# Patient Record
Sex: Female | Born: 1940 | Race: White | Hispanic: No | Marital: Married
Health system: Southern US, Community
[De-identification: ages and names within clinical notes are randomized; demographics above are authoritative.]

## PROBLEM LIST (undated history)

## (undated) DIAGNOSIS — M722 Plantar fascial fibromatosis: Secondary | ICD-10-CM

## (undated) DIAGNOSIS — E785 Hyperlipidemia, unspecified: Secondary | ICD-10-CM

## (undated) DIAGNOSIS — I10 Essential (primary) hypertension: Secondary | ICD-10-CM

## (undated) DIAGNOSIS — K219 Gastro-esophageal reflux disease without esophagitis: Secondary | ICD-10-CM

## (undated) DIAGNOSIS — J449 Chronic obstructive pulmonary disease, unspecified: Secondary | ICD-10-CM

## (undated) DIAGNOSIS — M1990 Unspecified osteoarthritis, unspecified site: Secondary | ICD-10-CM

## (undated) DIAGNOSIS — J45909 Unspecified asthma, uncomplicated: Secondary | ICD-10-CM

## (undated) DIAGNOSIS — M8580 Other specified disorders of bone density and structure, unspecified site: Secondary | ICD-10-CM

## (undated) DIAGNOSIS — H269 Unspecified cataract: Secondary | ICD-10-CM

## (undated) DIAGNOSIS — Z8679 Personal history of other diseases of the circulatory system: Secondary | ICD-10-CM

## (undated) DIAGNOSIS — G5600 Carpal tunnel syndrome, unspecified upper limb: Secondary | ICD-10-CM

## (undated) HISTORY — DX: Carpal tunnel syndrome, unspecified upper limb: G56.00

## (undated) HISTORY — DX: Chronic obstructive pulmonary disease, unspecified: J44.9

## (undated) HISTORY — DX: Unspecified osteoarthritis, unspecified site: M19.90

## (undated) HISTORY — DX: Hyperlipidemia, unspecified: E78.5

## (undated) HISTORY — PX: TONSILLECTOMY: SUR1361

## (undated) HISTORY — DX: Unspecified asthma, uncomplicated: J45.909

## (undated) HISTORY — DX: Personal history of other diseases of the circulatory system: Z86.79

## (undated) HISTORY — DX: Essential (primary) hypertension: I10

## (undated) HISTORY — DX: Other specified disorders of bone density and structure, unspecified site: M85.80

## (undated) HISTORY — PX: OTHER SURGICAL HISTORY: SHX169

## (undated) HISTORY — DX: Unspecified cataract: H26.9

## (undated) HISTORY — DX: Plantar fascial fibromatosis: M72.2

## (undated) HISTORY — DX: Gastro-esophageal reflux disease without esophagitis: K21.9

---

## 1998-03-28 ENCOUNTER — Emergency Department (HOSPITAL_COMMUNITY): Admission: EM | Admit: 1998-03-28 | Discharge: 1998-03-28 | Payer: Self-pay | Admitting: Emergency Medicine

## 1998-05-08 ENCOUNTER — Observation Stay (HOSPITAL_COMMUNITY): Admission: AD | Admit: 1998-05-08 | Discharge: 1998-05-09 | Payer: Self-pay | Admitting: Internal Medicine

## 1998-05-08 ENCOUNTER — Encounter: Payer: Self-pay | Admitting: Internal Medicine

## 1998-05-21 ENCOUNTER — Other Ambulatory Visit: Admission: RE | Admit: 1998-05-21 | Discharge: 1998-05-21 | Payer: Self-pay | Admitting: Obstetrics and Gynecology

## 1999-01-19 HISTORY — PX: BREAST BIOPSY: SHX20

## 1999-05-25 ENCOUNTER — Other Ambulatory Visit: Admission: RE | Admit: 1999-05-25 | Discharge: 1999-05-25 | Payer: Self-pay | Admitting: Obstetrics and Gynecology

## 2000-01-18 ENCOUNTER — Encounter: Admission: RE | Admit: 2000-01-18 | Discharge: 2000-01-18 | Payer: Self-pay | Admitting: Family Medicine

## 2000-01-18 ENCOUNTER — Encounter: Payer: Self-pay | Admitting: Family Medicine

## 2000-05-27 ENCOUNTER — Other Ambulatory Visit: Admission: RE | Admit: 2000-05-27 | Discharge: 2000-05-27 | Payer: Self-pay | Admitting: Obstetrics and Gynecology

## 2001-05-17 ENCOUNTER — Ambulatory Visit (HOSPITAL_COMMUNITY): Admission: RE | Admit: 2001-05-17 | Discharge: 2001-05-17 | Payer: Self-pay | Admitting: Gastroenterology

## 2001-05-17 ENCOUNTER — Encounter (INDEPENDENT_AMBULATORY_CARE_PROVIDER_SITE_OTHER): Payer: Self-pay | Admitting: Specialist

## 2001-05-17 HISTORY — PX: OTHER SURGICAL HISTORY: SHX169

## 2001-07-04 ENCOUNTER — Other Ambulatory Visit: Admission: RE | Admit: 2001-07-04 | Discharge: 2001-07-04 | Payer: Self-pay | Admitting: Obstetrics and Gynecology

## 2002-07-17 ENCOUNTER — Other Ambulatory Visit: Admission: RE | Admit: 2002-07-17 | Discharge: 2002-07-17 | Payer: Self-pay | Admitting: Obstetrics and Gynecology

## 2003-01-30 ENCOUNTER — Ambulatory Visit (HOSPITAL_COMMUNITY): Admission: RE | Admit: 2003-01-30 | Discharge: 2003-01-30 | Payer: Self-pay | Admitting: Orthopedic Surgery

## 2003-08-14 ENCOUNTER — Other Ambulatory Visit: Admission: RE | Admit: 2003-08-14 | Discharge: 2003-08-14 | Payer: Self-pay | Admitting: Obstetrics and Gynecology

## 2004-02-11 ENCOUNTER — Ambulatory Visit: Payer: Self-pay | Admitting: Internal Medicine

## 2004-02-19 ENCOUNTER — Ambulatory Visit: Payer: Self-pay | Admitting: Internal Medicine

## 2004-06-22 ENCOUNTER — Ambulatory Visit: Payer: Self-pay | Admitting: Internal Medicine

## 2004-07-27 ENCOUNTER — Ambulatory Visit: Payer: Self-pay | Admitting: Internal Medicine

## 2004-09-16 ENCOUNTER — Other Ambulatory Visit: Admission: RE | Admit: 2004-09-16 | Discharge: 2004-09-16 | Payer: Self-pay | Admitting: Obstetrics and Gynecology

## 2004-09-30 ENCOUNTER — Ambulatory Visit: Payer: Self-pay | Admitting: Internal Medicine

## 2004-11-18 ENCOUNTER — Ambulatory Visit: Payer: Self-pay | Admitting: Internal Medicine

## 2004-12-23 ENCOUNTER — Ambulatory Visit: Payer: Self-pay | Admitting: Internal Medicine

## 2004-12-29 ENCOUNTER — Ambulatory Visit: Payer: Self-pay | Admitting: Internal Medicine

## 2005-02-09 ENCOUNTER — Ambulatory Visit: Payer: Self-pay | Admitting: Internal Medicine

## 2005-05-11 ENCOUNTER — Ambulatory Visit: Payer: Self-pay | Admitting: Internal Medicine

## 2005-08-10 ENCOUNTER — Ambulatory Visit: Payer: Self-pay | Admitting: Internal Medicine

## 2005-10-01 ENCOUNTER — Other Ambulatory Visit: Admission: RE | Admit: 2005-10-01 | Discharge: 2005-10-01 | Payer: Self-pay | Admitting: Obstetrics and Gynecology

## 2005-10-05 ENCOUNTER — Ambulatory Visit: Payer: Self-pay | Admitting: Internal Medicine

## 2005-10-11 ENCOUNTER — Ambulatory Visit: Payer: Self-pay | Admitting: Internal Medicine

## 2005-10-29 ENCOUNTER — Ambulatory Visit: Payer: Self-pay | Admitting: Family Medicine

## 2006-01-05 ENCOUNTER — Ambulatory Visit: Payer: Self-pay | Admitting: Internal Medicine

## 2006-04-19 ENCOUNTER — Ambulatory Visit: Payer: Self-pay | Admitting: Internal Medicine

## 2006-08-09 DIAGNOSIS — M1990 Unspecified osteoarthritis, unspecified site: Secondary | ICD-10-CM | POA: Insufficient documentation

## 2006-08-09 DIAGNOSIS — J4489 Other specified chronic obstructive pulmonary disease: Secondary | ICD-10-CM | POA: Insufficient documentation

## 2006-08-09 DIAGNOSIS — J449 Chronic obstructive pulmonary disease, unspecified: Secondary | ICD-10-CM

## 2006-08-09 DIAGNOSIS — J4520 Mild intermittent asthma, uncomplicated: Secondary | ICD-10-CM

## 2006-08-09 DIAGNOSIS — K219 Gastro-esophageal reflux disease without esophagitis: Secondary | ICD-10-CM

## 2006-08-19 DIAGNOSIS — I10 Essential (primary) hypertension: Secondary | ICD-10-CM | POA: Insufficient documentation

## 2006-08-19 DIAGNOSIS — E785 Hyperlipidemia, unspecified: Secondary | ICD-10-CM

## 2006-08-22 ENCOUNTER — Ambulatory Visit: Payer: Self-pay | Admitting: Internal Medicine

## 2006-08-22 DIAGNOSIS — G5600 Carpal tunnel syndrome, unspecified upper limb: Secondary | ICD-10-CM

## 2006-08-22 LAB — CONVERTED CEMR LAB
BUN: 14 mg/dL (ref 6–23)
CO2: 29 meq/L (ref 19–32)
Calcium: 9.8 mg/dL (ref 8.4–10.5)
Chloride: 107 meq/L (ref 96–112)
Cholesterol: 147 mg/dL (ref 0–200)
Creatinine, Ser: 0.5 mg/dL (ref 0.4–1.2)
GFR calc Af Amer: 159 mL/min
GFR calc non Af Amer: 131 mL/min
Glucose, Bld: 67 mg/dL — ABNORMAL LOW (ref 70–99)
HDL: 53.2 mg/dL (ref >39.0)
Hgb A1c MFr Bld: 5.7 % (ref 4.6–6.0)
LDL Cholesterol: 77 mg/dL (ref 0–99)
Potassium: 4.5 meq/L (ref 3.5–5.1)
Sodium: 143 meq/L (ref 135–145)
Total CHOL/HDL Ratio: 2.8
Triglycerides: 84 mg/dL (ref 0–149)
VLDL: 17 mg/dL (ref 0–40)

## 2006-09-12 ENCOUNTER — Encounter: Payer: Self-pay | Admitting: Internal Medicine

## 2006-09-19 DIAGNOSIS — G5600 Carpal tunnel syndrome, unspecified upper limb: Secondary | ICD-10-CM

## 2006-09-19 HISTORY — DX: Carpal tunnel syndrome, unspecified upper limb: G56.00

## 2006-09-19 HISTORY — PX: CARPAL TUNNEL RELEASE: SHX101

## 2006-10-14 ENCOUNTER — Ambulatory Visit (HOSPITAL_BASED_OUTPATIENT_CLINIC_OR_DEPARTMENT_OTHER): Admission: RE | Admit: 2006-10-14 | Discharge: 2006-10-14 | Payer: Self-pay | Admitting: Orthopedic Surgery

## 2006-10-24 ENCOUNTER — Telehealth: Payer: Self-pay | Admitting: *Deleted

## 2006-11-10 ENCOUNTER — Ambulatory Visit: Payer: Self-pay | Admitting: Internal Medicine

## 2006-11-14 ENCOUNTER — Telehealth: Payer: Self-pay | Admitting: Internal Medicine

## 2006-11-17 ENCOUNTER — Encounter: Payer: Self-pay | Admitting: Internal Medicine

## 2006-11-18 ENCOUNTER — Other Ambulatory Visit: Admission: RE | Admit: 2006-11-18 | Discharge: 2006-11-18 | Payer: Self-pay | Admitting: Obstetrics and Gynecology

## 2006-11-21 ENCOUNTER — Encounter: Payer: Self-pay | Admitting: Internal Medicine

## 2006-11-23 ENCOUNTER — Encounter: Payer: Self-pay | Admitting: Internal Medicine

## 2006-11-30 ENCOUNTER — Ambulatory Visit: Payer: Self-pay | Admitting: Internal Medicine

## 2006-11-30 LAB — CONVERTED CEMR LAB: Hgb A1c MFr Bld: 6.3 % — ABNORMAL HIGH (ref 4.6–6.0)

## 2007-03-23 ENCOUNTER — Telehealth: Payer: Self-pay | Admitting: Internal Medicine

## 2007-03-27 ENCOUNTER — Ambulatory Visit: Payer: Self-pay | Admitting: Internal Medicine

## 2007-03-27 LAB — CONVERTED CEMR LAB
ALT: 15 units/L (ref 0–35)
AST: 21 units/L (ref 0–37)
Albumin: 3.7 g/dL (ref 3.5–5.2)
Alkaline Phosphatase: 60 units/L (ref 39–117)
Bilirubin, Direct: 0.1 mg/dL (ref 0.0–0.3)
Cholesterol: 116 mg/dL (ref 0–200)
HDL: 44.8 mg/dL (ref >39.0)
Hgb A1c MFr Bld: 6.5 % — ABNORMAL HIGH (ref 4.6–6.0)
LDL Cholesterol: 57 mg/dL (ref 0–99)
Total Bilirubin: 0.7 mg/dL (ref 0.3–1.2)
Total CHOL/HDL Ratio: 2.6
Total Protein: 6.5 g/dL (ref 6.0–8.3)
Triglycerides: 71 mg/dL (ref 0–149)
VLDL: 14 mg/dL (ref 0–40)

## 2007-04-19 ENCOUNTER — Ambulatory Visit: Payer: Self-pay | Admitting: Internal Medicine

## 2007-05-06 ENCOUNTER — Ambulatory Visit: Payer: Self-pay | Admitting: Family Medicine

## 2007-05-06 DIAGNOSIS — J209 Acute bronchitis, unspecified: Secondary | ICD-10-CM

## 2007-05-16 ENCOUNTER — Telehealth: Payer: Self-pay | Admitting: Internal Medicine

## 2007-05-30 ENCOUNTER — Ambulatory Visit (HOSPITAL_BASED_OUTPATIENT_CLINIC_OR_DEPARTMENT_OTHER): Admission: RE | Admit: 2007-05-30 | Discharge: 2007-05-30 | Payer: Self-pay | Admitting: Orthopedic Surgery

## 2007-07-03 ENCOUNTER — Telehealth: Payer: Self-pay | Admitting: Internal Medicine

## 2007-08-16 ENCOUNTER — Ambulatory Visit: Payer: Self-pay | Admitting: Internal Medicine

## 2007-08-16 LAB — CONVERTED CEMR LAB
BUN: 14 mg/dL (ref 6–23)
Basophils Absolute: 0 10*3/uL (ref 0.0–0.1)
Basophils Relative: 0.3 % (ref 0.0–3.0)
CO2: 28 meq/L (ref 19–32)
Calcium: 9.7 mg/dL (ref 8.4–10.5)
Chloride: 104 meq/L (ref 96–112)
Cholesterol: 105 mg/dL (ref 0–200)
Creatinine, Ser: 0.6 mg/dL (ref 0.4–1.2)
Eosinophils Absolute: 0.1 10*3/uL (ref 0.0–0.7)
Eosinophils Relative: 1 % (ref 0.0–5.0)
GFR calc Af Amer: 128 mL/min
GFR calc non Af Amer: 106 mL/min
Glucose, Bld: 80 mg/dL (ref 70–99)
HCT: 37 % (ref 36.0–46.0)
HDL: 51.8 mg/dL (ref >39.0)
Hemoglobin: 12.6 g/dL (ref 12.0–15.0)
Hgb A1c MFr Bld: 6.6 % — ABNORMAL HIGH (ref 4.6–6.0)
LDL Cholesterol: 44 mg/dL (ref 0–99)
Lymphocytes Relative: 29.6 % (ref 12.0–46.0)
MCHC: 34.2 g/dL (ref 30.0–36.0)
MCV: 92.1 fL (ref 78.0–100.0)
Monocytes Absolute: 0.5 10*3/uL (ref 0.1–1.0)
Monocytes Relative: 7.1 % (ref 3.0–12.0)
Neutro Abs: 4.5 10*3/uL (ref 1.4–7.7)
Neutrophils Relative %: 62 % (ref 43.0–77.0)
Platelets: 229 10*3/uL (ref 150–400)
Potassium: 4.1 meq/L (ref 3.5–5.1)
RBC: 4.02 M/uL (ref 3.87–5.11)
RDW: 13.5 % (ref 11.5–14.6)
Sodium: 140 meq/L (ref 135–145)
Total CHOL/HDL Ratio: 2
Triglycerides: 44 mg/dL (ref 0–149)
VLDL: 9 mg/dL (ref 0–40)
WBC: 7.2 10*3/uL (ref 4.5–10.5)

## 2007-09-19 ENCOUNTER — Telehealth: Payer: Self-pay | Admitting: Internal Medicine

## 2007-10-05 ENCOUNTER — Encounter: Payer: Self-pay | Admitting: Internal Medicine

## 2007-11-20 ENCOUNTER — Encounter: Payer: Self-pay | Admitting: Internal Medicine

## 2007-12-04 ENCOUNTER — Encounter: Payer: Self-pay | Admitting: Internal Medicine

## 2007-12-05 ENCOUNTER — Other Ambulatory Visit: Admission: RE | Admit: 2007-12-05 | Discharge: 2007-12-05 | Payer: Self-pay | Admitting: Obstetrics and Gynecology

## 2007-12-08 ENCOUNTER — Ambulatory Visit: Payer: Self-pay | Admitting: Internal Medicine

## 2007-12-08 DIAGNOSIS — L570 Actinic keratosis: Secondary | ICD-10-CM

## 2007-12-08 DIAGNOSIS — M722 Plantar fascial fibromatosis: Secondary | ICD-10-CM

## 2007-12-08 DIAGNOSIS — T887XXA Unspecified adverse effect of drug or medicament, initial encounter: Secondary | ICD-10-CM

## 2007-12-08 LAB — CONVERTED CEMR LAB
ALT: 20 units/L (ref 0–35)
AST: 25 units/L (ref 0–37)
Albumin: 4.2 g/dL (ref 3.5–5.2)
Alkaline Phosphatase: 71 units/L (ref 39–117)
BUN: 18 mg/dL (ref 6–23)
Basophils Absolute: 0 10*3/uL (ref 0.0–0.1)
Basophils Relative: 0.2 % (ref 0.0–3.0)
Bilirubin, Direct: 0.2 mg/dL (ref 0.0–0.3)
CO2: 29 meq/L (ref 19–32)
Calcium: 10.8 mg/dL — ABNORMAL HIGH (ref 8.4–10.5)
Chloride: 105 meq/L (ref 96–112)
Creatinine, Ser: 0.7 mg/dL (ref 0.4–1.2)
Eosinophils Absolute: 0 10*3/uL (ref 0.0–0.7)
Eosinophils Relative: 0.1 % (ref 0.0–5.0)
GFR calc Af Amer: 107 mL/min
GFR calc non Af Amer: 88 mL/min
Glucose, Bld: 145 mg/dL — ABNORMAL HIGH (ref 70–99)
HCT: 37.4 % (ref 36.0–46.0)
Hemoglobin: 12.9 g/dL (ref 12.0–15.0)
Hgb A1c MFr Bld: 6.6 % — ABNORMAL HIGH (ref 4.6–6.0)
Lymphocytes Relative: 14.6 % (ref 12.0–46.0)
MCHC: 34.6 g/dL (ref 30.0–36.0)
MCV: 92 fL (ref 78.0–100.0)
Monocytes Absolute: 0.5 10*3/uL (ref 0.1–1.0)
Monocytes Relative: 5.4 % (ref 3.0–12.0)
Neutro Abs: 8.1 10*3/uL — ABNORMAL HIGH (ref 1.4–7.7)
Neutrophils Relative %: 79.7 % — ABNORMAL HIGH (ref 43.0–77.0)
Platelets: 261 10*3/uL (ref 150–400)
Potassium: 4.2 meq/L (ref 3.5–5.1)
RBC: 4.07 M/uL (ref 3.87–5.11)
RDW: 13.3 % (ref 11.5–14.6)
Sodium: 145 meq/L (ref 135–145)
Total Bilirubin: 0.8 mg/dL (ref 0.3–1.2)
Total Protein: 7.2 g/dL (ref 6.0–8.3)
WBC: 10.1 10*3/uL (ref 4.5–10.5)

## 2007-12-18 ENCOUNTER — Encounter: Payer: Self-pay | Admitting: Internal Medicine

## 2008-01-19 DIAGNOSIS — M722 Plantar fascial fibromatosis: Secondary | ICD-10-CM

## 2008-01-19 HISTORY — DX: Plantar fascial fibromatosis: M72.2

## 2008-04-08 ENCOUNTER — Ambulatory Visit: Payer: Self-pay | Admitting: Internal Medicine

## 2008-04-08 DIAGNOSIS — M810 Age-related osteoporosis without current pathological fracture: Secondary | ICD-10-CM

## 2008-04-08 LAB — CONVERTED CEMR LAB
BUN: 15 mg/dL (ref 6–23)
CO2: 30 meq/L (ref 19–32)
Calcium: 10 mg/dL (ref 8.4–10.5)
Chloride: 106 meq/L (ref 96–112)
Cholesterol: 120 mg/dL (ref 0–200)
Creatinine, Ser: 0.6 mg/dL (ref 0.4–1.2)
Creatinine,U: 108.9 mg/dL
Direct LDL: 48.7 mg/dL
GFR calc non Af Amer: 105.48 mL/min (ref >60)
Glucose, Bld: 106 mg/dL — ABNORMAL HIGH (ref 70–99)
HDL: 57.2 mg/dL (ref >39.00)
Hgb A1c MFr Bld: 6.7 % — ABNORMAL HIGH (ref 4.6–6.5)
Microalb Creat Ratio: 5.5 mg/g (ref 0.0–30.0)
Microalb, Ur: 0.6 mg/dL (ref 0.0–1.9)
Potassium: 4.2 meq/L (ref 3.5–5.1)
Sodium: 145 meq/L (ref 135–145)
TSH: 1.61 microintl units/mL (ref 0.35–5.50)

## 2008-07-09 ENCOUNTER — Ambulatory Visit: Payer: Self-pay | Admitting: Internal Medicine

## 2008-10-14 ENCOUNTER — Telehealth: Payer: Self-pay | Admitting: Internal Medicine

## 2008-11-25 ENCOUNTER — Ambulatory Visit: Payer: Self-pay | Admitting: Internal Medicine

## 2008-11-25 ENCOUNTER — Encounter (INDEPENDENT_AMBULATORY_CARE_PROVIDER_SITE_OTHER): Payer: Self-pay | Admitting: *Deleted

## 2008-11-25 LAB — CONVERTED CEMR LAB
ALT: 19 units/L (ref 0–35)
AST: 19 units/L (ref 0–37)
Albumin: 3.9 g/dL (ref 3.5–5.2)
Alkaline Phosphatase: 58 units/L (ref 39–117)
BUN: 16 mg/dL (ref 6–23)
CO2: 28 meq/L (ref 19–32)
Chloride: 107 meq/L (ref 96–112)
Glucose, Bld: 105 mg/dL — ABNORMAL HIGH (ref 70–99)
Hgb A1c MFr Bld: 6.7 % — ABNORMAL HIGH (ref 4.6–6.5)
Potassium: 4.9 meq/L (ref 3.5–5.1)
Sodium: 144 meq/L (ref 135–145)
TSH: 2.11 microintl units/mL (ref 0.35–5.50)
Total Protein: 7.6 g/dL (ref 6.0–8.3)
VLDL: 11.4 mg/dL (ref 0.0–40.0)

## 2008-12-04 ENCOUNTER — Ambulatory Visit: Payer: Self-pay | Admitting: Internal Medicine

## 2009-04-02 ENCOUNTER — Ambulatory Visit: Payer: Self-pay | Admitting: Internal Medicine

## 2009-04-02 LAB — CONVERTED CEMR LAB: Hgb A1c MFr Bld: 6.8 % — ABNORMAL HIGH (ref 4.6–6.5)

## 2009-04-09 ENCOUNTER — Ambulatory Visit: Payer: Self-pay | Admitting: Internal Medicine

## 2009-04-09 ENCOUNTER — Telehealth: Payer: Self-pay | Admitting: Internal Medicine

## 2009-04-09 LAB — CONVERTED CEMR LAB
BUN: 13 mg/dL (ref 6–23)
GFR calc non Af Amer: 88.03 mL/min (ref >60)
Magnesium: 2 mg/dL (ref 1.5–2.5)
Potassium: 4.8 meq/L (ref 3.5–5.1)
Sodium: 142 meq/L (ref 135–145)

## 2009-08-06 ENCOUNTER — Encounter: Payer: Self-pay | Admitting: Internal Medicine

## 2009-10-06 ENCOUNTER — Ambulatory Visit: Payer: Self-pay | Admitting: Internal Medicine

## 2009-10-06 LAB — CONVERTED CEMR LAB
AST: 29 units/L (ref 0–37)
Albumin: 3.8 g/dL (ref 3.5–5.2)
Alkaline Phosphatase: 60 units/L (ref 39–117)
CO2: 29 meq/L (ref 19–32)
Calcium: 10.3 mg/dL (ref 8.4–10.5)
Cholesterol: 123 mg/dL (ref 0–200)
Glucose, Bld: 114 mg/dL — ABNORMAL HIGH (ref 70–99)
HDL: 47.5 mg/dL (ref >39.00)
Sodium: 142 meq/L (ref 135–145)
Total Protein: 6.5 g/dL (ref 6.0–8.3)

## 2009-10-13 ENCOUNTER — Ambulatory Visit: Payer: Self-pay | Admitting: Internal Medicine

## 2009-11-07 ENCOUNTER — Telehealth: Payer: Self-pay | Admitting: Internal Medicine

## 2009-11-27 ENCOUNTER — Encounter: Payer: Self-pay | Admitting: Internal Medicine

## 2009-12-23 ENCOUNTER — Ambulatory Visit: Payer: Self-pay | Admitting: Internal Medicine

## 2009-12-23 LAB — CONVERTED CEMR LAB: Hgb A1c MFr Bld: 7.7 % — ABNORMAL HIGH (ref 4.6–6.5)

## 2010-01-05 ENCOUNTER — Ambulatory Visit: Payer: Self-pay | Admitting: Internal Medicine

## 2010-01-05 DIAGNOSIS — E119 Type 2 diabetes mellitus without complications: Secondary | ICD-10-CM | POA: Insufficient documentation

## 2010-02-07 ENCOUNTER — Encounter: Payer: Self-pay | Admitting: Orthopedic Surgery

## 2010-02-19 NOTE — Progress Notes (Signed)
Summary: Pt called and said that she rcvd two orders of Lipitor  Phone Note Call from Patient Call back at Home Phone [phone]   Caller: Patient Reason for Call: Acute Illness Summary of Call: Pt called and said that she rcv a whole new order of Lipitor from [facility] and she called [facility] and was told, that Dr. Lovell Sheehan office sent in two scripts for this med. Pt is having to pay twice. Pls call.  Initial call taken by: Lucy Antigua,  November 07, 2009 3:53 PM  Follow-up for Phone Call        talked with pt Follow-up by: Willy Eddy, LPN,  November 07, 2009 4:11 PM

## 2010-02-19 NOTE — Letter (Signed)
Summary: Eye Exam/[facility]  Eye Exam/[facility]   Imported By: Maryln Gottron 12/03/2009 13:43:46  _____________________________________________________________________  External Attachment:    Type:   Image     Comment:   External Document

## 2010-02-19 NOTE — Assessment & Plan Note (Signed)
Summary: 3 month rov/njr   Vital Signs:  Patient profile:   70 year old female Height:      64 inches Weight:      202 pounds BMI:     34.80 Temp:     98.2 degrees F oral Pulse rate:   72 / minute Resp:     14 per minute BP sitting:   140 / 74  (left arm)  Vitals Entered By: Willy Eddy, LPN (January 05, 2010 10:47 AM) CC: roa labs, Type 2 diabetes mellitus follow-up Is Patient Diabetic? No   Primary Care Harvis Mabus:  Stacie Glaze MD  CC:  roa labs and Type 2 diabetes mellitus follow-up.  History of Present Illness: The pt presents for follow up for DM and HTN and asthma  Hyperlipidemia Follow-Up      This is a 70 year old woman who presents for Hyperlipidemia follow-up.  The patient denies muscle aches, GI upset, abdominal pain, flushing, itching, constipation, diarrhea, and fatigue.  The patient denies the following symptoms: chest pain/pressure, exercise intolerance, dypsnea, palpitations, syncope, and pedal edema.  Compliance with medications (by patient report) has been near 100%.  Dietary compliance has been good.  The patient reports exercising occasionally.  Adjunctive measures currently used by the patient include weight reduction.    Type 2 Diabetes Mellitus Follow-Up      The patient is also here for Type 2 diabetes mellitus follow-up.  The patient denies polyuria, polydipsia, blurred vision, self managed hypoglycemia, hypoglycemia requiring help, weight loss, weight gain, and numbness of extremities.  The patient denies the following symptoms: neuropathic pain, chest pain, vomiting, orthostatic symptoms, poor wound healing, intermittent claudication, vision loss, and foot ulcer.  Since the last visit the patient reports good dietary compliance, compliance with medications, and not exercising regularly.  Since the last visit, the patient reports having had eye care by an ophthalmologist.    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) 7)  Family History Diabetes 1st Degree Relative  (ICD-V18.0) 8)  Hypertension  (ICD-401.9) 9)  Hyperlipidemia  (ICD-272.4) 10)  Diabetes Mellitus, Type II  (ICD-250.00) 11)  Osteoarthritis  (ICD-715.90) 12)  Gerd  (ICD-530.81) 13)  COPD  (ICD-496) 14)  Asthma  (ICD-493.90)  Current Problems (verified): 1)  Osteopenia  (ICD-733.90) 2)  Uns Advrs Eff Uns Rx Medicinal&biological Sbstnc  (ICD-995.20) 3)  Actinic Keratosis  (ICD-702.0) 4)  Plantar Fasciitis, Left  (ICD-728.71) 5)  Acute Bronchitis  (ICD-466.0) 6)  Carpal Tunnel Syndrome, 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 One Puff Twice A Day 2)  Amaryl 2 Mg  Tabs (Glimepiride) .... One Twice A Day 3)  Zantac 150 Mg  Caps (Ranitidine Hcl) .... One Twice A Day 4)  Lipitor 20 Mg  Tabs (Atorvastatin  Calcium) .... Once Daily 5)  Vitamin D 78469 Unit  Caps (Ergocalciferol) .... Every Week 6)  Bayer Aspirin 325 Mg  Tabs (Aspirin) .... Once Daily 7)  Multivitamins   Tabs (Multiple Vitamin) .... Once Daily 8)  Caltrate 600+d 600-400 Mg-Unit  Tabs (Calcium Carbonate-Vitamin D) .... One Twice A Day 9)  Citrucel 500 Mg  Tabs (Methylcellulose (Laxative)) .... ,once Daily 10)  Fish Oil 1000 Mg  Caps (Omega-3 Fatty Acids) .... Two Times A Day 11)   Proair Hfa 108 (90 Base) Mcg/act  Aers (Albuterol Sulfate) .... Use As Directed 12)  Ramipril 10 Mg  Caps (Ramipril) .... One By Mouth Daily 13)  Janumet 50-1000 Mg Tabs (Sitagliptin-Metformin Hcl) .... One By Mouth Two Times A  Day (Replaces The Metformin and The Januvia) 14)  Potassium 99 Mg Tabs (Potassium) .... Two Times A Day  Current Medications (verified): 1)  Advair Diskus 100-50 Mcg/dose  Misc (Fluticasone-Salmeterol) .... One Puff Twice A Day 2)  Amaryl 2 Mg  Tabs (Glimepiride) .... One Twice A Day 3)  Zantac 150 Mg  Caps (Ranitidine Hcl) .... One Twice A Day 4)  Lipitor 20 Mg  Tabs (Atorvastatin Calcium) .... Once Daily 5)  Vitamin D 62952 Unit  Caps (Ergocalciferol) .... Every Week 6)  Bayer Aspirin 325 Mg  Tabs (Aspirin) .... Once Daily 7)  Multivitamins   Tabs (Multiple Vitamin) .... Once Daily 8)  Caltrate 600+d 600-400 Mg-Unit  Tabs (Calcium Carbonate-Vitamin D) .... One Twice A Day 9)  Citrucel 500 Mg  Tabs (Methylcellulose (Laxative)) .... ,once Daily 10)  Fish Oil 1000 Mg  Caps (Omega-3 Fatty Acids) .... Two Times A Day 11)  Proair Hfa 108 (90 Base) Mcg/act  Aers (Albuterol Sulfate) .... Use As Directed 12)  Ramipril 10 Mg  Caps (Ramipril) .... One By Mouth Daily 13)  Janumet 50-1000 Mg Tabs (Sitagliptin-Metformin Hcl) .... One By Mouth Two Times A  Day (Replaces The Metformin and The Januvia) 14)  Potassium 99 Mg Tabs (Potassium) .... Two Times A Day  Allergies (verified): 1)  ! Sulfa 2)  ! Codeine  Past History:  Family History: Last updated: 08/22/2006 father had MI at 47 Mother dies from CVD at 33 Family History Diabetes 1st degree relative  Social History: Last updated: 08/22/2006 Married Never Smoked  Risk Factors: Exercise: yes (11/30/2006)  Risk Factors: Smoking Status: never (01/05/2010)  Past medical, surgical, family and social histories (including risk factors) reviewed, and no changes noted (except as noted below).  Past Medical  History: Reviewed history from 08/22/2006 and no changes required. Asthma COPD GERD Osteoarthritis Diabetes mellitus, type II Hyperlipidemia Hypertension hx of AF  Past Surgical History: Reviewed history from 08/22/2006 and no changes required. Colonoscopy-05/17/2001 Tonsillectomy Oophorectomy cyst Cardiac ablation in2001 for AF  Family History: Reviewed history from 08/22/2006 and no changes required. father had MI at 9 Mother dies from CVD at 88 Family History Diabetes 1st degree relative  Social History: Reviewed history from 08/22/2006 and no changes required. Married Never Smoked  Review of Systems  The patient denies anorexia, fever, weight loss, weight gain, vision loss, decreased hearing, hoarseness, chest pain, syncope, dyspnea on exertion, peripheral edema, prolonged cough, headaches, hemoptysis, abdominal pain, melena, hematochezia, severe indigestion/heartburn, hematuria, incontinence, genital sores, muscle weakness, suspicious skin lesions, transient blindness, difficulty walking, depression, unusual weight change, abnormal bleeding, enlarged lymph nodes, angioedema, and breast masses.    Physical Exam  General:  Well-developed,well-nourished,in no acute distress; alert,appropriate and cooperative throughout examination Head:  Normocephalic  and atraumatic without obvious abnormalities. No apparent alopecia or balding. Eyes:  No corneal or conjunctival inflammation noted. EOMI. Perrla. Funduscopic exam benign, without hemorrhages, exudates or papilledema. Vision grossly normal. Ears:  R ear normal and L ear normal.   Nose:  no external deformity and no nasal discharge.   Mouth:  Oral mucosa and oropharynx without lesions or exudates.  Teeth in good repair. Neck:  No deformities, masses, or tenderness noted. Lungs:  soft wheezes and rhonchi, no rales Heart:  Normal rate and regular rhythm. S1 and S2 normal without gallop, murmur, click, rub or other extra  sounds. Abdomen:  soft, normal bowel sounds, and distended.  soft.   Msk:  No deformity or scoliosis noted of thoracic or lumbar spine.   Extremities:  No clubbing, cyanosis, edema, or deformity noted with normal full range of motion of all joints.    Diabetes Management Exam:    Eye Exam:       Eye Exam done elsewhere          Date: 11/27/2009          Results: cataract          Done by: shapiro   Impression & Recommendations:  Problem # 1:  DIAB W/OTH MANIFESTS TYPE II/UNS TYPE UNCNTRL (ICD-250.82) Assessment Improved  Her updated medication list for this problem includes:    Amaryl 2 Mg Tabs (Glimepiride) ..... One twice a day    Bayer Aspirin 325 Mg Tabs (Aspirin) ..... Once daily    Ramipril 10 Mg Caps (Ramipril) ..... One by mouth daily    Janumet 50-1000 Mg Tabs (Sitagliptin-metformin hcl) ..... One by mouth two times a  day (replaces the metformin and the [place])  Labs Reviewed: Creat: 0.7 (10/06/2009)     Last Eye Exam: cataract (11/27/2009) Reviewed HgBA1c results: 7.7 (12/23/2009)  8.3 (10/06/2009)  Problem # 2:  HYPERTENSION (ICD-401.9) Assessment: Unchanged  Her updated medication list for this problem includes:    Ramipril 10 Mg Caps (Ramipril) ..... One by mouth daily  BP today: 140/74 Prior BP: 138/74 (10/13/2009)  Prior 10 Yr Risk Heart Disease: 15 % (10/13/2009)  Labs Reviewed: K+: 4.7 (10/06/2009) Creat: : 0.7 (10/06/2009)   Chol: 123 (10/06/2009)   HDL: 47.50 (10/06/2009)   LDL: 56 (10/06/2009)   TG: 98.0 (10/06/2009)  Problem # 3:  HYPERLIPIDEMIA (ICD-272.4)  Her updated medication list for this problem includes:    Lipitor 20 Mg Tabs (Atorvastatin calcium) ..... Once daily  Labs Reviewed: SGOT: 29 (10/06/2009)   SGPT: 34 (10/06/2009)  Prior 10 Yr Risk Heart Disease: 15 % (10/13/2009)   HDL:47.50 (10/06/2009), 56.90 (11/25/2008)  LDL:56 (10/06/2009), 53 (11/25/2008)  Chol:123 (10/06/2009), 121 (11/25/2008)  Trig:98.0 (10/06/2009), 57.0  (11/25/2008)  Complete Medication List: 1)  Advair Diskus 100-50 Mcg/dose Misc (Fluticasone-salmeterol) .... One puff twice a day 2)  Amaryl 2 Mg Tabs (Glimepiride) .... One twice a day 3)  Zantac 150 Mg Caps (Ranitidine hcl) .... One twice a day 4)  Lipitor 20 Mg Tabs (Atorvastatin calcium) .... Once daily 5)  Vitamin D 15176 Unit Caps (Ergocalciferol) .... Every week 6)  Bayer Aspirin 325 Mg Tabs (Aspirin) .... Once daily 7)  Multivitamins Tabs (Multiple vitamin) .... Once daily 8)  Caltrate 600+d 600-400 Mg-unit Tabs (Calcium carbonate-vitamin d) .... One twice a day 9)  Citrucel 500 Mg Tabs (Methylcellulose (laxative)) .... ,once daily 10)  Fish Oil 1000 Mg Caps (Omega-3 fatty acids) .... Two times a day 11)  Proair Hfa 108 (90  Base) Mcg/act Aers (Albuterol sulfate) .... Use as directed 12)  Ramipril 10 Mg Caps (Ramipril) .... One by mouth daily 13)  Janumet 50-1000 Mg Tabs (Sitagliptin-metformin hcl) .... One by mouth two times a  day (replaces the metformin and the januvia) 14)  Potassium 99 Mg Tabs (Potassium) .... Two times a day  Patient Instructions: 1)  Please schedule a follow-up appointment in 4 months. 2)  HbgA1C prior to visit, ICD-9: 250.00 Prescriptions: PROAIR HFA 108 (90 BASE) MCG/ACT  AERS (ALBUTEROL SULFATE) Use as directed  #4 x 3   Entered and Authorized by:   Stacie Glaze MD   Signed by:   Stacie Glaze MD on 01/05/2010   Method used:   Print then Give to Patient   RxID:   [id number]

## 2010-02-19 NOTE — Medication Information (Signed)
Summary: Order for Diabetes Testing Supplies  Order for Diabetes Testing Supplies   Imported By: Maryln Gottron 08/08/2009 13:36:24  _____________________________________________________________________  External Attachment:    Type:   Image     Comment:   External Document

## 2010-02-19 NOTE — Progress Notes (Signed)
Summary: Pt req copy of lab works  Phone Note Call from Patient Call back at [employer] [phone]   Caller: Patient Summary of Call: Pt req to get copy of labs that were done. Initial call taken by: Lucy Antigua,  April 09, 2009 3:23 PM  Follow-up for Phone Call        pt told labs up front Follow-up by: Willy Eddy, LPN,  April 09, 2009 4:13 PM

## 2010-02-19 NOTE — Assessment & Plan Note (Signed)
Summary: 6 month follow up/cjr   Vital Signs:  Patient profile:   70 year old female Height:      64 inches Weight:      204 pounds BMI:     35.14 Temp:     98.2 degrees F oral Pulse rate:   72 / minute Pulse rhythm:   regular Resp:     14 per minute BP sitting:   138 / 74  (left arm)  Vitals Entered By: Willy Eddy, LPN (October 13, 2009 9:15 AM) CC: roa labs, Hypertension Management Is Patient Diabetic? Yes Did you bring your meter with you today? No   Primary Care Mahnoor Mathisen:  Stacie Glaze MD  CC:  roa labs and Hypertension Management.  History of Present Illness: pt recongnized that her diet has been poor she is abherant with medications she has been taking both her hypertension and lipids meds failthfully she has not symptoms to suggest increased CV risks   Follow-Up Visit      This is a 70 year old woman who presents for Follow-up visit.  The patient denies chest pain, palpitations, dizziness, syncope, low blood sugar symptoms, high blood sugar symptoms, edema, SOB, DOE, PND, and orthopnea.  Since the last visit the patient notes no new problems or concerns.  The patient reports taking meds as prescribed.  When questioned about possible medication side effects, the patient notes none.    Hypertension History:      She denies headache, chest pain, palpitations, dyspnea with exertion, orthopnea, PND, peripheral edema, visual symptoms, neurologic problems, syncope, and side effects from treatment.        Positive major cardiovascular risk factors include female age 67 years old or older, diabetes, hyperlipidemia, and hypertension.  Negative major cardiovascular risk factors include non-tobacco-user status.     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) 7)  Family History Diabetes 1st Degree Relative  (ICD-V18.0) 8)  Hypertension  (ICD-401.9) 9)  Hyperlipidemia  (ICD-272.4) 10)  Diabetes Mellitus, Type II  (ICD-250.00) 11)  Osteoarthritis  (ICD-715.90) 12)  Gerd  (ICD-530.81) 13)  COPD  (ICD-496) 14)  Asthma  (ICD-493.90)  Current Problems (verified): 1)  Osteopenia  (ICD-733.90) 2)  Uns Advrs Eff Uns Rx Medicinal&biological Sbstnc  (ICD-995.20) 3)  Actinic Keratosis  (ICD-702.0) 4)  Plantar Fasciitis, Left  (ICD-728.71) 5)  Acute Bronchitis  (ICD-466.0) 6)  Carpal Tunnel Syndrome, 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 One Puff Twice A Day 2)  Metformin Hcl 500 Mg  Tb24 (Metformin Hcl) .... One in Am and Two in Pm 3)  Amaryl 2 Mg  Tabs (Glimepiride) .... One Twice A Day 4)  Zantac 150 Mg  Caps (Ranitidine Hcl) .... One Twice A Day 5)  Lipitor 20 Mg  Tabs (Atorvastatin Calcium) .... Once Daily 6)  Vitamin D [address] Unit  Caps (Ergocalciferol) .... Every Week 7)  Bayer Aspirin 325 Mg  Tabs (Aspirin) .... Once Daily 8)  Multivitamins   Tabs (Multiple Vitamin) .... Once Daily 9)  Caltrate 600+d 600-400 Mg-Unit  Tabs (Calcium Carbonate-Vitamin D) .... One Twice A Day 10)  Citrucel 500 Mg  Tabs (Methylcellulose (Laxative)) .... ,once Daily 11)  Fish Oil 1000 Mg  Caps (Omega-3 Fatty Acids) .... Two Times A Day 12)  Proair Hfa 108 (90 Base) Mcg/act  Aers (Albuterol Sulfate) .... Use As Directed 13)  Ramipril 10 Mg   Caps (Ramipril) .... One By Mouth Daily 14)  Cvs Potassium Gluconate 2 Meq Tabs (Potassium Gluconate) .Marland Kitchen.. 1 Two Times A Day 15)  Januvia 100 Mg Tabs (Sitagliptin Phosphate) .... One By Mouth Dialy 16)  Potassium 99 Mg Tabs (Potassium) .... Two Times A Day  Current Medications (verified): 1)  Advair Diskus 100-50 Mcg/dose  Misc (Fluticasone-Salmeterol) .... One Puff Twice A Day 2)  Amaryl 2 Mg  Tabs (Glimepiride) .... One Twice A Day 3)  Zantac 150 Mg  Caps (Ranitidine Hcl) .... One Twice A Day 4)  Lipitor 20 Mg  Tabs (Atorvastatin Calcium) .... Once Daily 5)  Vitamin D 60454 Unit  Caps (Ergocalciferol) .... Every Week 6)  Bayer Aspirin 325 Mg  Tabs (Aspirin) .... Once Daily 7)  Multivitamins   Tabs (Multiple Vitamin) .... Once Daily 8)  Caltrate 600+d 600-400 Mg-Unit  Tabs (Calcium Carbonate-Vitamin D) .... One Twice A Day 9)  Citrucel 500 Mg  Tabs (Methylcellulose (Laxative)) .... ,once Daily 10)  Fish Oil 1000 Mg  Caps (Omega-3 Fatty Acids) .... Two Times A Day 11)  Proair Hfa 108 (90 Base) Mcg/act  Aers (Albuterol Sulfate) .... Use As Directed 12)  Ramipril 10 Mg  Caps (Ramipril) .... One By Mouth Daily 13)  Janumet 50-1000 Mg Tabs (Sitagliptin-Metformin Hcl) .... One By Mouth Two Times A  Day (Replaces The Metformin and The Januvia) 14)  Potassium 99 Mg Tabs (Potassium) .... Two Times A Day  Allergies (verified): 1)  ! Sulfa 2)  ! Codeine  Past History:  Family History: Last updated: 08/22/2006 father had MI at 21 Mother dies from CVD at 66 Family History Diabetes 1st degree relative  Social History: Last updated: 08/22/2006 Married Never Smoked  Risk Factors: Exercise: yes (11/30/2006)  Risk Factors: Smoking Status: never (10/13/2009)  Past medical, surgical, family and social histories (including risk factors) reviewed, and no changes noted (except as noted below).  Past Medical History: Reviewed history from 08/22/2006 and no changes  required. Asthma COPD GERD Osteoarthritis Diabetes mellitus, type II Hyperlipidemia Hypertension hx of AF  Past Surgical History: Reviewed history from 08/22/2006 and no changes required. Colonoscopy-05/17/2001 Tonsillectomy Oophorectomy cyst Cardiac ablation in2001 for AF  Family History: Reviewed history from 08/22/2006 and no changes required. father had MI at 57 Mother dies from CVD at 11 Family History Diabetes 1st degree relative  Social History: Reviewed history from 08/22/2006 and no changes required. Married Never Smoked  Review of Systems  The patient denies anorexia, fever, weight loss, weight gain, vision loss, decreased hearing, hoarseness, chest pain, syncope, dyspnea on exertion, peripheral edema, prolonged cough, headaches, hemoptysis, abdominal pain, melena, hematochezia, severe indigestion/heartburn, hematuria, incontinence, genital sores, muscle weakness, suspicious skin lesions, transient blindness, difficulty walking, depression, unusual weight change, abnormal bleeding, enlarged lymph nodes, angioedema, and breast masses.    Physical Exam  General:  Well-developed,well-nourished,in no acute distress;  alert,appropriate and cooperative throughout examination Head:  Normocephalic and atraumatic without obvious abnormalities. No apparent alopecia or balding. Eyes:  No corneal or conjunctival inflammation noted. EOMI. Perrla. Funduscopic exam benign, without hemorrhages, exudates or papilledema. Vision grossly normal. Ears:  R ear normal and L ear normal.   Nose:  no external deformity and no nasal discharge.   Mouth:  Oral mucosa and oropharynx without lesions or exudates.  Teeth in good repair. Neck:  No deformities, masses, or tenderness noted. Lungs:  soft wheezes and rhonchi, no rales Heart:  Normal rate and regular rhythm. S1 and S2 normal without gallop, murmur, click, rub or other extra sounds. Abdomen:  soft, normal bowel sounds, and distended.   soft.   Msk:  No deformity or scoliosis noted of thoracic or lumbar spine.   Pulses:  R and L carotid,radial,femoral,dorsalis pedis and posterior tibial pulses are full and equal bilaterally Extremities:  No clubbing, cyanosis, edema, or deformity noted with normal full range of motion of all joints.     Impression & Recommendations:  Problem # 1:  DIABETES MELLITUS, TYPE II (ICD-250.00) diet and travel paly a role will increased the metforming and for compliance simplify the medications The following medications were removed from the medication list:    Metformin Hcl 500 Mg Tb24 (Metformin hcl) ..... One in am and two in pm Her updated medication list for this problem includes:    Amaryl 2 Mg Tabs (Glimepiride) ..... One twice a day    Bayer Aspirin 325 Mg Tabs (Aspirin) ..... Once daily    Ramipril 10 Mg Caps (Ramipril) ..... One by mouth daily    Janumet 50-1000 Mg Tabs (Sitagliptin-metformin hcl) ..... One by mouth two times a  day (replaces the metformin and the [place])  Labs Reviewed: Creat: 0.7 (10/06/2009)     Last Eye Exam: normal (11/18/2008) Reviewed HgBA1c results: 8.3 (10/06/2009)  6.8 (04/02/2009)  Problem # 2:  HYPERLIPIDEMIA (ICD-272.4) Assessment: Improved  Her updated medication list for this problem includes:    Lipitor 20 Mg Tabs (Atorvastatin calcium) ..... Once daily  Labs Reviewed: SGOT: 29 (10/06/2009)   SGPT: 34 (10/06/2009)  10 Yr Risk Heart Disease: 15 % Prior 10 Yr Risk Heart Disease: 13 % (04/08/2008)   HDL:47.50 (10/06/2009), 56.90 (11/25/2008)  LDL:56 (10/06/2009), 53 (11/25/2008)  Chol:123 (10/06/2009), 121 (11/25/2008)  Trig:98.0 (10/06/2009), 57.0 (11/25/2008)  Problem # 3:  HYPERTENSION (ICD-401.9) Assessment: Unchanged  Her updated medication list for this problem includes:    Ramipril 10 Mg Caps (Ramipril) ..... One by mouth daily  BP today: 138/74 Prior BP: 130/72 (04/09/2009)  10 Yr Risk Heart Disease: 15 % Prior 10 Yr Risk  Heart Disease: 13 % (04/08/2008)  Labs Reviewed: K+: 4.7 (10/06/2009) Creat: : 0.7 (10/06/2009)   Chol: 123 (10/06/2009)   HDL: 47.50 (10/06/2009)   LDL: 56 (10/06/2009)   TG: 98.0 (10/06/2009)  Complete Medication List: 1)  Advair Diskus 100-50 Mcg/dose Misc (Fluticasone-salmeterol) .... One puff twice a day 2)  Amaryl 2 Mg Tabs (Glimepiride) .... One twice a day 3)  Zantac 150 Mg Caps (Ranitidine hcl) .... One twice a day 4)  Lipitor 20 Mg Tabs (Atorvastatin calcium) .... Once daily 5)  Vitamin D [address] Unit Caps (Ergocalciferol) .... Every week 6)  Bayer Aspirin 325 Mg Tabs (Aspirin) .... Once daily 7)  Multivitamins Tabs (Multiple vitamin) .... Once daily 8)  Caltrate 600+d 600-400 Mg-unit Tabs (Calcium carbonate-vitamin d) .... One twice a day 9)  Citrucel 500 Mg Tabs (Methylcellulose (laxative)) .... ,  once daily 10)  Fish Oil 1000 Mg Caps (Omega-3 fatty acids) .... Two times a day 11)  Proair Hfa 108 (90 Base) Mcg/act Aers (Albuterol sulfate) .... Use as directed 12)  Ramipril 10 Mg Caps (Ramipril) .... One by mouth daily 13)  Janumet 50-1000 Mg Tabs (Sitagliptin-metformin hcl) .... One by mouth two times a  day (replaces the metformin and the januvia) 14)  Potassium 99 Mg Tabs (Potassium) .... Two times a day  Hypertension Assessment/Plan:      The patient's hypertensive risk group is category C: Target organ damage and/or diabetes.  Her calculated 10 year risk of coronary heart disease is 15 %.  Today's blood pressure is 138/74.  Her blood pressure goal is < 130/80.  Patient Instructions: 1)  Please schedule a follow-up appointment in 3 months. 2)  HbgA1C prior to visit, ICD-9:250.00 Prescriptions: JANUMET 50-1000 MG TABS (SITAGLIPTIN-METFORMIN HCL) one by mouth two times a  day (replaces the metformin and the [place])  #180 x 3   Entered and Authorized by:   Stacie Glaze MD   Signed by:   Stacie Glaze MD on 10/13/2009   Method used:   Faxed to ...       [facility]  (mail-order)       [address] [place], [place]  [address]       Ph: [phone]       Fax: [phone]   RxID:   [id number]

## 2010-02-19 NOTE — Assessment & Plan Note (Signed)
Summary: 4 MONTH ROV/NJR   Vital Signs:  Patient profile:   70 year old female Height:      64 inches Weight:      206 pounds BMI:     35.49 Temp:     98.2 degrees F oral Pulse rate:   72 / minute Resp:     14 per minute BP sitting:   130 / 72  (left arm)  Vitals Entered By: Willy Eddy, LPN (April 09, 2009 9:20 AM)  Nutrition Counseling: Patient's BMI is greater than 25 and therefore counseled on weight management options. CC: roa labs, Hypertension Management   CC:  roa labs and Hypertension Management.  History of Present Illness: asthma, HTN , and DM stable   Hypertension History:      She denies headache, chest pain, palpitations, dyspnea with exertion, orthopnea, PND, peripheral edema, visual symptoms, neurologic problems, syncope, and side effects from treatment.  She notes no problems with any antihypertensive medication side effects.        Positive major cardiovascular risk factors include female age 33 years old or older, diabetes, hyperlipidemia, and hypertension.  Negative major cardiovascular risk factors include non-tobacco-user status.     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) 7)  Family History Diabetes 1st Degree Relative  (ICD-V18.0) 8)  Hypertension  (ICD-401.9) 9)  Hyperlipidemia  (ICD-272.4) 10)  Diabetes Mellitus, Type II  (ICD-250.00) 11)  Osteoarthritis  (ICD-715.90) 12)  Gerd  (ICD-530.81) 13)  COPD  (ICD-496) 14)  Asthma  (ICD-493.90)  Current Problems (verified): 1)  Osteopenia  (ICD-733.90) 2)  Uns Advrs Eff Uns Rx Medicinal&biological Sbstnc  (ICD-995.20) 3)  Actinic Keratosis  (ICD-702.0) 4)  Plantar  Fasciitis, Left  (ICD-728.71) 5)  Acute Bronchitis  (ICD-466.0) 6)  Carpal Tunnel Syndrome, 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 One Puff Twice A Day 2)  Fosamax 70 Mg  Tabs (Alendronate Sodium) .... One Weekly 3)  Metformin Hcl 500 Mg  Tb24 (Metformin Hcl) .... One in Am and Two in Pm 4)  Amaryl 2 Mg  Tabs (Glimepiride) .... One Twice A Day 5)  Zantac 150 Mg  Caps (Ranitidine Hcl) .... One Twice A Day 6)  Lipitor 20 Mg  Tabs (Atorvastatin Calcium) .... Once Daily 7)  Vitamin D 86578 Unit  Caps (Ergocalciferol) .... Every Week 8)  Bayer Aspirin 325 Mg  Tabs (Aspirin) .... Once Daily 9)  Multivitamins   Tabs (Multiple Vitamin) .... Once Daily 10)  Caltrate 600+d 600-400 Mg-Unit  Tabs (Calcium Carbonate-Vitamin D) .... One Twice A Day 11)  Citrucel 500 Mg  Tabs (Methylcellulose (Laxative)) .... ,once Daily 12)  Fish Oil 1000 Mg  Caps (Omega-3 Fatty Acids) .... Once Daily 13)  Proair Hfa 108 (90 Base) Mcg/act  Aers (Albuterol Sulfate) .... Use As Directed 14)  Ramipril 10 Mg  Caps (Ramipril) .... One By Mouth Daily 15)  Cvs Potassium Gluconate 2 Meq Tabs (Potassium Gluconate) .Marland Kitchen.. 1 Two Times A Day 16)  Januvia 100 Mg Tabs (Sitagliptin Phosphate) .... One By Mouth Dialy  Current Medications (verified): 1)  Advair Diskus 100-50 Mcg/dose  Misc (Fluticasone-Salmeterol) .... One Puff Twice A Day 2)  Metformin Hcl 500 Mg  Tb24 (Metformin Hcl) .... One in Am and Two in Pm 3)  Amaryl 2 Mg  Tabs (Glimepiride) .... One Twice A Day 4)  Zantac 150 Mg  Caps (Ranitidine Hcl) .... One Twice A Day 5)  Lipitor 20 Mg  Tabs (Atorvastatin Calcium) ....  Once Daily 6)  Vitamin D [address]109 Unit  Caps (Ergocalciferol) .... Every Week 7)  Bayer Aspirin 325 Mg  Tabs (Aspirin) .... Once Daily 8)  Multivitamins   Tabs (Multiple Vitamin) .... Once Daily 9)  Caltrate 600+d 600-400 Mg-Unit  Tabs (Calcium Carbonate-Vitamin D) .... One Twice A Day 10)  Citrucel 500 Mg  Tabs (Methylcellulose (Laxative)) .... ,once Daily 11)  Fish Oil 1000 Mg  Caps (Omega-3 Fatty Acids) .... Two Times A Day 12)  Proair Hfa 108 (90 Base) Mcg/act  Aers (Albuterol Sulfate) .... Use As Directed 13)  Ramipril 10 Mg  Caps (Ramipril) .... One By Mouth Daily 14)  Cvs Potassium Gluconate 2 Meq Tabs (Potassium Gluconate) .Marland Kitchen.. 1 Two Times A Day 15)  Januvia 100 Mg Tabs (Sitagliptin Phosphate) .... One By Mouth Dialy 16)  Potassium 99 Mg Tabs (Potassium) .... Two Times A Day  Allergies (verified): 1)  ! Sulfa 2)  ! Codeine  Past History:  Family History: Last updated: 08/22/2006 father had MI at 50 Mother dies from CVD at 37 Family History Diabetes 1st degree relative  Social History: Last updated: 08/22/2006 Married Never Smoked  Risk Factors: Exercise: yes (11/30/2006)  Risk Factors: Smoking Status: never (04/09/2009)  Past medical, surgical, family and social histories (including risk factors) reviewed, and no changes noted (except as noted below).  Past Medical History: Reviewed history from 08/22/2006 and no changes required. Asthma COPD GERD Osteoarthritis Diabetes mellitus, type II Hyperlipidemia Hypertension hx of AF  Past Surgical History: Reviewed history from 08/22/2006 and no changes required. Colonoscopy-05/17/2001 Tonsillectomy Oophorectomy cyst Cardiac ablation in2001 for AF  Family History: Reviewed history from 08/22/2006 and no changes required. father had MI at 33 Mother dies from CVD at 53 Family History Diabetes 1st degree relative  Social History: Reviewed history from 08/22/2006 and no changes required. Married Never  Smoked  Review of Systems  The patient denies anorexia, fever, weight loss, weight gain, vision loss, decreased hearing, hoarseness, chest pain, syncope, dyspnea on exertion, peripheral edema, prolonged cough, headaches, hemoptysis, abdominal pain, melena, hematochezia, severe indigestion/heartburn, hematuria, incontinence, genital sores, muscle weakness, suspicious skin lesions, transient blindness, difficulty walking, depression, unusual weight change, abnormal bleeding, enlarged lymph nodes, angioedema, breast masses, and testicular masses.    Physical Exam  General:  Well-developed,well-nourished,in no acute distress; alert,appropriate and cooperative throughout examination Head:  Normocephalic and atraumatic without obvious abnormalities. No apparent alopecia or balding. Eyes:  No corneal or conjunctival inflammation noted. EOMI. Perrla. Funduscopic exam benign, without hemorrhages, exudates or papilledema. Vision grossly normal. Ears:  R ear normal and L ear normal.   Nose:  no external deformity and no nasal discharge.   Mouth:  Oral mucosa and oropharynx without lesions or exudates.  Teeth in good repair. Neck:  No deformities, masses, or tenderness noted. Lungs:  soft wheezes and rhonchi, no rales Heart:  Normal rate and regular rhythm. S1 and S2 normal without gallop, murmur, click, rub or other extra sounds. Abdomen:  soft, normal bowel sounds, and distended.  soft.   Msk:  No deformity or scoliosis noted of thoracic or lumbar spine.   Pulses:  R and L carotid,radial,femoral,dorsalis pedis and posterior tibial pulses are full and equal bilaterally Extremities:  No clubbing, cyanosis, edema, or deformity noted with normal full range of motion of all joints.     Impression & Recommendations:  Problem # 1:  HYPERTENSION (ICD-401.9)  Her updated medication list for this problem includes:    Ramipril 10 Mg Caps (Ramipril) ..... One by mouth daily  BP today: 130/72 Prior BP:  136/60 (12/04/2008)  Prior 10 Yr Risk Heart Disease: 13 % (04/08/2008)  Labs Reviewed: K+: 4.9 (11/25/2008) Creat: : 0.7 (11/25/2008)   Chol: 121 (11/25/2008)   HDL: 56.90 (11/25/2008)   LDL: 53 (11/25/2008)   TG: 57.0 (11/25/2008)  Orders: Fingerstick ([address]) Venipuncture ([address]) TLB-BMP (Basic Metabolic Panel-BMET) (80048-METABOL) TLB-Calcium (82310-CA) TLB-Magnesium (Mg) (83735-MG)  Problem # 2:  HYPERLIPIDEMIA (ICD-272.4)  Her updated medication list for this problem includes:    Lipitor 20 Mg Tabs (Atorvastatin calcium) ..... Once daily  Labs Reviewed: SGOT: 19 (11/25/2008)   SGPT: 19 (11/25/2008)  Prior 10 Yr Risk Heart Disease: 13 % (04/08/2008)   HDL:56.90 (11/25/2008), 57.20 (04/08/2008)  LDL:53 (11/25/2008), 44 (08/16/2007)  Chol:121 (11/25/2008), 120 (04/08/2008)  Trig:57.0 (11/25/2008), 44 (08/16/2007)  Problem # 3:  COPD (ICD-496)  rare rescue use Her updated medication list for this problem includes:    Advair Diskus 100-50 Mcg/dose Misc (Fluticasone-salmeterol) ..... One puff twice a day    Proair Hfa 108 (90 Base) Mcg/act Aers (Albuterol sulfate) ..... Use as directed  Vaccines Reviewed: Flu Vax: Fluvax 3+ (11/10/2006)  Orders: Prescription Created Electronically [phone])  Complete Medication List: 1)  Advair Diskus 100-50 Mcg/dose Misc (Fluticasone-salmeterol) .... One puff twice a day 2)  Metformin Hcl 500 Mg Tb24 (Metformin hcl) .... One in am and two in pm 3)  Amaryl 2 Mg Tabs (Glimepiride) .... One twice a day 4)  Zantac 150 Mg Caps (Ranitidine hcl) .... One twice a day 5)  Lipitor 20 Mg Tabs (Atorvastatin calcium) .... Once daily 6)  Vitamin D 91478 Unit Caps (Ergocalciferol) .... Every week 7)  Bayer Aspirin 325 Mg Tabs (Aspirin) .... Once daily 8)  Multivitamins Tabs (Multiple vitamin) .... Once daily 9)  Caltrate 600+d 600-400 Mg-unit Tabs (Calcium carbonate-vitamin d) .... One twice a day 10)  Citrucel 500 Mg Tabs (Methylcellulose (laxative))  .... ,once daily 11)  Fish Oil 1000 Mg Caps (Omega-3 fatty acids) .... Two times a day 12)  Proair Hfa 108 (90 Base) Mcg/act Aers (Albuterol sulfate) .... Use as directed 13)  Ramipril 10 Mg Caps (Ramipril) .... One by mouth daily 14)  Cvs Potassium Gluconate 2 Meq Tabs (Potassium gluconate) .Marland Kitchen.. 1 two times a day 15)  Januvia 100 Mg Tabs (Sitagliptin phosphate) .... One by mouth dialy 16)  Potassium 99 Mg Tabs (Potassium) .... Two times a day  Hypertension Assessment/Plan:      The patient's hypertensive risk group is category C: Target organ damage and/or diabetes.  Her calculated 10 year risk of coronary heart disease is 13 %.  Today's blood pressure is 130/72.  Her blood pressure goal is < 130/80.  Patient Instructions: 1)  Please schedule a follow-up appointment in 6 months. 2)  BMP prior to visit, ICD-9:401.9 3)  Hepatic Panel prior to visit,  ICD-9:995.20 4)  Lipid Panel prior to visit, ICD-9:272.4 5)  TSH prior to visit, ICD-9:272.4 6)  HbgA1C prior to visit, ICD-9:250.00 Prescriptions: ADVAIR DISKUS 100-50 MCG/DOSE  MISC (FLUTICASONE-SALMETEROL) one puff twice a day  #3 x 3   Entered and Authorized by:   Stacie Glaze MD   Signed by:   Stacie Glaze MD on 04/09/2009   Method used:   Faxed to ...       [facility]       [address] [place], [place]  [address]       Ph: [phone]       Fax: [phone]   RxID:   [id number] JANUVIA 100 MG TABS (SITAGLIPTIN PHOSPHATE) one by mouth dialy  #90 x 3   Entered and Authorized by:   Stacie Glaze MD   Signed by:   Stacie Glaze MD on 04/09/2009   Method used:   Faxed to ...       [facility] ([facility]       [address] [place], [place]  [address]       Ph: [phone]       Fax: [phone]   RxID:   [id number] JANUVIA 100 MG TABS (SITAGLIPTIN PHOSPHATE) one by mouth dialy  #30 x 0   Entered and Authorized by:   Stacie Glaze MD   Signed by:   Stacie Glaze MD on 04/09/2009   Method  used:   Print then Give to Patient   RxID:   [id number]

## 2010-04-20 ENCOUNTER — Other Ambulatory Visit: Payer: Self-pay | Admitting: Internal Medicine

## 2010-04-27 ENCOUNTER — Other Ambulatory Visit (INDEPENDENT_AMBULATORY_CARE_PROVIDER_SITE_OTHER): Payer: Medicare Other

## 2010-04-27 LAB — HEMOGLOBIN A1C: Hgb A1c MFr Bld: 7.7 % — ABNORMAL HIGH (ref 4.6–6.5)

## 2010-05-05 ENCOUNTER — Encounter: Payer: Self-pay | Admitting: Internal Medicine

## 2010-05-07 ENCOUNTER — Other Ambulatory Visit: Payer: Self-pay

## 2010-05-14 ENCOUNTER — Ambulatory Visit (INDEPENDENT_AMBULATORY_CARE_PROVIDER_SITE_OTHER): Payer: Medicare Other | Admitting: Internal Medicine

## 2010-05-14 ENCOUNTER — Encounter: Payer: Self-pay | Admitting: Internal Medicine

## 2010-05-14 VITALS — BP 140/80 | HR 72 | Temp 98.8°F | Resp 14 | Ht 63.5 in | Wt 204.0 lb

## 2010-05-14 DIAGNOSIS — E1165 Type 2 diabetes mellitus with hyperglycemia: Secondary | ICD-10-CM

## 2010-05-14 DIAGNOSIS — E785 Hyperlipidemia, unspecified: Secondary | ICD-10-CM

## 2010-05-14 DIAGNOSIS — I10 Essential (primary) hypertension: Secondary | ICD-10-CM

## 2010-05-14 DIAGNOSIS — J449 Chronic obstructive pulmonary disease, unspecified: Secondary | ICD-10-CM

## 2010-05-14 NOTE — Progress Notes (Signed)
Subjective:    Patient ID: Denise [name], female    DOB: 02/04/1940, 70 y.o.   MRN: [id number]  HPI  The patient presents for followup of diabetes hypertension and asthmatic bronchitis and asthmatic bronchitis has been stable she has not been having to use her rescue inhaler she does use an inhaler prior to exercise or exertion. At that time.  She has stable asthma.  Her hemoglobin A1c is identical to what was 4 months ago showing static control of her diabetes but we would love to see the A1c less than 7 as it was several years ago.  In review of her chart we have noted that when her weight was around 200 her A1c is at best is a small weight loss to pursue and we'll set a goal of 5 pounds  Review of Systems  Constitutional: Negative for activity change, appetite change and fatigue.  HENT: Negative for ear pain, congestion, neck pain, postnasal drip and sinus pressure.   Eyes: Negative for redness and visual disturbance.  Respiratory: Negative for cough, shortness of breath and wheezing.   Gastrointestinal: Negative for abdominal pain and abdominal distention.  Genitourinary: Negative for dysuria, frequency and menstrual problem.  Musculoskeletal: Negative for myalgias, joint swelling and arthralgias.  Skin: Negative for rash and wound.  Neurological: Negative for dizziness, weakness and headaches.  Hematological: Negative for adenopathy. Does not bruise/bleed easily.  Psychiatric/Behavioral: Negative for sleep disturbance and decreased concentration.   Past Medical History  Diagnosis Date  . Asthma   . COPD (chronic obstructive pulmonary disease)   . GERD (gastroesophageal reflux disease)   . Arthritis   . Diabetes mellitus   . Hyperlipidemia   . Hypertension   . Hx of atrial fibrillation, no current medication    Past Surgical History  Procedure Date  . Colopnoscopy 05/17/2001  . Tonsillectomy   . Oophorectomy cyst   . Cardiac ablation in 2001 for a fib     reports  that she has never smoked. She does not have any smokeless tobacco history on file. She reports that she does not drink alcohol or use illicit drugs. family history includes Diabetes in an unspecified family member and Heart disease in her father and mother. Allergies  Allergen Reactions  . Codeine   . Sulfonamide Derivatives        Objective:   Physical Exam  Constitutional: She is oriented to person, place, and time. She appears well-developed and well-nourished. No distress.  HENT:  Head: Normocephalic and atraumatic.  Right Ear: External ear normal.  Left Ear: External ear normal.  Nose: Nose normal.  Mouth/Throat: Oropharynx is clear and moist.  Eyes: Conjunctivae and EOM are normal. Pupils are equal, round, and reactive to light.  Neck: Normal range of motion. Neck supple. No JVD present. No tracheal deviation present. No thyromegaly present.  Cardiovascular: Normal rate, regular rhythm, normal heart sounds and intact distal pulses.   No murmur heard. Pulmonary/Chest: Effort normal and breath sounds normal. She has no wheezes. She exhibits no tenderness.  Abdominal: Soft. Bowel sounds are normal.  Musculoskeletal: Normal range of motion. She exhibits no edema and no tenderness.  Lymphadenopathy:    She has no cervical adenopathy.  Neurological: She is alert and oriented to person, place, and time. She has normal reflexes. No cranial nerve deficit.  Skin: Skin is warm and dry. She is not diaphoretic.  Psychiatric: She has a normal mood and affect. Her behavior is normal.  Assessment & Plan:

## 2010-05-14 NOTE — Assessment & Plan Note (Signed)
She reports stable breathing with no use of her rescue inhaler

## 2010-05-14 NOTE — Assessment & Plan Note (Signed)
She is currently on  Janumet 50/1000 and Amaryl 2 mg twice a day and her A1c remains static at 7.7. In review of her past vital signs as compared to her A1c's it's apparent that her A1c is best when she is at the 200 pound mark therefore we will set a goal together of a 5 pound weight loss over the next 3 months to see if we can get that A1c did get below 7 at this point I would not add a long-acting insulin shot or change her orals

## 2010-05-19 ENCOUNTER — Encounter: Payer: Self-pay | Admitting: Internal Medicine

## 2010-06-02 NOTE — Op Note (Signed)
NAMESAIA, DEROSSETT               ACCOUNT NO.:  [id number]   MEDICAL RECORD NO.:  [id number]          PATIENT TYPE:  AMB   LOCATION:  DSC                          FACILITY:  [facility]   PHYSICIAN:  Katy Fitch. Sypher, M.D. DATE OF BIRTH:  1940-09-22   DATE OF PROCEDURE:  05/30/2007  DATE OF DISCHARGE:                               OPERATIVE REPORT   PREOPERATIVE DIAGNOSIS:  Entrapped neuropathy, median nerve, left carpal  tunnel.   POSTOP DIAGNOSIS:  Entrapped neuropathy, median nerve, left carpal  tunnel.   OPERATION:  Release of left transcarpal ligament.   OPERATING SURGEON:  Katy Fitch. Sypher, MD   ASSISTANT:  Marveen Reeks Dasnoit, PA-C   ANESTHESIA:  General by LMA.  Supervising anesthesiologist is Dr.  Noreene Larsson.   INDICATIONS:  Denise [name] is a well-known former patient who is status  post right carpal tunnel release.  She has had a history of long-  standing left hand numbness.  Previous electrodiagnostic studies  confirmed significant bilateral carpal tunnel syndrome.  She has had an  excellent response to surgery on the right.  She now presents for  similar surgery on the left.  Preoperative clinical examination  documented rather profound left thumb carpometacarpal joint arthrosis.  She understands that her arthritis pain will not be affected in a  positive manner by carpal tunnel release.  Our goal is to relieve the  numbness in her fingers that is troublesome in both daytime and  nighttime.   After informed consent, she is brought to the operating room at this  time.   PROCEDURE:  Denise [name] is brought to the operating room and placed in  supine position on the operating table.   Following the induction of general anesthesia by LMA technique, the left  arm was prepped with Betadine soap solution, sterilely draped.  A  pneumatic tourniquet was applied to the proximal brachium.   Following exsanguination of the left arm and the Esmarch bandage, the  arterial  tourniquet was inflated to 250 mmHg due to mildly elevated  systolic hypertension.   Procedure commenced with a short incision in the line of the ring finger  in the palm.  Subcutaneous tissues were carefully divided within the  palmar fascia.  This split longitudinally to the common sensory branch  of the median nerve and superficial palmar arch.  The common sensory  branch of the median nerve were followed back to transverse carpal  ligament which was gently isolated from median nerve.  The ligament was  then released along its ulnar border extending into the distal forearm.  This widely opened carpal canal.  The ulnar bursa was noted be fibrotic  and dry.  No masses or other pigments were noted.   Bleeding points along the margin of the released ligament were  electrocauterized bipolar current followed by repair of the skin with  intradermal 3-0 Prolene suture.  Lidocaine 2% was then infiltrated for  postop analgesia.  There were no apparent complications.      Katy Fitch Sypher, M.D.  Electronically Signed     RVS/MEDQ  D:  05/30/2007  T:  05/30/2007  Job:  [id number]   cc:   Stacie Glaze, MD

## 2010-06-02 NOTE — Op Note (Signed)
Denise [name], Denise [name]               ACCOUNT NO.:  [id number]   MEDICAL RECORD NO.:  [id number]          PATIENT TYPE:  AMB   LOCATION:  DSC                          FACILITY:  [facility]   PHYSICIAN:  Katy Fitch. Sypher, M.D. DATE OF BIRTH:  05-16-1940   DATE OF PROCEDURE:  10/14/2006  DATE OF DISCHARGE:                               OPERATIVE REPORT   PREOPERATIVE DIAGNOSIS:  Bilateral carpal tunnel syndrome associated  with type 2 diabetes.   POSTOPERATIVE DIAGNOSIS:  Bilateral carpal tunnel syndrome associated  with type 2 diabetes.   OPERATION:  1. Release of right transverse carpal ligament.  2. Injection of left ulnar bursa with Depo-Medrol and lidocaine.   OPERATING SURGEON:  Katy Fitch. Sypher, M.D.   ASSISTANT:  Molly Maduro Dasnoit PA-C.   ANESTHESIA:  General by LMA, supervising anesthesiologist is Quita Skye.  Krista Blue, M.D.   INDICATIONS:  Denise [name] is a 70 year old woman referred through the  courtesy of Dr. Darryll Capers of [place] for evaluation and  management of bilateral carpal tunnel syndrome.   She has a history of type 2 diabetes, well-managed on oral medication.  She was noted to have evidence of bilateral carpal tunnel syndrome and  was initially referred to see Dr. Meryl Crutch for electrodiagnostic studies.  These documented bilateral carpal tunnel syndrome.  She has had a series  of steroid injections into her ulnar bursae with transient relief.  She  reached a point where this was no longer effective; therefore, she was  referred for an upper extremity orthopedic consult.  Clinical  examination revealed evidence of carpal tunnel syndrome, right greater  than left.   After informed consent, she is brought to the operating at this time  anticipating release of her right transverse carpal ligament and an  injection into her left ulnar bursa.   After informed consent in the holding area during which questions were  invited and answered in detail, she is  brought to the operating room at  this time.   PROCEDURE:  Denise [name] is brought to the operating room and placed in  the supine position on the operating table.   Following the induction of general anesthesia by LMA technique, the  right arm was prepped with Betadine soap and solution and sterilely  draped.  A pneumatic tourniquet was applied to the proximal right  brachium.   Following exsanguination of the limb with an Esmarch bandage, the  arterial tourniquet was inflated to 230 mmHg.  While the right arm was  being prepped for surgery, we proceeded to paint the left wrist with  Betadine followed by injection of a mixture of 0.5 mL of Depo-Medrol 40  mg/mL and 1.5 mL of 1% lidocaine into the left ulnar bursa.  The fingers  were flexed, the needle inserted 1.5 cm proximal to the distal wrist  flexion crease in the line of the ring finger and after aspiration, the  steroid injected directly into the ulnar bursa with the fingers in  extension.   This was well-tolerated.   We then proceeded to exsanguinate the right arm with an Esmarch bandage  and inflate the arterial tourniquet on the proximal brachium to 230  mmHg.  The procedure commenced with a short incision in the line of the  ring finger in the palm.  The subcutaneous tissues were carefully  divided to reveal the palmar fascia.  This was split longitudinally to  reveal the common sensory branch of the median nerve.  These were  followed back to the median nerve proper, which was gently isolated from  the transverse carpal ligament with the use of a Agricultural engineer.  The ligament was then released along its ulnar border extending into the  distal forearm, followed by release of the volar forearm fascia.   There were no masses or other predicaments noted.   Bleeding points along the margin of the released ligament were  identified and electrocauterized.  The wound was then repaired with  intradermal 3-0 Prolene  suture.   The wound was dressed with Xeroflo, sterile gauze and a volar plaster  splint maintaining the wrist in 5 degrees of dorsiflexion.   Ms. Kiester tolerated the surgery and anesthesia well.  She was  transferred to the recovery room with stable vital signs.   For aftercare she is provided a prescription for Percocet 5 mg one p.o.  q.4-6h. p.r.n. pain 20 tablets without refill.      Katy Fitch Sypher, M.D.  Electronically Signed     RVS/MEDQ  D:  10/14/2006  T:  10/14/2006  Job:  [id number]   cc:   Stacie Glaze, MD

## 2010-06-17 ENCOUNTER — Other Ambulatory Visit: Payer: Self-pay | Admitting: Internal Medicine

## 2010-06-17 MED ORDER — FLUTICASONE-SALMETEROL 100-50 MCG/DOSE IN AEPB: 1.0000 | INHALATION_SPRAY | Freq: Two times a day (BID) | RESPIRATORY_TRACT | Status: DC

## 2010-06-17 NOTE — Telephone Encounter (Signed)
Refill Advair to [facility].

## 2010-06-23 ENCOUNTER — Encounter: Payer: Self-pay | Admitting: Internal Medicine

## 2010-08-12 ENCOUNTER — Other Ambulatory Visit (INDEPENDENT_AMBULATORY_CARE_PROVIDER_SITE_OTHER): Payer: Medicare Other

## 2010-08-12 DIAGNOSIS — IMO0002 Reserved for concepts with insufficient information to code with codable children: Secondary | ICD-10-CM

## 2010-08-12 DIAGNOSIS — E1165 Type 2 diabetes mellitus with hyperglycemia: Secondary | ICD-10-CM

## 2010-08-12 LAB — HEMOGLOBIN A1C: Hgb A1c MFr Bld: 7.6 % — ABNORMAL HIGH (ref 4.6–6.5)

## 2010-08-19 ENCOUNTER — Encounter: Payer: Self-pay | Admitting: Internal Medicine

## 2010-08-19 ENCOUNTER — Ambulatory Visit (INDEPENDENT_AMBULATORY_CARE_PROVIDER_SITE_OTHER): Payer: Medicare Other | Admitting: Internal Medicine

## 2010-08-19 VITALS — BP 154/80 | HR 68 | Temp 98.0°F | Resp 16 | Ht 63.5 in | Wt 198.0 lb

## 2010-08-19 DIAGNOSIS — E119 Type 2 diabetes mellitus without complications: Secondary | ICD-10-CM

## 2010-08-19 DIAGNOSIS — I10 Essential (primary) hypertension: Secondary | ICD-10-CM

## 2010-08-19 DIAGNOSIS — E1169 Type 2 diabetes mellitus with other specified complication: Secondary | ICD-10-CM

## 2010-08-19 DIAGNOSIS — E669 Obesity, unspecified: Secondary | ICD-10-CM

## 2010-08-19 MED ORDER — SAXAGLIPTIN-METFORMIN ER 5-1000 MG PO TB24
1.0000 | ORAL_TABLET | Freq: Every day | ORAL | Status: DC
Start: 2010-08-19 — End: 2010-08-19

## 2010-08-19 MED ORDER — SAXAGLIPTIN-METFORMIN ER 5-1000 MG PO TB24: 1.0000 | ORAL_TABLET | Freq: Every day | ORAL | Status: DC

## 2010-08-19 NOTE — Patient Instructions (Signed)
This new medication, kombiglize xr  Replaces the janumet It's one pill a day Take it in the morning with breakfast

## 2010-08-19 NOTE — Progress Notes (Signed)
Subjective:    Patient ID: Denise [name], female    DOB: Dec 30, 1940, 70 y.o.   MRN: [id number]  HPI The patient presents for followup of her diabetes. She does not do her fingersticks  On a regular basis but when she does them they have been 140-150  We reviewed her monitoring her diet and her exercise protocol we spent 30 minutes face-to-face and   Review of Systems  Constitutional: Negative for activity change, appetite change and fatigue.  HENT: Negative for ear pain, congestion, neck pain, postnasal drip and sinus pressure.   Eyes: Negative for redness and visual disturbance.  Respiratory: Negative for cough, shortness of breath and wheezing.   Gastrointestinal: Negative for abdominal pain and abdominal distention.  Genitourinary: Negative for dysuria, frequency and menstrual problem.  Musculoskeletal: Negative for myalgias, joint swelling and arthralgias.  Skin: Negative for rash and wound.  Neurological: Negative for dizziness, weakness and headaches.  Hematological: Negative for adenopathy. Does not bruise/bleed easily.  Psychiatric/Behavioral: Negative for sleep disturbance and decreased concentration.   Past Medical History  Diagnosis Date  . Asthma   . COPD (chronic obstructive pulmonary disease)   . GERD (gastroesophageal reflux disease)   . Arthritis   . Diabetes mellitus   . Hyperlipidemia   . Hypertension   . Hx of atrial fibrillation, no current medication    Past Surgical History  Procedure Date  . Colopnoscopy 05/17/2001  . Tonsillectomy   . Oophorectomy cyst   . Cardiac ablation in 2001 for a fib     reports that she has never smoked. She does not have any smokeless tobacco history on file. She reports that she does not drink alcohol or use illicit drugs. family history includes Diabetes in an unspecified family member and Heart disease in her father and mother. Allergies  Allergen Reactions  . Codeine   . Sulfonamide Derivatives           Objective:   Physical Exam  Nursing note and vitals reviewed. Constitutional: She is oriented to person, place, and time. She appears well-developed and well-nourished. No distress.  HENT:  Head: Normocephalic and atraumatic.  Right Ear: External ear normal.  Left Ear: External ear normal.  Nose: Nose normal.  Mouth/Throat: Oropharynx is clear and moist.  Eyes: Conjunctivae and EOM are normal. Pupils are equal, round, and reactive to light.  Neck: Normal range of motion. Neck supple. No JVD present. No tracheal deviation present. No thyromegaly present.  Cardiovascular: Normal rate, regular rhythm, normal heart sounds and intact distal pulses.   No murmur heard. Pulmonary/Chest: Effort normal and breath sounds normal. She has no wheezes. She exhibits no tenderness.  Abdominal: Soft. Bowel sounds are normal.  Musculoskeletal: Normal range of motion. She exhibits no edema and no tenderness.  Lymphadenopathy:    She has no cervical adenopathy.  Neurological: She is alert and oriented to person, place, and time. She has normal reflexes. No cranial nerve deficit.  Skin: Skin is warm and dry. She is not diaphoretic.  Psychiatric: She has a normal mood and affect. Her behavior is normal.          Assessment & Plan:  Patient presents for followup of her diabetes The patient has been watching what she said that her A1c had only a minor drop from 7.7-7.6 Blood pressure is slightly elevated today but repeat was 144/80 with documented white coat   To try to achieve an A1c less than 7 we will change the medication to [facility] She  will followup in 3 months with an A1c

## 2010-09-15 ENCOUNTER — Other Ambulatory Visit: Payer: Self-pay | Admitting: Internal Medicine

## 2010-10-14 ENCOUNTER — Ambulatory Visit (INDEPENDENT_AMBULATORY_CARE_PROVIDER_SITE_OTHER): Payer: Medicare Other

## 2010-10-14 DIAGNOSIS — Z23 Encounter for immunization: Secondary | ICD-10-CM

## 2010-10-19 ENCOUNTER — Other Ambulatory Visit: Payer: Self-pay | Admitting: Internal Medicine

## 2010-10-29 LAB — BASIC METABOLIC PANEL
BUN: 14
Calcium: 9.5
Creatinine, Ser: 0.74
GFR calc non Af Amer: >60

## 2010-10-29 LAB — POCT HEMOGLOBIN-HEMACUE: Operator id: 13849

## 2010-11-17 ENCOUNTER — Other Ambulatory Visit (INDEPENDENT_AMBULATORY_CARE_PROVIDER_SITE_OTHER): Payer: Medicare Other

## 2010-11-17 DIAGNOSIS — E1169 Type 2 diabetes mellitus with other specified complication: Secondary | ICD-10-CM

## 2010-11-17 DIAGNOSIS — E1165 Type 2 diabetes mellitus with hyperglycemia: Secondary | ICD-10-CM

## 2010-11-17 LAB — BASIC METABOLIC PANEL
BUN: 15 mg/dL (ref 6–23)
Calcium: 9.7 mg/dL (ref 8.4–10.5)
Creatinine, Ser: 0.5 mg/dL (ref 0.4–1.2)
GFR: 126.28 mL/min (ref >60.00)
Glucose, Bld: 127 mg/dL — ABNORMAL HIGH (ref 70–99)
Potassium: 4.8 mEq/L (ref 3.5–5.1)

## 2010-11-23 ENCOUNTER — Encounter: Payer: Self-pay | Admitting: Internal Medicine

## 2010-11-23 ENCOUNTER — Ambulatory Visit (INDEPENDENT_AMBULATORY_CARE_PROVIDER_SITE_OTHER): Payer: Medicare Other | Admitting: Internal Medicine

## 2010-11-23 DIAGNOSIS — E1165 Type 2 diabetes mellitus with hyperglycemia: Secondary | ICD-10-CM

## 2010-11-23 DIAGNOSIS — I10 Essential (primary) hypertension: Secondary | ICD-10-CM

## 2010-11-23 DIAGNOSIS — T887XXA Unspecified adverse effect of drug or medicament, initial encounter: Secondary | ICD-10-CM

## 2010-11-23 DIAGNOSIS — J45909 Unspecified asthma, uncomplicated: Secondary | ICD-10-CM

## 2010-11-23 DIAGNOSIS — K219 Gastro-esophageal reflux disease without esophagitis: Secondary | ICD-10-CM

## 2010-11-23 DIAGNOSIS — E1169 Type 2 diabetes mellitus with other specified complication: Secondary | ICD-10-CM

## 2010-11-23 DIAGNOSIS — J449 Chronic obstructive pulmonary disease, unspecified: Secondary | ICD-10-CM

## 2010-11-23 MED ORDER — SITAGLIPTIN PHOSPHATE 100 MG PO TABS: 100.0000 mg | ORAL_TABLET | Freq: Every day | ORAL | Status: DC

## 2010-11-23 MED ORDER — GLYBURIDE-METFORMIN 5-500 MG PO TABS: 1.0000 | ORAL_TABLET | Freq: Every day | ORAL | Status: DC

## 2010-11-23 NOTE — Progress Notes (Signed)
Subjective:    Patient ID: Denise [name], female    DOB: 06-20-1940, 70 y.o.   MRN: [id number]  HPI Patient presents for followup of her diabetes we did a recent medication change and her CBGs have increased into the 150 range.  When she resumed her genuine at the CBGs decreased.  She has also followed up at her diet in attempts to control her blood glucose.     Review of Systems  Constitutional: Negative for activity change, appetite change and fatigue.  HENT: Negative for ear pain, congestion, neck pain, postnasal drip and sinus pressure.   Eyes: Negative for redness and visual disturbance.  Respiratory: Negative for cough, shortness of breath and wheezing.   Gastrointestinal: Negative for abdominal pain and abdominal distention.  Genitourinary: Negative for dysuria, frequency and menstrual problem.  Musculoskeletal: Negative for myalgias, joint swelling and arthralgias.  Skin: Negative for rash and wound.  Neurological: Negative for dizziness, weakness and headaches.  Hematological: Negative for adenopathy. Does not bruise/bleed easily.  Psychiatric/Behavioral: Negative for sleep disturbance and decreased concentration.   Past Medical History  Diagnosis Date  . Asthma   . COPD (chronic obstructive pulmonary disease)   . GERD (gastroesophageal reflux disease)   . Arthritis   . Diabetes mellitus   . Hyperlipidemia   . Hypertension   . Hx of atrial fibrillation, no current medication    Past Surgical History  Procedure Date  . Colopnoscopy 05/17/2001  . Tonsillectomy   . Oophorectomy cyst   . Cardiac ablation in 2001 for a fib     reports that she has never smoked. She does not have any smokeless tobacco history on file. She reports that she does not drink alcohol or use illicit drugs. family history includes Diabetes in an unspecified family member and Heart disease in her father and mother. Allergies  Allergen Reactions  . Codeine   . Sulfonamide Derivatives         Objective:   Physical Exam  Nursing note reviewed. Constitutional: She is oriented to person, place, and time. She appears well-developed and well-nourished. No distress.  HENT:  Head: Normocephalic and atraumatic.  Right Ear: External ear normal.  Left Ear: External ear normal.  Nose: Nose normal.  Mouth/Throat: Oropharynx is clear and moist.  Eyes: Conjunctivae and EOM are normal. Pupils are equal, round, and reactive to light.  Neck: Normal range of motion. Neck supple. No JVD present. No tracheal deviation present. No thyromegaly present.  Cardiovascular: Normal rate, regular rhythm, normal heart sounds and intact distal pulses.   No murmur heard. Pulmonary/Chest: Effort normal and breath sounds normal. She has no wheezes. She exhibits no tenderness.  Abdominal: Soft. Bowel sounds are normal.  Musculoskeletal: Normal range of motion. She exhibits no edema and no tenderness.  Lymphadenopathy:    She has no cervical adenopathy.  Neurological: She is alert and oriented to person, place, and time. She has normal reflexes. No cranial nerve deficit.  Skin: Skin is warm and dry. She is not diaphoretic.  Psychiatric: She has a normal mood and affect. Her behavior is normal.          Assessment & Plan:  Failure of new medication with elevated blood glucoses and increasing A1c we discussed the resumption of a combination of metformin and a sulfonylurea as well as 100 mg daily via for better control and cost effectiveness.  Patient's blood pressure is stable her asthma is stable she is given samples of her asthma control medication she has  not had to use her rescue inhalers.  She'll follow up in 3 months time

## 2010-11-23 NOTE — Patient Instructions (Signed)
Take Glucovance twice a day which includes both the metformin and sulfonylurea And take januvia 100 mg once a day

## 2010-11-24 ENCOUNTER — Other Ambulatory Visit: Payer: Self-pay | Admitting: *Deleted

## 2010-11-24 DIAGNOSIS — E1165 Type 2 diabetes mellitus with hyperglycemia: Secondary | ICD-10-CM

## 2010-11-27 ENCOUNTER — Other Ambulatory Visit: Payer: Self-pay | Admitting: Internal Medicine

## 2010-11-27 MED ORDER — GLYBURIDE-METFORMIN 5-500 MG PO TABS: 1.0000 | ORAL_TABLET | Freq: Two times a day (BID) | ORAL | Status: DC

## 2010-11-27 NOTE — Telephone Encounter (Signed)
Resent medication

## 2010-11-27 NOTE — Telephone Encounter (Signed)
Pharmacy called needing clarification glyBURIDE-metformin (GLUCOVANCE) 5-500 MG per tablet  directions. Pls call.

## 2010-12-19 DIAGNOSIS — H269 Unspecified cataract: Secondary | ICD-10-CM

## 2010-12-19 HISTORY — DX: Unspecified cataract: H26.9

## 2011-01-17 ENCOUNTER — Other Ambulatory Visit: Payer: Self-pay | Admitting: Internal Medicine

## 2011-02-22 LAB — HM MAMMOGRAPHY: HM Mammogram: NEGATIVE

## 2011-03-05 ENCOUNTER — Telehealth: Payer: Self-pay | Admitting: Internal Medicine

## 2011-03-05 ENCOUNTER — Other Ambulatory Visit: Payer: Medicare Other

## 2011-03-05 MED ORDER — METFORMIN HCL 500 MG PO TABS: 500.0000 mg | ORAL_TABLET | Freq: Two times a day (BID) | ORAL | Status: DC

## 2011-03-05 MED ORDER — GLYBURIDE 5 MG PO TABS: 5.0000 mg | ORAL_TABLET | Freq: Two times a day (BID) | ORAL | Status: DC

## 2011-03-05 NOTE — Telephone Encounter (Signed)
Pt would like bonnye to return her call concerning dm med

## 2011-03-05 NOTE — Telephone Encounter (Signed)
Requesting samples of glucovance 5-500-none available-- broke up to metofrmin and glyburide and called to [facility] which is free

## 2011-03-12 ENCOUNTER — Ambulatory Visit: Payer: Medicare Other | Admitting: Internal Medicine

## 2011-03-16 ENCOUNTER — Other Ambulatory Visit (INDEPENDENT_AMBULATORY_CARE_PROVIDER_SITE_OTHER): Payer: Medicare Other

## 2011-03-16 DIAGNOSIS — E1165 Type 2 diabetes mellitus with hyperglycemia: Secondary | ICD-10-CM

## 2011-03-16 DIAGNOSIS — E1169 Type 2 diabetes mellitus with other specified complication: Secondary | ICD-10-CM

## 2011-03-16 DIAGNOSIS — IMO0002 Reserved for concepts with insufficient information to code with codable children: Secondary | ICD-10-CM

## 2011-03-23 ENCOUNTER — Encounter: Payer: Self-pay | Admitting: Internal Medicine

## 2011-03-23 ENCOUNTER — Ambulatory Visit (INDEPENDENT_AMBULATORY_CARE_PROVIDER_SITE_OTHER): Payer: Medicare Other | Admitting: Internal Medicine

## 2011-03-23 VITALS — BP 140/70 | HR 76 | Temp 98.3°F | Resp 16 | Ht 63.5 in | Wt 200.0 lb

## 2011-03-23 DIAGNOSIS — E1169 Type 2 diabetes mellitus with other specified complication: Secondary | ICD-10-CM

## 2011-03-23 DIAGNOSIS — E1165 Type 2 diabetes mellitus with hyperglycemia: Secondary | ICD-10-CM

## 2011-03-23 DIAGNOSIS — IMO0002 Reserved for concepts with insufficient information to code with codable children: Secondary | ICD-10-CM

## 2011-03-23 MED ORDER — LINAGLIPTIN-METFORMIN HCL 2.5-1000 MG PO TABS: 1.0000 | ORAL_TABLET | Freq: Two times a day (BID) | ORAL | Status: DC

## 2011-03-23 MED ORDER — SITAGLIPTIN PHOSPHATE 100 MG PO TABS: 100.0000 mg | ORAL_TABLET | Freq: Every day | ORAL | Status: DC

## 2011-03-23 NOTE — Patient Instructions (Addendum)
The patient is instructed to continue all medications as prescribed. Schedule followup with check out clerk upon leaving the clinic Stop the  metformin and stop the Januvia

## 2011-03-23 NOTE — Progress Notes (Signed)
Subjective:    Patient ID: Denise [name], female    DOB: 03-Jul-1940, 71 y.o.   MRN: [id number]  HPI  Has been walking on the treadmill daily and has tried to eliminate sugar with the "sugar free" choices Has not follow  a good DM diet. Is very annoyed that this diet has not worked.  CBGS are elevated     Review of Systems  Constitutional: Negative for activity change, appetite change and fatigue.  HENT: Negative for ear pain, congestion, neck pain, postnasal drip and sinus pressure.   Eyes: Negative for redness and visual disturbance.  Respiratory: Negative for cough, shortness of breath and wheezing.   Gastrointestinal: Negative for abdominal pain and abdominal distention.  Genitourinary: Negative for dysuria, frequency and menstrual problem.  Musculoskeletal: Negative for myalgias, joint swelling and arthralgias.  Skin: Negative for rash and wound.  Neurological: Negative for dizziness, weakness and headaches.  Hematological: Negative for adenopathy. Does not bruise/bleed easily.  Psychiatric/Behavioral: Negative for sleep disturbance and decreased concentration.   Past Medical History  Diagnosis Date  . Asthma   . COPD (chronic obstructive pulmonary disease)   . GERD (gastroesophageal reflux disease)   . Arthritis   . Diabetes mellitus   . Hyperlipidemia   . Hypertension   . Hx of atrial fibrillation, no current medication     History   Social History  . Marital Status: Married    Spouse Name: N/A    Number of Children: N/A  . Years of Education: N/A   Occupational History  . Not on file.   Social History Main Topics  . Smoking status: Never Smoker   . Smokeless tobacco: Not on file  . Alcohol Use: No  . Drug Use: No  . Sexually Active: Yes   Other Topics Concern  . Not on file   Social History Narrative  . No narrative on file    Past Surgical History  Procedure Date  . Colopnoscopy 05/17/2001  . Tonsillectomy   . Oophorectomy cyst   .  Cardiac ablation in 2001 for a fib     Family History  Problem Relation Age of Onset  . Diabetes    . Heart disease Mother   . Heart disease Father     Allergies  Allergen Reactions  . Codeine   . Sulfonamide Derivatives     Current Outpatient Prescriptions on File Prior to Visit  Medication Sig Dispense Refill  . ADVAIR DISKUS 100-50 MCG/DOSE AEPB USE 1 INHALATION ORALLY    EVERY 12 HOURS  180 each  3  . albuterol (PROVENTIL HFA;VENTOLIN HFA) 108 (90 BASE) MCG/ACT inhaler Inhale 2 puffs into the lungs every 6 (six) hours as needed.        Marland Kitchen aspirin 325 MG tablet Take 325 mg by mouth daily.        Marland Kitchen atorvastatin (LIPITOR) 20 MG tablet TAKE 1 TABLET ONCE DAILY  90 tablet  0  . Calcium Carbonate-Vitamin D (CALTRATE 600+D PO) Take by mouth 2 (two) times daily.        . ergocalciferol (VITAMIN D2) 50000 UNITS capsule Take 50,000 Units by mouth. Every other week      . fish oil-omega-3 fatty acids 1000 MG capsule Take 2 g by mouth daily.        Marland Kitchen glyBURIDE (DIABETA) 5 MG tablet Take 1 tablet (5 mg total) by mouth 2 (two) times daily.  60 tablet  3  . metFORMIN (GLUCOPHAGE) 500 MG tablet Take 1 tablet (  500 mg total) by mouth 2 (two) times daily with a meal.  60 tablet  3  . methylcellulose (CITRUCEL FIBER LAXATIVE) packet Take by mouth 2 (two) times daily.       . multivitamin (THERAGRAN) per tablet Take 1 tablet by mouth daily.        . Potassium 99 MG TABS Take by mouth 2 (two) times daily.        . ramipril (ALTACE) 10 MG capsule TAKE 1 CAPSULE DAILY  90 capsule  3  . ranitidine (ZANTAC) 150 MG tablet TAKE ONE TABLET BY MOUTH TWICE DAILY  180 tablet  3    BP 140/70  Pulse 76  Temp 98.3 F (36.8 C)  Resp 16  Ht 5' 3.5" (1.613 m)  Wt 200 lb (90.719 kg)  BMI 34.87 kg/m2        Objective:   Physical Exam  Constitutional: She is oriented to person, place, and time. She appears well-developed and well-nourished. No distress.  HENT:  Head: Normocephalic and atraumatic.    Right Ear: External ear normal.  Left Ear: External ear normal.  Nose: Nose normal.  Mouth/Throat: Oropharynx is clear and moist.  Eyes: Conjunctivae and EOM are normal. Pupils are equal, round, and reactive to light.  Neck: Normal range of motion. Neck supple. No JVD present. No tracheal deviation present. No thyromegaly present.  Cardiovascular: Normal rate, regular rhythm, normal heart sounds and intact distal pulses.   No murmur heard. Pulmonary/Chest: Effort normal and breath sounds normal. She has no wheezes. She exhibits no tenderness.  Abdominal: Soft. Bowel sounds are normal.  Musculoskeletal: Normal range of motion. She exhibits no edema and no tenderness.  Lymphadenopathy:    She has no cervical adenopathy.  Neurological: She is alert and oriented to person, place, and time. She has normal reflexes. No cranial nerve deficit.  Skin: Skin is warm and dry. She is not diaphoretic.  Psychiatric: She has a normal mood and affect. Her behavior is normal.          Assessment & Plan:  Reviewed diabetic diet and I believe she is consuming too many carbohydrates with "sugarfree" foods we discussed dietary guidelines for diabetes gave her information including a focal carbohydrate counting and a meal planner for diabetes.  We will also change her med Sherron Monday and her Januvia to a combination drug with more metformin.  She is to monitor blood sugar and we will bring her back in one month's time to measure a hemoglobin A1c and a fasting work reviewed with her glucometer.  Her asthma is stable her lipids have been stable and her blood pressure is stable.

## 2011-04-23 ENCOUNTER — Other Ambulatory Visit: Payer: Self-pay | Admitting: *Deleted

## 2011-04-23 ENCOUNTER — Telehealth: Payer: Self-pay | Admitting: *Deleted

## 2011-04-23 ENCOUNTER — Encounter: Payer: Self-pay | Admitting: Internal Medicine

## 2011-04-23 ENCOUNTER — Ambulatory Visit (INDEPENDENT_AMBULATORY_CARE_PROVIDER_SITE_OTHER): Payer: Medicare Other | Admitting: Internal Medicine

## 2011-04-23 VITALS — BP 140/80 | HR 76 | Temp 98.2°F | Resp 16 | Ht 62.5 in | Wt 198.0 lb

## 2011-04-23 DIAGNOSIS — E1165 Type 2 diabetes mellitus with hyperglycemia: Secondary | ICD-10-CM

## 2011-04-23 DIAGNOSIS — E1169 Type 2 diabetes mellitus with other specified complication: Secondary | ICD-10-CM

## 2011-04-23 DIAGNOSIS — I10 Essential (primary) hypertension: Secondary | ICD-10-CM

## 2011-04-23 DIAGNOSIS — E785 Hyperlipidemia, unspecified: Secondary | ICD-10-CM

## 2011-04-23 DIAGNOSIS — R110 Nausea: Secondary | ICD-10-CM

## 2011-04-23 MED ORDER — RAMIPRIL 10 MG PO CAPS: 10.0000 mg | ORAL_CAPSULE | Freq: Every day | ORAL | Status: DC

## 2011-04-23 MED ORDER — FLUTICASONE-SALMETEROL 100-50 MCG/DOSE IN AEPB: 1.0000 | INHALATION_SPRAY | Freq: Two times a day (BID) | RESPIRATORY_TRACT | Status: DC

## 2011-04-23 NOTE — Progress Notes (Signed)
Subjective:    Patient ID: Denise [name], female    DOB: [birth year], 71 y.o.   MRN: [id number]  HPI Patient is a 71 year old female who presents for followup of diabetes hypertension and hyperlipidemia.  She had been doing quite well until recently when she had an episode of repetitive nausea.  She had episodes lasting 5-10 minutes over several occasions on one day she does not recall any diaphoresis associated with them she did not have any chest pain or shortness of breath her husband was with her and he is with her again today and reports that those symptoms were as reported. The symptoms abated by that evening but since then she has noted elevated blood glucoses that are not the norm as high as the upper 190's. Of concern is that the blood shoot glucoses have remained elevated above normal.   Review of Systems  Constitutional: Negative for activity change, appetite change and fatigue.  HENT: Negative for ear pain, congestion, neck pain, postnasal drip and sinus pressure.   Eyes: Negative for redness and visual disturbance.  Respiratory: Negative for cough, shortness of breath and wheezing.   Gastrointestinal: Negative for abdominal pain and abdominal distention.  Genitourinary: Negative for dysuria, frequency and menstrual problem.  Musculoskeletal: Negative for myalgias, joint swelling and arthralgias.  Skin: Negative for rash and wound.  Neurological: Negative for dizziness, weakness and headaches.  Hematological: Negative for adenopathy. Does not bruise/bleed easily.  Psychiatric/Behavioral: Negative for sleep disturbance and decreased concentration.   Past Medical History  Diagnosis Date  . Asthma   . COPD (chronic obstructive pulmonary disease)   . GERD (gastroesophageal reflux disease)   . Arthritis   . Diabetes mellitus   . Hyperlipidemia   . Hypertension   . Hx of atrial fibrillation, no current medication     History   Social History  . Marital Status: Married      Spouse Name: N/A    Number of Children: N/A  . Years of Education: N/A   Occupational History  . Not on file.   Social History Main Topics  . Smoking status: Never Smoker   . Smokeless tobacco: Not on file  . Alcohol Use: No  . Drug Use: No  . Sexually Active: Yes   Other Topics Concern  . Not on file   Social History Narrative  . No narrative on file    Past Surgical History  Procedure Date  . Colopnoscopy 05/17/2001  . Tonsillectomy   . Oophorectomy cyst   . Cardiac ablation in 2001 for a fib     Family History  Problem Relation Age of Onset  . Diabetes    . Heart disease Mother   . Heart disease Father     Allergies  Allergen Reactions  . Codeine   . Sulfonamide Derivatives     Current Outpatient Prescriptions on File Prior to Visit  Medication Sig Dispense Refill  . albuterol (PROVENTIL HFA;VENTOLIN HFA) 108 (90 BASE) MCG/ACT inhaler Inhale 2 puffs into the lungs every 6 (six) hours as needed.        Marland Kitchen aspirin 325 MG tablet Take 325 mg by mouth daily.        Marland Kitchen atorvastatin (LIPITOR) 20 MG tablet TAKE 1 TABLET ONCE DAILY  90 tablet  0  . Calcium Carbonate-Vitamin D (CALTRATE 600+D PO) Take by mouth 2 (two) times daily.        . ergocalciferol (VITAMIN D2) 50000 UNITS capsule Take 50,000 Units by mouth. Every other  week      . fish oil-omega-3 fatty acids 1000 MG capsule Take 2 g by mouth daily.        Marland Kitchen glyBURIDE (DIABETA) 5 MG tablet Take 1 tablet (5 mg total) by mouth 2 (two) times daily.  60 tablet  3  . Linagliptin-Metformin HCl (JENTADUETO) 2.05-998 MG TABS Take 1 tablet by mouth 2 (two) times daily.  60 tablet    . methylcellulose (CITRUCEL FIBER LAXATIVE) packet Take by mouth 2 (two) times daily.       . multivitamin (THERAGRAN) per tablet Take 1 tablet by mouth daily.        . Potassium 99 MG TABS Take by mouth 2 (two) times daily.        . ranitidine (ZANTAC) 150 MG tablet TAKE ONE TABLET BY MOUTH TWICE DAILY  180 tablet  3  . DISCONTD: ADVAIR  DISKUS 100-50 MCG/DOSE AEPB USE 1 INHALATION ORALLY    EVERY 12 HOURS  180 each  3  . DISCONTD: ramipril (ALTACE) 10 MG capsule TAKE 1 CAPSULE DAILY  90 capsule  3    BP 140/80  Pulse 76  Temp 98.2 F (36.8 C)  Resp 16  Ht 5' 2.5" (1.588 m)  Wt 198 lb (89.812 kg)  BMI 35.64 kg/m2       Objective:   Physical Exam  Nursing note and vitals reviewed. Constitutional: She is oriented to person, place, and time. She appears well-developed and well-nourished. No distress.  HENT:  Head: Normocephalic and atraumatic.  Right Ear: External ear normal.  Left Ear: External ear normal.  Nose: Nose normal.  Mouth/Throat: Oropharynx is clear and moist.  Eyes: Conjunctivae and EOM are normal. Pupils are equal, round, and reactive to light.  Neck: Normal range of motion. Neck supple. No JVD present. No tracheal deviation present. No thyromegaly present.  Cardiovascular: Normal rate, regular rhythm, normal heart sounds and intact distal pulses.   No murmur heard. Pulmonary/Chest: Effort normal and breath sounds normal. She has no wheezes. She exhibits no tenderness.  Abdominal: Soft. Bowel sounds are normal.  Musculoskeletal: Normal range of motion. She exhibits no edema and no tenderness.  Lymphadenopathy:    She has no cervical adenopathy.  Neurological: She is alert and oriented to person, place, and time. She has normal reflexes. No cranial nerve deficit.  Skin: Skin is warm and dry. She is not diaphoretic.  Psychiatric: She has a normal mood and affect. Her behavior is normal.          Assessment & Plan:  Her overall symptomology appears to be consistent with a viral type illness she admitted upon further questioning that after the episodes of nausea she did have copious loose stools that evening.  I think she had a viral gastroenteritis and her blood sugars have been elevated for the past week so we will defer her A1c until her next visit in 2 months time.  She is tolerating the  gym to do at this time without any problems her blood pressure stable on her ACE inhibitor her asthma has been stable and her lipid panel we will monitor prior to the next visit

## 2011-04-23 NOTE — Patient Instructions (Addendum)
The patient is instructed to continue all medications as prescribed. Schedule followup with check out clerk upon leaving the clinic May give husband a visit in July

## 2011-04-23 NOTE — Telephone Encounter (Signed)
Opened in error

## 2011-05-13 ENCOUNTER — Other Ambulatory Visit: Payer: Self-pay | Admitting: Internal Medicine

## 2011-05-14 ENCOUNTER — Other Ambulatory Visit: Payer: Self-pay | Admitting: *Deleted

## 2011-05-14 MED ORDER — RANITIDINE HCL 150 MG PO TABS: 150.0000 mg | ORAL_TABLET | Freq: Two times a day (BID) | ORAL | Status: DC

## 2011-06-03 ENCOUNTER — Telehealth: Payer: Self-pay | Admitting: Internal Medicine

## 2011-06-03 DIAGNOSIS — E1169 Type 2 diabetes mellitus with other specified complication: Secondary | ICD-10-CM

## 2011-06-03 NOTE — Telephone Encounter (Signed)
Denise [name] is not working at all pt stated that her sugar levels were very high (200+)  when she checked today. Pt stated she has never had levels this high. Pt requesting to be contacted

## 2011-06-03 NOTE — Telephone Encounter (Signed)
Per dr Lovell Sheehan- switch back to janumet 50-1000 bid.pt informed and has 25 days at home --will use and call when needs refilll

## 2011-06-11 ENCOUNTER — Telehealth: Payer: Self-pay | Admitting: Internal Medicine

## 2011-06-11 NOTE — Telephone Encounter (Signed)
Pt came by office and said that she needs to speak with [facility] about medications for her and her spouse. Her spouse is suppose to be becoming a pts of Dr Lovell Sheehan in July. Pt req samples of Ramapril.

## 2011-06-11 NOTE — Telephone Encounter (Signed)
Spoke with Dr Lovell Sheehan re: pt req. Dr Lovell Sheehan said that there were not any samples avail for Ramipril and that Dr Lovell Sheehan will have triage nurse call in a refill for pts spouse. Pt has been informed and note was given to triage nurse. Pt uses [facility] on [address] and is req a 90 day supply.

## 2011-06-29 ENCOUNTER — Telehealth: Payer: Self-pay | Admitting: Internal Medicine

## 2011-06-29 ENCOUNTER — Other Ambulatory Visit: Payer: Self-pay | Admitting: *Deleted

## 2011-06-29 MED ORDER — ALBUTEROL SULFATE HFA 108 (90 BASE) MCG/ACT IN AERS: 2.0000 | INHALATION_SPRAY | Freq: Four times a day (QID) | RESPIRATORY_TRACT | Status: DC | PRN

## 2011-06-29 NOTE — Telephone Encounter (Signed)
Sent in and talked with pt and told her it would be albuterol inhaler

## 2011-06-29 NOTE — Telephone Encounter (Signed)
Pt completely out of ProAir. Pls call in to [facility]. Pt says that she usually ger 4 of them.

## 2011-07-07 ENCOUNTER — Other Ambulatory Visit: Payer: Self-pay | Admitting: *Deleted

## 2011-07-07 DIAGNOSIS — I10 Essential (primary) hypertension: Secondary | ICD-10-CM

## 2011-07-07 MED ORDER — RAMIPRIL 10 MG PO CAPS: ORAL_CAPSULE | ORAL | Status: DC

## 2011-07-20 ENCOUNTER — Other Ambulatory Visit (INDEPENDENT_AMBULATORY_CARE_PROVIDER_SITE_OTHER): Payer: Medicare Other

## 2011-07-20 DIAGNOSIS — E1165 Type 2 diabetes mellitus with hyperglycemia: Secondary | ICD-10-CM

## 2011-07-20 DIAGNOSIS — E785 Hyperlipidemia, unspecified: Secondary | ICD-10-CM

## 2011-07-20 LAB — HEPATIC FUNCTION PANEL
Albumin: 3.9 g/dL (ref 3.5–5.2)
Alkaline Phosphatase: 55 U/L (ref 39–117)
Total Protein: 7.3 g/dL (ref 6.0–8.3)

## 2011-07-20 LAB — LIPID PANEL
Cholesterol: 144 mg/dL (ref 0–200)
HDL: 57 mg/dL (ref >39.00)

## 2011-07-20 LAB — HEMOGLOBIN A1C: Hgb A1c MFr Bld: 10.3 % — ABNORMAL HIGH (ref 4.6–6.5)

## 2011-07-26 ENCOUNTER — Encounter: Payer: Self-pay | Admitting: Internal Medicine

## 2011-07-26 ENCOUNTER — Ambulatory Visit (INDEPENDENT_AMBULATORY_CARE_PROVIDER_SITE_OTHER): Payer: Medicare Other | Admitting: Internal Medicine

## 2011-07-26 ENCOUNTER — Other Ambulatory Visit: Payer: Self-pay | Admitting: *Deleted

## 2011-07-26 VITALS — BP 146/72 | HR 72 | Temp 98.2°F | Resp 16 | Ht 63.5 in | Wt 194.0 lb

## 2011-07-26 DIAGNOSIS — E1165 Type 2 diabetes mellitus with hyperglycemia: Secondary | ICD-10-CM

## 2011-07-26 DIAGNOSIS — E1169 Type 2 diabetes mellitus with other specified complication: Secondary | ICD-10-CM

## 2011-07-26 MED ORDER — METFORMIN HCL 500 MG PO TABS: 500.0000 mg | ORAL_TABLET | Freq: Two times a day (BID) | ORAL | Status: DC

## 2011-07-26 MED ORDER — GLYBURIDE 5 MG PO TABS: 5.0000 mg | ORAL_TABLET | Freq: Two times a day (BID) | ORAL | Status: DC

## 2011-07-26 NOTE — Progress Notes (Signed)
Subjective:    Patient ID: Denise [name], female    DOB: 05-04-1940, 71 y.o.   MRN: [id number]  HPI Patient was changed from her metformin to a new oral medication and she did not tolerate it as well she noticed elevation of her blood glucoses this was confirmed by her hemoglobin A1c increasing to points to 10.  The patient resumed her metformin and feels that the blood sugars are returning more towards her pre-change levels.  Otherwise her liver functions are completely normal her cholesterol is excellent with a total cholesterol 144 HDL of 57 and LDL 67.      Review of Systems  Constitutional: Negative for activity change, appetite change and fatigue.  HENT: Negative for ear pain, congestion, neck pain, postnasal drip and sinus pressure.   Eyes: Negative for redness and visual disturbance.  Respiratory: Negative for cough, shortness of breath and wheezing.   Gastrointestinal: Negative for abdominal pain and abdominal distention.  Genitourinary: Negative for dysuria, frequency and menstrual problem.  Musculoskeletal: Negative for myalgias, joint swelling and arthralgias.  Skin: Negative for rash and wound.  Neurological: Negative for dizziness, weakness and headaches.  Hematological: Negative for adenopathy. Does not bruise/bleed easily.  Psychiatric/Behavioral: Negative for disturbed wake/sleep cycle and decreased concentration.   Past Medical History  Diagnosis Date  . Asthma   . COPD (chronic obstructive pulmonary disease)   . GERD (gastroesophageal reflux disease)   . Arthritis   . Diabetes mellitus   . Hyperlipidemia   . Hypertension   . Hx of atrial fibrillation, no current medication     History   Social History  . Marital Status: Married    Spouse Name: N/A    Number of Children: N/A  . Years of Education: N/A   Occupational History  . Not on file.   Social History Main Topics  . Smoking status: Never Smoker   . Smokeless tobacco: Not on file  .  Alcohol Use: No  . Drug Use: No  . Sexually Active: Yes   Other Topics Concern  . Not on file   Social History Narrative  . No narrative on file    Past Surgical History  Procedure Date  . Colopnoscopy 05/17/2001  . Tonsillectomy   . Oophorectomy cyst   . Cardiac ablation in 2001 for a fib     Family History  Problem Relation Age of Onset  . Diabetes    . Heart disease Mother   . Heart disease Father     Allergies  Allergen Reactions  . Codeine   . Sulfonamide Derivatives     Current Outpatient Prescriptions on File Prior to Visit  Medication Sig Dispense Refill  . albuterol (PROVENTIL HFA;VENTOLIN HFA) 108 (90 BASE) MCG/ACT inhaler Inhale 2 puffs into the lungs every 6 (six) hours as needed.  4 Inhaler  3  . aspirin 325 MG tablet Take 325 mg by mouth daily.        Marland Kitchen atorvastatin (LIPITOR) 20 MG tablet TAKE 1 TABLET ONCE DAILY  90 tablet  0  . Calcium Carbonate-Vitamin D (CALTRATE 600+D PO) Take by mouth 2 (two) times daily.        . ergocalciferol (VITAMIN D2) 50000 UNITS capsule Take 50,000 Units by mouth. Every other week      . fish oil-omega-3 fatty acids 1000 MG capsule Take 2 g by mouth daily.        . Fluticasone-Salmeterol (ADVAIR DISKUS) 100-50 MCG/DOSE AEPB Inhale 1 puff into the lungs 2 (  two) times daily.  180 each  3  . glyBURIDE (DIABETA) 5 MG tablet Take 1 tablet (5 mg total) by mouth 2 (two) times daily.  60 tablet  3  . metFORMIN (GLUCOPHAGE) 500 MG tablet Take 1 tablet (500 mg total) by mouth 2 (two) times daily with a meal.  180 tablet  3  . methylcellulose (CITRUCEL FIBER LAXATIVE) packet Take by mouth 2 (two) times daily.       . multivitamin (THERAGRAN) per tablet Take 1 tablet by mouth daily.        . Potassium 99 MG TABS Take by mouth 2 (two) times daily.        . ramipril (ALTACE) 10 MG capsule Pt lost several pills- this is an early refill!!please fill  30 capsule  1  . ranitidine (ZANTAC) 150 MG tablet Take 1 tablet (150 mg total) by mouth 2  (two) times daily.  180 tablet  3    BP 146/72  Pulse 72  Temp 98.2 F (36.8 C)  Resp 16  Ht 5' 3.5" (1.613 m)  Wt 194 lb (87.998 kg)  BMI 33.83 kg/m2       Objective:   Physical Exam  Vitals reviewed. Constitutional: She is oriented to person, place, and time. She appears well-developed and well-nourished. No distress.  HENT:  Head: Normocephalic and atraumatic.  Right Ear: External ear normal.  Left Ear: External ear normal.  Nose: Nose normal.  Mouth/Throat: Oropharynx is clear and moist.  Eyes: Conjunctivae and EOM are normal. Pupils are equal, round, and reactive to light.  Neck: Normal range of motion. Neck supple. No JVD present. No tracheal deviation present. No thyromegaly present.  Cardiovascular: Normal rate and regular rhythm.   Murmur heard. Pulmonary/Chest: Effort normal and breath sounds normal. She has no wheezes. She exhibits no tenderness.  Abdominal: Soft. Bowel sounds are normal.  Musculoskeletal: Normal range of motion. She exhibits no edema and no tenderness.  Lymphadenopathy:    She has no cervical adenopathy.  Neurological: She is alert and oriented to person, place, and time. She has normal reflexes. No cranial nerve deficit.  Skin: Skin is warm and dry. She is not diaphoretic.  Psychiatric: She has a normal mood and affect. Her behavior is normal.          Assessment & Plan:  Patient failed an alternative to metformin therefore we will resume metformin for control of her diabetes she is tolerating the medication well has excellent liver function and renal function as well as excellent cholesterol.  The A1c did increase to 10 on the alternative medication

## 2011-07-26 NOTE — Patient Instructions (Addendum)
The patient is instructed to continue all medications as prescribed. Schedule followup with check out clerk upon leaving the clinic  

## 2011-07-26 NOTE — Addendum Note (Signed)
Addended by: Stacie Glaze on: 07/26/2011 03:19 PM   Modules accepted: Orders

## 2011-07-27 ENCOUNTER — Ambulatory Visit: Payer: Medicare Other | Admitting: Internal Medicine

## 2011-10-04 ENCOUNTER — Telehealth: Payer: Self-pay | Admitting: Internal Medicine

## 2011-10-04 DIAGNOSIS — E1165 Type 2 diabetes mellitus with hyperglycemia: Secondary | ICD-10-CM

## 2011-10-04 MED ORDER — METFORMIN HCL 500 MG PO TABS: 500.0000 mg | ORAL_TABLET | Freq: Two times a day (BID) | ORAL | Status: DC

## 2011-10-04 NOTE — Telephone Encounter (Signed)
None available=pt informed can get from [facility] free- will call to [facility]

## 2011-10-04 NOTE — Telephone Encounter (Signed)
Patient called stating that she would like samples of glumetza. Please assist. Per patient if she is unavailable leave her a message.

## 2011-10-21 ENCOUNTER — Other Ambulatory Visit: Payer: Medicare Other

## 2011-10-22 ENCOUNTER — Other Ambulatory Visit: Payer: Medicare Other

## 2011-10-28 ENCOUNTER — Ambulatory Visit: Payer: Medicare Other | Admitting: Internal Medicine

## 2011-11-02 ENCOUNTER — Telehealth: Payer: Self-pay | Admitting: Internal Medicine

## 2011-11-02 MED ORDER — ATORVASTATIN CALCIUM 20 MG PO TABS: 20.0000 mg | ORAL_TABLET | Freq: Every day | ORAL | Status: DC

## 2011-11-02 NOTE — Telephone Encounter (Signed)
done

## 2011-11-02 NOTE — Telephone Encounter (Signed)
Patient called stating that she need a refill of her atrovastatin [facility] this will be the first time that she is using them. Please assist.

## 2011-12-13 ENCOUNTER — Other Ambulatory Visit (INDEPENDENT_AMBULATORY_CARE_PROVIDER_SITE_OTHER): Payer: Medicare Other

## 2011-12-13 DIAGNOSIS — T887XXA Unspecified adverse effect of drug or medicament, initial encounter: Secondary | ICD-10-CM

## 2011-12-13 DIAGNOSIS — E785 Hyperlipidemia, unspecified: Secondary | ICD-10-CM

## 2011-12-13 LAB — HEPATIC FUNCTION PANEL
ALT: 24 U/L (ref 0–35)
Total Bilirubin: 0.6 mg/dL (ref 0.3–1.2)
Total Protein: 7.1 g/dL (ref 6.0–8.3)

## 2011-12-13 LAB — LIPID PANEL
HDL: 56.1 mg/dL (ref >39.00)
VLDL: 18.4 mg/dL (ref 0.0–40.0)

## 2011-12-13 LAB — HEMOGLOBIN A1C: Hgb A1c MFr Bld: 10.3 % — ABNORMAL HIGH (ref 4.6–6.5)

## 2011-12-22 ENCOUNTER — Encounter: Payer: Self-pay | Admitting: Internal Medicine

## 2011-12-22 ENCOUNTER — Ambulatory Visit (INDEPENDENT_AMBULATORY_CARE_PROVIDER_SITE_OTHER): Payer: Medicare Other | Admitting: Internal Medicine

## 2011-12-22 VITALS — BP 140/80 | HR 76 | Temp 98.0°F | Resp 16 | Ht 63.5 in | Wt 196.0 lb

## 2011-12-22 DIAGNOSIS — R413 Other amnesia: Secondary | ICD-10-CM

## 2011-12-22 DIAGNOSIS — Z1331 Encounter for screening for depression: Secondary | ICD-10-CM

## 2011-12-22 DIAGNOSIS — E785 Hyperlipidemia, unspecified: Secondary | ICD-10-CM

## 2011-12-22 DIAGNOSIS — E1165 Type 2 diabetes mellitus with hyperglycemia: Secondary | ICD-10-CM

## 2011-12-22 DIAGNOSIS — E1169 Type 2 diabetes mellitus with other specified complication: Secondary | ICD-10-CM

## 2011-12-22 MED ORDER — LINAGLIPTIN 5 MG PO TABS: 5.0000 mg | ORAL_TABLET | Freq: Every day | ORAL | Status: DC

## 2011-12-22 NOTE — Progress Notes (Signed)
Subjective:    Patient ID: Denise [name], female    DOB: 1940/09/18, 71 y.o.   MRN: [id number]  HPI  The patient has noted significant change in memory functioning.  Her husband states that although she can eventually come to the correct answer that she has some delay in processing that appears to be short-term memory issue. He is concerned about Alzheimer's she states that she has some mild to moderate mood issues as well as a concern about Alzheimer's.  There is a family history of memory disorder Asthma stable Blood pressure up due to holding medications Increased and diabetes are not as well controlled as previously.     Review of Systems  Constitutional: Negative for activity change, appetite change and fatigue.  HENT: Negative for ear pain, congestion, neck pain, postnasal drip and sinus pressure.   Eyes: Negative for redness and visual disturbance.  Respiratory: Negative for cough, shortness of breath and wheezing.   Gastrointestinal: Negative for abdominal pain and abdominal distention.  Genitourinary: Negative for dysuria, frequency and menstrual problem.  Musculoskeletal: Negative for myalgias, joint swelling and arthralgias.  Skin: Negative for rash and wound.  Neurological: Negative for dizziness, weakness and headaches.  Hematological: Negative for adenopathy. Does not bruise/bleed easily.  Psychiatric/Behavioral: Positive for dysphoric mood and decreased concentration. Negative for sleep disturbance.   Past Medical History  Diagnosis Date  . Asthma   . COPD (chronic obstructive pulmonary disease)   . GERD (gastroesophageal reflux disease)   . Arthritis   . Diabetes mellitus   . Hyperlipidemia   . Hypertension   . Hx of atrial fibrillation, no current medication     History   Social History  . Marital Status: Married    Spouse Name: N/A    Number of Children: N/A  . Years of Education: N/A   Occupational History  . Not on file.   Social History  Main Topics  . Smoking status: Never Smoker   . Smokeless tobacco: Not on file  . Alcohol Use: No  . Drug Use: No  . Sexually Active: Yes   Other Topics Concern  . Not on file   Social History Narrative  . No narrative on file    Past Surgical History  Procedure Date  . Colopnoscopy 05/17/2001  . Tonsillectomy   . Oophorectomy cyst   . Cardiac ablation in 2001 for a fib     Family History  Problem Relation Age of Onset  . Diabetes    . Heart disease Mother   . Heart disease Father     Allergies  Allergen Reactions  . Codeine   . Sulfonamide Derivatives     Current Outpatient Prescriptions on File Prior to Visit  Medication Sig Dispense Refill  . albuterol (PROVENTIL HFA;VENTOLIN HFA) 108 (90 BASE) MCG/ACT inhaler Inhale 2 puffs into the lungs every 6 (six) hours as needed.  4 Inhaler  3  . aspirin 325 MG tablet Take 325 mg by mouth daily.        Marland Kitchen atorvastatin (LIPITOR) 20 MG tablet Take 1 tablet (20 mg total) by mouth daily.  90 tablet  3  . Calcium Carbonate-Vitamin D (CALTRATE 600+D PO) Take by mouth 2 (two) times daily.        . ergocalciferol (VITAMIN D2) 50000 UNITS capsule Take 50,000 Units by mouth. Every other week      . fish oil-omega-3 fatty acids 1000 MG capsule Take 2 g by mouth daily.        Marland Kitchen  Fluticasone-Salmeterol (ADVAIR DISKUS) 100-50 MCG/DOSE AEPB Inhale 1 puff into the lungs 2 (two) times daily.  180 each  3  . glyBURIDE (DIABETA) 5 MG tablet Take 1 tablet (5 mg total) by mouth 2 (two) times daily.  180 tablet  3  . metFORMIN (GLUCOPHAGE) 500 MG tablet Take 1 tablet (500 mg total) by mouth 2 (two) times daily with a meal.  60 tablet  11  . multivitamin (THERAGRAN) per tablet Take 1 tablet by mouth daily.        . Potassium 99 MG TABS Take by mouth 2 (two) times daily.        . ramipril (ALTACE) 10 MG capsule Pt lost several pills- this is an early refill!!please fill  30 capsule  1  . ranitidine (ZANTAC) 150 MG tablet Take 1 tablet (150 mg total)  by mouth 2 (two) times daily.  180 tablet  3  . linagliptin (TRADJENTA) 5 MG TABS tablet Take 1 tablet (5 mg total) by mouth daily.  30 tablet    . methylcellulose (CITRUCEL FIBER LAXATIVE) packet Take by mouth 2 (two) times daily.         BP 140/80  Pulse 76  Temp 98 F (36.7 C)  Resp 16  Ht 5' 3.5" (1.613 m)  Wt 196 lb (88.905 kg)  BMI 34.18 kg/m2       Objective:   Physical Exam  Constitutional: She is oriented to person, place, and time. She appears well-developed and well-nourished. No distress.  HENT:  Head: Normocephalic and atraumatic.  Eyes: Conjunctivae normal and EOM are normal. Pupils are equal, round, and reactive to light.  Neck: Normal range of motion. Neck supple. No JVD present. No tracheal deviation present. No thyromegaly present.  Cardiovascular: Normal rate and regular rhythm.   Murmur heard. Pulmonary/Chest: Effort normal and breath sounds normal. She has no wheezes. She exhibits no tenderness.  Abdominal: Soft. Bowel sounds are normal.  Musculoskeletal: Normal range of motion. She exhibits no edema and no tenderness.  Lymphadenopathy:    She has no cervical adenopathy.  Neurological: She is alert and oriented to person, place, and time. She has normal reflexes. No cranial nerve deficit.  Skin: Skin is warm and dry. She is not diaphoretic.          Assessment & Plan:  Hemoglobin A1c should be measured for diabetic control.  A basic metabolic panel should be measured to look at potassium and creatinine in the light of hypertension diabetes.  Asthma appears to be stable on Advair  Patient has a memory disorder which is difficult to ascertain if this is depression versus a true organic brain syndrome or Alzheimer's she will be referred for neuro psych testing and a neurology referral to help [place] to differentiate.

## 2012-02-22 DIAGNOSIS — R413 Other amnesia: Secondary | ICD-10-CM

## 2012-02-23 ENCOUNTER — Telehealth: Payer: Self-pay | Admitting: Internal Medicine

## 2012-02-23 DIAGNOSIS — I10 Essential (primary) hypertension: Secondary | ICD-10-CM

## 2012-02-23 MED ORDER — RAMIPRIL 10 MG PO CAPS: ORAL_CAPSULE | ORAL | Status: DC

## 2012-02-23 MED ORDER — FLUTICASONE-SALMETEROL 100-50 MCG/DOSE IN AEPB: 1.0000 | INHALATION_SPRAY | Freq: Two times a day (BID) | RESPIRATORY_TRACT | Status: DC

## 2012-02-23 NOTE — Telephone Encounter (Signed)
Pt needs refill of: ramipril (ALTACE) 10 MG capsule Fluticasone-Salmeterol (ADVAIR DISKUS) 100-50 MCG/DOSE  New pharm as of Jan 19, 2012:[facility]                     [facility]

## 2012-02-23 NOTE — Telephone Encounter (Signed)
done

## 2012-03-17 ENCOUNTER — Other Ambulatory Visit (INDEPENDENT_AMBULATORY_CARE_PROVIDER_SITE_OTHER): Payer: Medicare Other

## 2012-03-21 ENCOUNTER — Telehealth: Payer: Self-pay | Admitting: Internal Medicine

## 2012-03-21 NOTE — Telephone Encounter (Signed)
Please tell pt she will have to start lantus insulin20 units at bedtime,  She can come Thursday afternoon around2:30 and I will instruct how to give

## 2012-03-21 NOTE — Telephone Encounter (Signed)
Nurse from the pt's insurance , [facility], is out at [employer] doing their once a year house call. States she did a urine on the pt and the patient has 3 - 4+ glucose urea. Glucose is 275 (at 2:30pm 1 hr after eating).  Guidelines states the nurse has to notify PCP. Pt has appt this Friday with Dr. Lovell Sheehan. Please advise.

## 2012-03-21 NOTE — Telephone Encounter (Signed)
Spoke with patient and she will be here Thursday at 2:30 pm

## 2012-03-24 ENCOUNTER — Ambulatory Visit: Payer: Medicare Other | Admitting: Internal Medicine

## 2012-03-27 ENCOUNTER — Telehealth: Payer: Self-pay | Admitting: Internal Medicine

## 2012-03-27 NOTE — Telephone Encounter (Signed)
Pt had to reschedule appt on Fri due to snow. First available is June 11. Pt states she would like me to check w/ MD to see if she should have an earlier appt  Pls advise

## 2012-03-28 NOTE — Telephone Encounter (Signed)
Appointment made for 05-12-2012

## 2012-03-28 NOTE — Telephone Encounter (Signed)
This is to correct last entry. Appointment made with Adline Mango for tomorrow 3/12,pt aware

## 2012-03-28 NOTE — Telephone Encounter (Signed)
Appointment with padonda to [facility]

## 2012-03-29 ENCOUNTER — Encounter: Payer: Self-pay | Admitting: Family

## 2012-03-29 ENCOUNTER — Ambulatory Visit (INDEPENDENT_AMBULATORY_CARE_PROVIDER_SITE_OTHER): Payer: Medicare Other | Admitting: Family

## 2012-03-29 VITALS — HR 84 | Wt 205.0 lb

## 2012-03-29 DIAGNOSIS — E1165 Type 2 diabetes mellitus with hyperglycemia: Secondary | ICD-10-CM

## 2012-03-29 DIAGNOSIS — E118 Type 2 diabetes mellitus with unspecified complications: Secondary | ICD-10-CM

## 2012-03-29 MED ORDER — FREESTYLE LANCETS MISC: Status: DC

## 2012-03-29 MED ORDER — GLUCOSE BLOOD VI STRP: ORAL_STRIP | Status: DC

## 2012-03-29 NOTE — Progress Notes (Signed)
Subjective:    Patient ID: Denise [name], female    DOB: [birth year], 72 y.o.   MRN: [id number]  HPI 72 year old WF, nonsmoker, patient of Dr. Lovell Sheehan is in today to request to change her Lantus solostar to Lantus vial. He husband is not present but she says he likes the vial better. She has not taken insulin in about 1 week. She has not been checking her blood glucose routinely. However, remembers having a reading around 280 several weeks ago. She is also requesting a new glucometer with test strips and lancets.  She also takes Trajenta almost everyday.    Review of Systems  Constitutional: Negative.   HENT: Negative.   Respiratory: Negative.   Cardiovascular: Negative.   Gastrointestinal: Negative.   Endocrine: Negative.  Negative for polydipsia, polyphagia and polyuria.  Genitourinary: Negative.   Musculoskeletal: Negative.   Neurological: Negative.   Psychiatric/Behavioral: Negative.    Past Medical History  Diagnosis Date  . Asthma   . COPD (chronic obstructive pulmonary disease)   . GERD (gastroesophageal reflux disease)   . Arthritis   . Diabetes mellitus   . Hyperlipidemia   . Hypertension   . Hx of atrial fibrillation, no current medication     History   Social History  . Marital Status: Married    Spouse Name: N/A    Number of Children: N/A  . Years of Education: N/A   Occupational History  . Not on file.   Social History Main Topics  . Smoking status: Never Smoker   . Smokeless tobacco: Not on file  . Alcohol Use: No  . Drug Use: No  . Sexually Active: Yes   Other Topics Concern  . Not on file   Social History Narrative  . No narrative on file    Past Surgical History  Procedure Laterality Date  . Colopnoscopy  05/17/2001  . Tonsillectomy    . Oophorectomy cyst    . Cardiac ablation in 2001 for a fib      Family History  Problem Relation Age of Onset  . Diabetes    . Heart disease Mother   . Heart disease Father     Allergies   Allergen Reactions  . Codeine   . Sulfonamide Derivatives     Current Outpatient Prescriptions on File Prior to Visit  Medication Sig Dispense Refill  . albuterol (PROVENTIL HFA;VENTOLIN HFA) 108 (90 BASE) MCG/ACT inhaler Inhale 2 puffs into the lungs every 6 (six) hours as needed.  4 Inhaler  3  . aspirin 325 MG tablet Take 325 mg by mouth daily.        Marland Kitchen atorvastatin (LIPITOR) 20 MG tablet Take 1 tablet (20 mg total) by mouth daily.  90 tablet  3  . Calcium Carbonate-Vitamin D (CALTRATE 600+D PO) Take by mouth 2 (two) times daily.        . ergocalciferol (VITAMIN D2) 50000 UNITS capsule Take 50,000 Units by mouth. Every other week      . fish oil-omega-3 fatty acids 1000 MG capsule Take 2 g by mouth daily.        . Fluticasone-Salmeterol (ADVAIR DISKUS) 100-50 MCG/DOSE AEPB Inhale 1 puff into the lungs 2 (two) times daily.  180 each  3  . glyBURIDE (DIABETA) 5 MG tablet Take 1 tablet (5 mg total) by mouth 2 (two) times daily.  180 tablet  3  . linagliptin (TRADJENTA) 5 MG TABS tablet Take 1 tablet (5 mg total) by mouth daily.  30 tablet    . metFORMIN (GLUCOPHAGE) 500 MG tablet Take 1 tablet (500 mg total) by mouth 2 (two) times daily with a meal.  60 tablet  11  . methylcellulose (CITRUCEL FIBER LAXATIVE) packet Take by mouth 2 (two) times daily.       . multivitamin (THERAGRAN) per tablet Take 1 tablet by mouth daily.        . Potassium 99 MG TABS Take by mouth 2 (two) times daily.        . ramipril (ALTACE) 10 MG capsule Pt lost several pills- this is an early refill!!please fill  90 capsule  3  . ranitidine (ZANTAC) 150 MG tablet Take 1 tablet (150 mg total) by mouth 2 (two) times daily.  180 tablet  3   No current facility-administered medications on file prior to visit.    Pulse 84  Wt 205 lb (92.987 kg)  BMI 35.74 kg/m2  SpO2 97%chart    Objective:   Physical Exam  Constitutional: She is oriented to person, place, and time. She appears well-developed and well-nourished.   HENT:  Right Ear: External ear normal.  Left Ear: External ear normal.  Nose: Nose normal.  Mouth/Throat: Oropharynx is clear and moist.  Neck: Normal range of motion. Neck supple.  Cardiovascular: Normal rate, regular rhythm and normal heart sounds.   Pulmonary/Chest: Effort normal and breath sounds normal.  Neurological: She is alert and oriented to person, place, and time.  Skin: Skin is warm and dry.  Psychiatric: She has a normal mood and affect.          Assessment & Plan:  Assessment:  1. Type 2 Diabetes-uncontrolled.  2. Medical Noncompliance.  3. Hypertension 4. Hyperlipidemia  Plan: Explained to patient that she will get 50% less insulin from the vial than the pen. She has elected to stay on the pen. Encouraged compliance. Samples of Trajenta given today. Follow up as scheduled and as needed.

## 2012-03-29 NOTE — Patient Instructions (Signed)

## 2012-03-31 ENCOUNTER — Other Ambulatory Visit: Payer: Self-pay

## 2012-03-31 MED ORDER — INSULIN GLARGINE 100 UNIT/ML ~~LOC~~ SOLN: 15.0000 [IU] | Freq: Every day | SUBCUTANEOUS | Status: DC

## 2012-03-31 MED ORDER — "INSULIN SYRINGE 30G X 5/16"" 0.5 ML MISC": 15.0000 [IU] | Freq: Every day | Status: DC

## 2012-04-03 ENCOUNTER — Other Ambulatory Visit: Payer: Self-pay | Admitting: *Deleted

## 2012-04-03 ENCOUNTER — Ambulatory Visit: Payer: Medicare Other | Admitting: Family

## 2012-04-03 MED ORDER — ACCU-CHEK FASTCLIX LANCETS MISC: 1.0000 | Freq: Every day | Status: DC

## 2012-04-03 MED ORDER — GLUCOSE BLOOD VI STRP: 1.0000 | ORAL_STRIP | Freq: Every day | Status: DC

## 2012-04-17 ENCOUNTER — Telehealth: Payer: Self-pay | Admitting: Internal Medicine

## 2012-04-17 MED ORDER — INSULIN PEN NEEDLE 32G X 4 MM MISC: Status: DC

## 2012-04-17 NOTE — Telephone Encounter (Signed)
[facility] called and stated that pt needs a new RX for pen needles. Size nano 32g 54xmm. Please assist. Reference number- [id number]

## 2012-04-17 NOTE — Telephone Encounter (Signed)
error 

## 2012-04-17 NOTE — Telephone Encounter (Signed)
Sent as requested.

## 2012-04-19 ENCOUNTER — Other Ambulatory Visit: Payer: Self-pay

## 2012-05-01 ENCOUNTER — Encounter: Payer: Self-pay | Admitting: *Deleted

## 2012-05-02 ENCOUNTER — Encounter: Payer: Self-pay | Admitting: Obstetrics and Gynecology

## 2012-05-02 ENCOUNTER — Ambulatory Visit (INDEPENDENT_AMBULATORY_CARE_PROVIDER_SITE_OTHER): Payer: Medicare Other | Admitting: Obstetrics and Gynecology

## 2012-05-02 ENCOUNTER — Ambulatory Visit: Payer: Self-pay | Admitting: Obstetrics and Gynecology

## 2012-05-02 VITALS — BP 124/62 | Ht 63.0 in | Wt 203.0 lb

## 2012-05-02 DIAGNOSIS — Z01419 Encounter for gynecological examination (general) (routine) without abnormal findings: Secondary | ICD-10-CM

## 2012-05-02 NOTE — Progress Notes (Signed)
72 y.o.  Married  Caucasian female   G1P0010 here for annual exam.  Good year in health.    Still taking fosamax.  Started on insulin three weeks ago and tolerating fine.    Patient's last menstrual period was 01/19/1996.          Sexually active: yes  The current method of family planning is post menopausal status.    Exercising: walk on treadmill for 30 mins 5-7 days a week Last mammogram:  03/04/11 normal Encouraged to go! Last pap smear: 11/25/07 neg History of abnormal pap: no Smoking: no Alcohol: no Last colonoscopy: 2009 normal every 5-10 years Last Bone Density: 02/16/10 osteoporosis  Last tetanus shot: 2013 Last cholesterol check: not sure  Hgb:   pcp             Urine: pcp    Health Maintenance  Topic Date Due  . Tetanus/tdap  09/06/1958  . Colonoscopy  09/05/1989  . Zostavax  09/06/1999  . Pneumococcal Polysaccharide Vaccine Age 51 And Over  09/05/2004  . Influenza Vaccine  09/18/2012    Family History  Problem Relation Age of Onset  . Heart disease Mother     Patient Active Problem List  Diagnosis  . HYPERLIPIDEMIA  . CARPAL TUNNEL SYNDROME, RIGHT  . HYPERTENSION  . ACUTE BRONCHITIS  . ASTHMA  . COPD  . GERD  . ACTINIC KERATOSIS  . OSTEOARTHRITIS  . PLANTAR FASCIITIS, LEFT  . OSTEOPENIA  . UNS ADVRS EFF UNS RX MEDICINAL&BIOLOGICAL SBSTNC  . DIAB W/OTH MANIFESTS TYPE II/UNS TYPE UNCNTRL    Past Medical History  Diagnosis Date  . Asthma   . COPD (chronic obstructive pulmonary disease)   . GERD (gastroesophageal reflux disease)   . Arthritis   . Diabetes mellitus   . Hyperlipidemia   . Hypertension   . Hx of atrial fibrillation, no current medication   . Osteopenia   . Asthma   . Cataracts, bilateral 12/2010  . Plantar fasciitis 2010  . Carpal tunnel syndrome 09/2006    bilateral     Past Surgical History  Procedure Laterality Date  . Colopnoscopy  05/17/2001  . Tonsillectomy    . Oophorectomy cyst    . Cardiac ablation in 2001 for a fib     . Carpal tunnel release Bilateral 09/2006  . Breast biopsy Left 2001     Allergies: Codeine and Sulfonamide derivatives  Current Outpatient Prescriptions  Medication Sig Dispense Refill  . ACCU-CHEK FASTCLIX LANCETS MISC 1 each by Does not apply route daily.  102 each  3  . albuterol (PROVENTIL HFA;VENTOLIN HFA) 108 (90 BASE) MCG/ACT inhaler Inhale 2 puffs into the lungs every 6 (six) hours as needed.  4 Inhaler  3  . alendronate (FOSAMAX) 70 MG tablet       . aspirin 325 MG tablet Take 325 mg by mouth daily.        Marland Kitchen atorvastatin (LIPITOR) 20 MG tablet Take 1 tablet (20 mg total) by mouth daily.  90 tablet  3  . Calcium Carbonate-Vitamin D (CALTRATE 600+D PO) Take by mouth 2 (two) times daily.        . ergocalciferol (VITAMIN D2) 50000 UNITS capsule Take 50,000 Units by mouth. Every other week      . fish oil-omega-3 fatty acids 1000 MG capsule Take 2 g by mouth daily.        . Fluticasone-Salmeterol (ADVAIR DISKUS) 100-50 MCG/DOSE AEPB Inhale 1 puff into the lungs 2 (two) times daily.  180 each  3  . glucose blood (ACCU-CHEK SMARTVIEW) test strip 1 each by Other route daily. Use as instructed  102 each  3  . glyBURIDE (DIABETA) 5 MG tablet Take 1 tablet (5 mg total) by mouth 2 (two) times daily.  180 tablet  3  . insulin glargine (LANTUS SOLOSTAR) 100 UNIT/ML injection Inject 15 Units into the skin at bedtime.  10 mL  3  . Insulin Pen Needle (BD PEN NEEDLE NANO U/F) 32G X 4 MM MISC 1 qd  100 each  6  . linagliptin (TRADJENTA) 5 MG TABS tablet Take 1 tablet (5 mg total) by mouth daily.  30 tablet    . metFORMIN (GLUCOPHAGE) 500 MG tablet Take 1 tablet (500 mg total) by mouth 2 (two) times daily with a meal.  60 tablet  11  . methylcellulose (CITRUCEL FIBER LAXATIVE) packet Take by mouth 2 (two) times daily.       . multivitamin (THERAGRAN) per tablet Take 1 tablet by mouth daily.        . Potassium 99 MG TABS Take by mouth 2 (two) times daily.        . ramipril (ALTACE) 10 MG capsule Pt  lost several pills- this is an early refill!!please fill  90 capsule  3  . ranitidine (ZANTAC) 150 MG tablet Take 1 tablet (150 mg total) by mouth 2 (two) times daily.  180 tablet  3   No current facility-administered medications for this visit.    ROS: Pertinent items are noted in HPI.  Social Hx:  Married, one son, one granddaughter age 80 - lives near [place].  Married for 50 years!  Exam:    BP 124/62  Ht 5\' 3"  (1.6 m)  Wt 203 lb (92.08 kg)  BMI 35.97 kg/m2  LMP 01/19/1996 down 2 pounds from last year  Wt Readings from Last 3 Encounters:  05/02/12 203 lb (92.08 kg)  03/29/12 205 lb (92.987 kg)  12/22/11 196 lb (88.905 kg)     Ht Readings from Last 3 Encounters:  05/02/12 5\' 3"  (1.6 m)  12/22/11 5' 3.5" (1.613 m)  07/26/11 5' 3.5" (1.613 m)    General appearance: alert, cooperative and appears stated age Head: Normocephalic, without obvious abnormality, atraumatic Neck: no adenopathy, supple, symmetrical, trachea midline and thyroid not enlarged, symmetric, no tenderness/mass/nodules Lungs: clear to auscultation bilaterally Breasts: Inspection negative, No nipple retraction or dimpling, No nipple discharge or bleeding, No axillary or supraclavicular adenopathy, Normal to palpation without dominant masses Heart: regular rate and rhythm Abdomen: soft, non-tender; bowel sounds normal; no masses,  no organomegaly Extremities: extremities normal, atraumatic, no cyanosis or edema Skin: Skin color, texture, turgor normal. No rashes or lesions Lymph nodes: Cervical, supraclavicular, and axillary nodes normal. No abnormal inguinal nodes palpated Neurologic: Grossly normal   Pelvic: External genitalia:  no lesions              Urethra:  normal appearing urethra with no masses, tenderness or lesions              Bartholins and Skenes: normal                 Vagina: normal appearing vagina with normal color and discharge, no lesions, atrophic appearing              Cervix:  normal appearance              Pap taken: no        Bimanual  Exam:  Uterus:  uterus is normal size, shape, consistency and nontender, midposition, mobile                                      Adnexa: normal adnexa in size, nontender and no masses                                      Rectovaginal: Confirms                                      Anus:  normal sphincter tone, no lesions  A: normal menopausal exam, no HRT     IDDM, HTN, asthma     Osteoporosis, on fosamax (had quit in Nov 2010 for a drug holiday, but BMD in Jan 2012 showed signif loss over the prev study, so patient restarted the fosamax in Feb 2010)     P: mammogram counseled on breast self exam, mammography screening, osteoporosis, adequate intake of calcium and vitamin D, diet and exercise return annually or prn     An After Visit Summary was printed and given to the patient.

## 2012-05-02 NOTE — Patient Instructions (Signed)

## 2012-05-03 ENCOUNTER — Other Ambulatory Visit: Payer: Self-pay | Admitting: Obstetrics and Gynecology

## 2012-05-12 ENCOUNTER — Ambulatory Visit (INDEPENDENT_AMBULATORY_CARE_PROVIDER_SITE_OTHER): Payer: 59 | Admitting: Internal Medicine

## 2012-05-12 ENCOUNTER — Encounter: Payer: Self-pay | Admitting: Internal Medicine

## 2012-05-12 VITALS — BP 132/70 | HR 76 | Temp 98.6°F | Resp 16 | Ht 63.0 in | Wt 200.0 lb

## 2012-05-12 DIAGNOSIS — I10 Essential (primary) hypertension: Secondary | ICD-10-CM

## 2012-05-12 DIAGNOSIS — E1165 Type 2 diabetes mellitus with hyperglycemia: Secondary | ICD-10-CM

## 2012-05-12 LAB — COMPREHENSIVE METABOLIC PANEL
Albumin: 4 g/dL (ref 3.5–5.2)
BUN: 16 mg/dL (ref 6–23)
CO2: 27 mEq/L (ref 19–32)
Calcium: 9.9 mg/dL (ref 8.4–10.5)
Chloride: 103 mEq/L (ref 96–112)
Creatinine, Ser: 0.6 mg/dL (ref 0.4–1.2)
GFR: 106.29 mL/min (ref >60.00)
Glucose, Bld: 112 mg/dL — ABNORMAL HIGH (ref 70–99)
Potassium: 3.9 mEq/L (ref 3.5–5.1)

## 2012-05-12 NOTE — Patient Instructions (Addendum)
We will check a hemoglobin A1c today and see if it is lowered to approximately 8. We will increase her Lantus to 18 units. We will continue the other medications as prescribed and return in 2 months time  B12 and vitamin e

## 2012-05-12 NOTE — Progress Notes (Signed)
Subjective:    Patient ID: Denise [name], female    DOB: 09/16/1940, 72 y.o.   MRN: [id number]  HPI Change of DM medications due to elevations of A!C to 10 Correlation of CBG's with diet CBG's will  Vary Goal is under 150    Review of Systems  Constitutional: Negative for activity change, appetite change and fatigue.  HENT: Negative for ear pain, congestion, neck pain, postnasal drip and sinus pressure.   Eyes: Negative for redness and visual disturbance.  Respiratory: Negative for cough, shortness of breath and wheezing.   Gastrointestinal: Negative for abdominal pain and abdominal distention.  Genitourinary: Negative for dysuria, frequency and menstrual problem.  Musculoskeletal: Negative for myalgias, joint swelling and arthralgias.  Skin: Negative for rash and wound.  Neurological: Negative for dizziness, weakness and headaches.  Hematological: Negative for adenopathy. Does not bruise/bleed easily.  Psychiatric/Behavioral: Positive for dysphoric mood and decreased concentration. Negative for sleep disturbance.   Past Medical History  Diagnosis Date  . Asthma   . COPD (chronic obstructive pulmonary disease)   . GERD (gastroesophageal reflux disease)   . Arthritis   . Diabetes mellitus   . Hyperlipidemia   . Hypertension   . Hx of atrial fibrillation, no current medication   . Osteopenia   . Asthma   . Cataracts, bilateral 12/2010  . Plantar fasciitis 2010  . Carpal tunnel syndrome 09/2006    bilateral     History   Social History  . Marital Status: Married    Spouse Name: N/A    Number of Children: N/A  . Years of Education: N/A   Occupational History  . Not on file.   Social History Main Topics  . Smoking status: Never Smoker   . Smokeless tobacco: Not on file  . Alcohol Use: No  . Drug Use: No  . Sexually Active: Yes -- Female partner(s)    Birth Control/ Protection: Post-menopausal   Other Topics Concern  . Not on file   Social History Narrative   . No narrative on file    Past Surgical History  Procedure Laterality Date  . Colopnoscopy  05/17/2001  . Tonsillectomy    . Oophorectomy cyst    . Cardiac ablation in 2001 for a fib    . Carpal tunnel release Bilateral 09/2006  . Breast biopsy Left 2001     Family History  Problem Relation Age of Onset  . Heart disease Mother     Allergies  Allergen Reactions  . Codeine   . Sulfonamide Derivatives     Current Outpatient Prescriptions on File Prior to Visit  Medication Sig Dispense Refill  . ACCU-CHEK FASTCLIX LANCETS MISC 1 each by Does not apply route daily.  102 each  3  . albuterol (PROVENTIL HFA;VENTOLIN HFA) 108 (90 BASE) MCG/ACT inhaler Inhale 2 puffs into the lungs every 6 (six) hours as needed.  4 Inhaler  3  . alendronate (FOSAMAX) 70 MG tablet TAKE ONE TABLET BY MOUTH ONCE A WEEK  12 tablet  3  . aspirin 325 MG tablet Take 325 mg by mouth daily.        Marland Kitchen atorvastatin (LIPITOR) 20 MG tablet Take 1 tablet (20 mg total) by mouth daily.  90 tablet  3  . Calcium Carbonate-Vitamin D (CALTRATE 600+D PO) Take by mouth 2 (two) times daily.        . ergocalciferol (VITAMIN D2) 50000 UNITS capsule Take 50,000 Units by mouth. Every other week      .  fish oil-omega-3 fatty acids 1000 MG capsule Take 2 g by mouth daily.        . Fluticasone-Salmeterol (ADVAIR DISKUS) 100-50 MCG/DOSE AEPB Inhale 1 puff into the lungs 2 (two) times daily.  180 each  3  . glucose blood (ACCU-CHEK SMARTVIEW) test strip 1 each by Other route daily. Use as instructed  102 each  3  . glyBURIDE (DIABETA) 5 MG tablet Take 1 tablet (5 mg total) by mouth 2 (two) times daily.  180 tablet  3  . insulin glargine (LANTUS SOLOSTAR) 100 UNIT/ML injection Inject 15 Units into the skin at bedtime.  10 mL  3  . Insulin Pen Needle (BD PEN NEEDLE NANO U/F) 32G X 4 MM MISC 1 qd  100 each  6  . linagliptin (TRADJENTA) 5 MG TABS tablet Take 1 tablet (5 mg total) by mouth daily.  30 tablet    . metFORMIN (GLUCOPHAGE) 500  MG tablet Take 1 tablet (500 mg total) by mouth 2 (two) times daily with a meal.  60 tablet  11  . multivitamin (THERAGRAN) per tablet Take 1 tablet by mouth daily.        . Potassium 99 MG TABS Take by mouth 2 (two) times daily.        . ramipril (ALTACE) 10 MG capsule Pt lost several pills- this is an early refill!!please fill  90 capsule  3  . ranitidine (ZANTAC) 150 MG tablet Take 1 tablet (150 mg total) by mouth 2 (two) times daily.  180 tablet  3   No current facility-administered medications on file prior to visit.    BP 132/70  Pulse 76  Temp(Src) 98.6 F (37 C)  Resp 16  Ht 5\' 3"  (1.6 m)  Wt 200 lb (90.719 kg)  BMI 35.44 kg/m2  LMP 01/19/1996        Objective:   Physical Exam  Nursing note and vitals reviewed. Constitutional: She is oriented to person, place, and time. She appears well-developed and well-nourished. No distress.  HENT:  Head: Normocephalic and atraumatic.  Right Ear: External ear normal.  Left Ear: External ear normal.  Nose: Nose normal.  Mouth/Throat: Oropharynx is clear and moist.  Eyes: Conjunctivae and EOM are normal. Pupils are equal, round, and reactive to light.  Neck: Normal range of motion. Neck supple. No JVD present. No tracheal deviation present. No thyromegaly present.  Cardiovascular: Normal rate, regular rhythm, normal heart sounds and intact distal pulses.   No murmur heard. Pulmonary/Chest: Breath sounds normal. She has no wheezes. She exhibits no tenderness.  Abdominal: Soft. Bowel sounds are normal.  Musculoskeletal: Normal range of motion. She exhibits no edema and no tenderness.  Lymphadenopathy:    She has no cervical adenopathy.  Neurological: She is alert and oriented to person, place, and time. She has normal reflexes. No cranial nerve deficit.  Skin: Skin is warm and dry. She is not diaphoretic.  Psychiatric: She has a normal mood and affect. Her behavior is normal.          Assessment & Plan:  A!C and lipid today  as well as CMET The CBG's are imporved and the patient has not reported any hypoglycemia lantus at 16 units On tradgenta and the metformin Slight increase in the lantius to 18 Discuss the results of neuropsych testing we recommended that she begin vitamin E and vitamin B12 on a daily basis recommended that word games such as word finding games for mental discipline.  We also agree with  the findings of a repeat test at a six-month to one-year interval to make sure that there is no progressive loss I also concerned about her processing and recommend that she get a hearing test

## 2012-05-23 ENCOUNTER — Other Ambulatory Visit: Payer: Self-pay | Admitting: Internal Medicine

## 2012-05-26 ENCOUNTER — Other Ambulatory Visit: Payer: Self-pay | Admitting: *Deleted

## 2012-05-26 MED ORDER — ATORVASTATIN CALCIUM 20 MG PO TABS: 20.0000 mg | ORAL_TABLET | Freq: Every day | ORAL | Status: DC

## 2012-06-05 ENCOUNTER — Telehealth: Payer: Self-pay | Admitting: Internal Medicine

## 2012-06-05 ENCOUNTER — Other Ambulatory Visit: Payer: Self-pay | Admitting: Family

## 2012-06-05 MED ORDER — INSULIN GLARGINE 100 UNIT/ML ~~LOC~~ SOLN: 15.0000 [IU] | Freq: Every day | SUBCUTANEOUS | Status: DC

## 2012-06-05 MED ORDER — GLUCOSE BLOOD VI STRP: ORAL_STRIP | Status: DC

## 2012-06-05 NOTE — Telephone Encounter (Signed)
Pt needs refill for the glucose blood (ACCU-CHEK SMARTVIEW) test strip.AND insulin glargine (LANTUS SOLOSTAR) 100 UNIT/ML injection  Pharm: [facility]

## 2012-06-05 NOTE — Telephone Encounter (Signed)
done

## 2012-06-06 ENCOUNTER — Other Ambulatory Visit: Payer: Self-pay | Admitting: Family

## 2012-06-06 MED ORDER — INSULIN GLARGINE 100 UNIT/ML SOLOSTAR PEN: 18.0000 [IU] | PEN_INJECTOR | Freq: Every day | SUBCUTANEOUS | Status: DC

## 2012-06-28 ENCOUNTER — Encounter: Payer: Self-pay | Admitting: Internal Medicine

## 2012-06-28 ENCOUNTER — Ambulatory Visit (INDEPENDENT_AMBULATORY_CARE_PROVIDER_SITE_OTHER): Payer: Medicare Other | Admitting: Internal Medicine

## 2012-06-28 VITALS — BP 144/70 | HR 76 | Temp 98.3°F | Resp 16 | Ht 63.0 in | Wt 201.0 lb

## 2012-06-28 DIAGNOSIS — J449 Chronic obstructive pulmonary disease, unspecified: Secondary | ICD-10-CM

## 2012-06-28 DIAGNOSIS — F0391 Unspecified dementia with behavioral disturbance: Secondary | ICD-10-CM

## 2012-06-28 MED ORDER — DONEPEZIL HCL 5 MG PO TABS: 5.0000 mg | ORAL_TABLET | Freq: Every evening | ORAL | Status: DC | PRN

## 2012-06-28 NOTE — Progress Notes (Signed)
  Subjective:    Patient ID: Denise [name], female    DOB: 1940/03/26, 72 y.o.   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  HENT: Negative for ear pain, congestion, neck pain, postnasal drip and sinus pressure.   Eyes: Negative for redness and visual disturbance.  Respiratory: Negative for cough, shortness of breath and wheezing.   Gastrointestinal: Negative for abdominal pain and abdominal distention.  Genitourinary: Negative for dysuria, frequency and menstrual problem.  Musculoskeletal: Negative for myalgias, joint swelling and arthralgias.  Skin: Negative for rash and wound.  Neurological: Negative for dizziness, weakness and headaches.  Hematological: Negative for adenopathy. Does not bruise/bleed easily.  Psychiatric/Behavioral: Negative for sleep disturbance and decreased concentration.       Objective:   Physical Exam  Nursing note and vitals reviewed. Constitutional: She is oriented to person, place, and time. She appears well-developed and well-nourished. No distress.  HENT:  Head: Normocephalic and atraumatic.  Right Ear: External ear normal.  Left Ear: External ear normal.  Nose: Nose normal.  Mouth/Throat: Oropharynx is clear and moist.  Eyes: Conjunctivae and EOM are normal. Pupils are equal, round, and reactive to light.  Neck: Normal range of motion. Neck supple. No JVD present. No tracheal deviation present. No thyromegaly present.  Cardiovascular: Normal rate, regular rhythm, normal heart sounds and intact distal pulses.   No murmur heard. Pulmonary/Chest: Effort normal and breath sounds normal. She has no wheezes. She exhibits no tenderness.  Abdominal: Soft. Bowel sounds are normal.  Musculoskeletal: Normal range of motion. She exhibits no edema and no  tenderness.  Lymphadenopathy:    She has no cervical adenopathy.  Neurological: She is alert and oriented to person, place, and time. She has normal reflexes. No cranial nerve deficit.  Skin: Skin is warm and dry. She is not diaphoretic.  Psychiatric: She has a normal mood and affect. Her behavior is normal.          Assessment & Plan:  Trial of Aricept 5 mg by mouth each bedtime with monitoring of memory Stable blood pressure stable asthma  A1c was 9.0 that was less than 3 months ago we'll monitor hemoglobin A1c at her next visit we have urged her to walk and lose weight.

## 2012-06-30 ENCOUNTER — Telehealth: Payer: Self-pay | Admitting: Internal Medicine

## 2012-06-30 NOTE — Telephone Encounter (Signed)
Pt wants you to know a man will be calling. He is going to start supplying her diabetic supplies: BRIAN MACNAMARA with [employer].

## 2012-07-11 ENCOUNTER — Other Ambulatory Visit: Payer: Self-pay | Admitting: Obstetrics and Gynecology

## 2012-07-11 NOTE — Telephone Encounter (Signed)
OK to RF.  Please call pt and make sure she has checked with Dr. Lovell Sheehan this year.  Has been awhile since Dr. Precious Bard checked it.

## 2012-07-11 NOTE — Telephone Encounter (Signed)
Chart please 

## 2012-07-11 NOTE — Telephone Encounter (Signed)
A fax request was rec'd on 03/28/12 and 3 month supply was given please advise for refill. CR patient

## 2012-07-11 NOTE — Telephone Encounter (Signed)
Patient saw Dr. Lovell Sheehan in June and no Vitamin D level was drawn she will have it checked here at her annual.

## 2012-07-11 NOTE — Telephone Encounter (Signed)
Chart on desk

## 2012-08-11 ENCOUNTER — Telehealth: Payer: Self-pay | Admitting: Internal Medicine

## 2012-08-11 NOTE — Telephone Encounter (Signed)
Med questions

## 2012-08-11 NOTE — Telephone Encounter (Signed)
Pt husband would like bonnye to return his call. Pt has some info. Pt did not elaborate the reason

## 2012-08-14 ENCOUNTER — Other Ambulatory Visit: Payer: Self-pay | Admitting: *Deleted

## 2012-08-14 DIAGNOSIS — J449 Chronic obstructive pulmonary disease, unspecified: Secondary | ICD-10-CM

## 2012-08-14 DIAGNOSIS — F0391 Unspecified dementia with behavioral disturbance: Secondary | ICD-10-CM

## 2012-08-14 MED ORDER — ACCU-CHEK FASTCLIX LANCETS MISC: 1.0000 | Freq: Every day | Status: DC

## 2012-08-14 MED ORDER — DONEPEZIL HCL 5 MG PO TABS: 5.0000 mg | ORAL_TABLET | Freq: Every evening | ORAL | Status: DC | PRN

## 2012-08-14 MED ORDER — INSULIN GLARGINE 100 UNIT/ML ~~LOC~~ SOLN: 25.0000 [IU] | Freq: Every day | SUBCUTANEOUS | Status: DC

## 2012-08-14 MED ORDER — GLUCOSE BLOOD VI STRP: 1.0000 | ORAL_STRIP | Freq: Every day | Status: DC

## 2012-08-14 MED ORDER — INSULIN PEN NEEDLE 32G X 4 MM MISC: Status: DC

## 2012-08-15 ENCOUNTER — Other Ambulatory Visit: Payer: Self-pay | Admitting: *Deleted

## 2012-08-15 DIAGNOSIS — J449 Chronic obstructive pulmonary disease, unspecified: Secondary | ICD-10-CM

## 2012-08-15 DIAGNOSIS — F0391 Unspecified dementia with behavioral disturbance: Secondary | ICD-10-CM

## 2012-08-15 MED ORDER — INSULIN GLARGINE 100 UNIT/ML ~~LOC~~ SOLN: 25.0000 [IU] | Freq: Every day | SUBCUTANEOUS | Status: DC

## 2012-08-15 MED ORDER — DONEPEZIL HCL 5 MG PO TABS: 5.0000 mg | ORAL_TABLET | Freq: Every evening | ORAL | Status: DC | PRN

## 2012-08-15 MED ORDER — ACCU-CHEK FASTCLIX LANCETS MISC: 1.0000 | Freq: Every day | Status: DC

## 2012-08-15 MED ORDER — INSULIN PEN NEEDLE 32G X 4 MM MISC: Status: DC

## 2012-08-15 MED ORDER — GLUCOSE BLOOD VI STRP: 1.0000 | ORAL_STRIP | Freq: Every day | Status: DC

## 2012-08-25 ENCOUNTER — Other Ambulatory Visit: Payer: Self-pay | Admitting: *Deleted

## 2012-08-25 MED ORDER — RANITIDINE HCL 150 MG PO TABS: ORAL_TABLET | ORAL | Status: DC

## 2012-09-01 ENCOUNTER — Other Ambulatory Visit: Payer: Self-pay | Admitting: Obstetrics and Gynecology

## 2012-09-01 ENCOUNTER — Telehealth: Payer: Self-pay | Admitting: Obstetrics and Gynecology

## 2012-09-01 NOTE — Telephone Encounter (Signed)
eScribe request for refill on FOSAMAX Last filled - 04/26/11 X 1 YEAR Last AEX - 05/02/12 Please advise refills.

## 2012-09-01 NOTE — Telephone Encounter (Signed)
Patient needs her alendronate 70 mg. Please. Patient needs it for this Sunday   [facility]   [place]

## 2012-09-01 NOTE — Telephone Encounter (Signed)
RX filled in refill encounter by Dr. Tresa Res.  Ending follow up in this encounter.

## 2012-09-04 ENCOUNTER — Other Ambulatory Visit: Payer: Self-pay | Admitting: Internal Medicine

## 2012-09-05 ENCOUNTER — Telehealth: Payer: Self-pay | Admitting: Obstetrics and Gynecology

## 2012-09-05 NOTE — Telephone Encounter (Signed)
Patient needs refill on Alendronate .  [facility] [place] [phone]

## 2012-09-05 NOTE — Telephone Encounter (Signed)
Called patient to confirm that fosamax was sent through to [facility] pharmacy 09/01/12, patient's husband gets on the phone and states how Pharmacy has been giving them the run around and would their prescription sent to [facility] instead. [facility] called in Alendronate (Fosamax) 70 mg #12/0rfs, patient is aware.

## 2012-10-25 ENCOUNTER — Ambulatory Visit: Payer: Medicare Other | Admitting: Internal Medicine

## 2012-10-30 ENCOUNTER — Other Ambulatory Visit: Payer: Self-pay | Admitting: Internal Medicine

## 2012-10-31 ENCOUNTER — Other Ambulatory Visit: Payer: Self-pay | Admitting: Internal Medicine

## 2012-10-31 ENCOUNTER — Encounter: Payer: Self-pay | Admitting: Internal Medicine

## 2012-10-31 ENCOUNTER — Ambulatory Visit (INDEPENDENT_AMBULATORY_CARE_PROVIDER_SITE_OTHER): Payer: Medicare Other | Admitting: Internal Medicine

## 2012-10-31 VITALS — BP 130/80 | HR 76 | Temp 98.2°F | Resp 16 | Ht 63.0 in | Wt 206.0 lb

## 2012-10-31 DIAGNOSIS — J45909 Unspecified asthma, uncomplicated: Secondary | ICD-10-CM

## 2012-10-31 DIAGNOSIS — F0391 Unspecified dementia with behavioral disturbance: Secondary | ICD-10-CM

## 2012-10-31 DIAGNOSIS — I10 Essential (primary) hypertension: Secondary | ICD-10-CM

## 2012-10-31 DIAGNOSIS — R413 Other amnesia: Secondary | ICD-10-CM | POA: Insufficient documentation

## 2012-10-31 DIAGNOSIS — E1165 Type 2 diabetes mellitus with hyperglycemia: Secondary | ICD-10-CM

## 2012-10-31 DIAGNOSIS — J449 Chronic obstructive pulmonary disease, unspecified: Secondary | ICD-10-CM

## 2012-10-31 LAB — COMPREHENSIVE METABOLIC PANEL
AST: 21 U/L (ref 0–37)
Albumin: 3.7 g/dL (ref 3.5–5.2)
Alkaline Phosphatase: 56 U/L (ref 39–117)
Potassium: 4.3 mEq/L (ref 3.5–5.1)
Sodium: 139 mEq/L (ref 135–145)
Total Protein: 7.2 g/dL (ref 6.0–8.3)

## 2012-10-31 LAB — HEMOGLOBIN A1C: Hgb A1c MFr Bld: 8.8 % — ABNORMAL HIGH (ref 4.6–6.5)

## 2012-10-31 MED ORDER — DONEPEZIL HCL 23 MG PO TABS: 23.0000 mg | ORAL_TABLET | Freq: Every day | ORAL | Status: DC

## 2012-10-31 NOTE — Patient Instructions (Signed)
Still significant short-term memory loss we'll increase the Aricept to the extended release 23 mg

## 2012-10-31 NOTE — Progress Notes (Signed)
Subjective:    Patient ID: Denise [name], female    DOB: 1940/05/13, 72 y.o.   MRN: [id number]  HPI  Patient presents for followup of memory loss on Aricept she is tolerating the medication refill we can titrate that to 20 mg to help to see if we can improve or stabilize memory loss.  Short-term memory is still a significant issue and there is a feeling of the family that this has progressed although she can reverse world she has not been doing mental exercises such as puzzles and word findings  Review of Systems  Constitutional: Negative for activity change, appetite change and fatigue.  HENT: Negative for congestion, ear pain, postnasal drip and sinus pressure.   Eyes: Negative for redness and visual disturbance.  Respiratory: Negative for cough, shortness of breath and wheezing.   Gastrointestinal: Negative for abdominal pain and abdominal distention.  Genitourinary: Negative for dysuria, frequency and menstrual problem.  Musculoskeletal: Negative for arthralgias, joint swelling, myalgias and neck pain.  Skin: Negative for rash and wound.  Neurological: Negative for dizziness, weakness and headaches.  Hematological: Negative for adenopathy. Does not bruise/bleed easily.  Psychiatric/Behavioral: Negative for sleep disturbance and decreased concentration.       Objective:   Physical Exam  Nursing note and vitals reviewed. Constitutional: She is oriented to person, place, and time. She appears well-developed and well-nourished. No distress.  HENT:  Head: Normocephalic and atraumatic.  Right Ear: External ear normal.  Left Ear: External ear normal.  Eyes: Conjunctivae and EOM are normal. Pupils are equal, round, and reactive to light.  Neck: Normal range of motion. Neck supple. No JVD present. No tracheal deviation present. No thyromegaly present.  Cardiovascular: Normal rate and regular rhythm.   Pulmonary/Chest: Effort normal and breath sounds normal. She has no wheezes. She  exhibits no tenderness.  Abdominal: Soft. Bowel sounds are normal.  Musculoskeletal: Normal range of motion. She exhibits no edema and no tenderness.  Lymphadenopathy:    She has no cervical adenopathy.  Neurological: She is alert and oriented to person, place, and time. She has normal reflexes. No cranial nerve deficit.  Skin: Skin is warm and dry. She is not diaphoretic.  Psychiatric: She has a normal mood and affect. Her behavior is normal.   . Past Medical History  Diagnosis Date  . Asthma   . COPD (chronic obstructive pulmonary disease)   . GERD (gastroesophageal reflux disease)   . Arthritis   . Diabetes mellitus   . Hyperlipidemia   . Hypertension   . Hx of atrial fibrillation, no current medication   . Osteopenia   . Asthma   . Cataracts, bilateral 12/2010  . Plantar fasciitis 2010  . Carpal tunnel syndrome 09/2006    bilateral     History   Social History  . Marital Status: Married    Spouse Name: N/A    Number of Children: N/A  . Years of Education: N/A   Occupational History  . Not on file.   Social History Main Topics  . Smoking status: Never Smoker   . Smokeless tobacco: Not on file  . Alcohol Use: No  . Drug Use: No  . Sexual Activity: Yes    Partners: Male    Birth Control/ Protection: Post-menopausal   Other Topics Concern  . Not on file   Social History Narrative  . No narrative on file    Past Surgical History  Procedure Laterality Date  . Colopnoscopy  05/17/2001  . Tonsillectomy    .  Oophorectomy cyst    . Cardiac ablation in 2001 for a fib    . Carpal tunnel release Bilateral 09/2006  . Breast biopsy Left 2001     Family History  Problem Relation Age of Onset  . Heart disease Mother     Allergies  Allergen Reactions  . Codeine   . Sulfonamide Derivatives     Current Outpatient Prescriptions on File Prior to Visit  Medication Sig Dispense Refill  . ACCU-CHEK FASTCLIX LANCETS MISC 1 each by Does not apply route daily.  102  each  3  . ACCU-CHEK FASTCLIX LANCETS MISC 1 each by Does not apply route daily.  102 each  6  . albuterol (PROVENTIL HFA;VENTOLIN HFA) 108 (90 BASE) MCG/ACT inhaler Inhale 2 puffs into the lungs every 6 (six) hours as needed.  4 Inhaler  3  . alendronate (FOSAMAX) 70 MG tablet TAKE ONE TABLET BY MOUTH ONCE A WEEK  12 tablet  0  . aspirin 325 MG tablet Take 325 mg by mouth daily.        Marland Kitchen atorvastatin (LIPITOR) 20 MG tablet Take 1 tablet (20 mg total) by mouth daily.  90 tablet  3  . Calcium Carbonate-Vitamin D (CALTRATE 600+D PO) Take by mouth 2 (two) times daily.        . fish oil-omega-3 fatty acids 1000 MG capsule Take 2 g by mouth daily.        . Fluticasone-Salmeterol (ADVAIR DISKUS) 100-50 MCG/DOSE AEPB Inhale 1 puff into the lungs 2 (two) times daily.  180 each  3  . glucose blood (ACCU-CHEK SMARTVIEW) test strip 1 each by Other route daily. Use as instructed  102 each  6  . glyBURIDE (DIABETA) 5 MG tablet TAKE ONE TABLET BY MOUTH TWICE DAILY  60 tablet  6  . insulin glargine (LANTUS) 100 UNIT/ML injection Inject 0.25 mLs (25 Units total) into the skin at bedtime.  10 mL  6  . Insulin Pen Needle (BD PEN NEEDLE NANO U/F) 32G X 4 MM MISC 1 qd  100 each  6  . linagliptin (TRADJENTA) 5 MG TABS tablet Take 1 tablet (5 mg total) by mouth daily.  30 tablet    . multivitamin (THERAGRAN) per tablet Take 1 tablet by mouth daily.        . Potassium 99 MG TABS Take by mouth 2 (two) times daily.        . ramipril (ALTACE) 10 MG capsule Pt lost several pills- this is an early refill!!please fill  90 capsule  3  . ranitidine (ZANTAC) 150 MG tablet TAKE ONE TABLET BY MOUTH TWICE DAILY  180 tablet  3  . Vitamin D, Ergocalciferol, (DRISDOL) 50000 UNITS CAPS TAKE ONE CAPSULE BY MOUTH EVERY OTHER WEEK  6 capsule  2   No current facility-administered medications on file prior to visit.    BP 130/80  Pulse 76  Temp(Src) 98.2 F (36.8 C)  Resp 16  Ht 5\' 3"  (1.6 m)  Wt 206 lb (93.441 kg)  BMI 36.5  kg/m2  LMP 01/19/1996          Assessment & Plan:  Memory loss Can reverse word On aricept 5 and consider increased dose Short term questions  Have sent a prescription for the 23 mg Aricept to [facility].  We'll monitor hemoglobin A1c and compensated metabolic panel to look at liver function and renal function on current medications

## 2012-11-09 ENCOUNTER — Other Ambulatory Visit: Payer: Self-pay | Admitting: *Deleted

## 2012-11-09 ENCOUNTER — Telehealth: Payer: Self-pay | Admitting: Internal Medicine

## 2012-11-09 MED ORDER — LINAGLIPTIN 5 MG PO TABS: 5.0000 mg | ORAL_TABLET | Freq: Every day | ORAL | Status: DC

## 2012-11-09 NOTE — Telephone Encounter (Signed)
Pt request refill of linagliptin (TRADJENTA) 5 MG TABS tablet [facility]

## 2012-11-09 NOTE — Telephone Encounter (Signed)
Done

## 2012-11-13 ENCOUNTER — Other Ambulatory Visit: Payer: Self-pay | Admitting: *Deleted

## 2012-11-13 ENCOUNTER — Telehealth: Payer: Self-pay | Admitting: Internal Medicine

## 2012-11-13 DIAGNOSIS — I10 Essential (primary) hypertension: Secondary | ICD-10-CM

## 2012-11-13 MED ORDER — RAMIPRIL 10 MG PO CAPS: ORAL_CAPSULE | ORAL | Status: DC

## 2012-11-13 MED ORDER — RAMIPRIL 10 MG PO CAPS: 10.0000 mg | ORAL_CAPSULE | Freq: Every day | ORAL | Status: DC

## 2012-11-13 NOTE — Telephone Encounter (Signed)
none

## 2012-11-13 NOTE — Telephone Encounter (Signed)
Pt needs new rx ramipril 10 mg #90 with refills sent to [facility].

## 2012-11-20 ENCOUNTER — Other Ambulatory Visit: Payer: Self-pay | Admitting: *Deleted

## 2012-11-20 ENCOUNTER — Telehealth: Payer: Self-pay | Admitting: Internal Medicine

## 2012-11-20 MED ORDER — RANITIDINE HCL 150 MG PO TABS: ORAL_TABLET | ORAL | Status: DC

## 2012-11-20 NOTE — Telephone Encounter (Signed)
Pt informed and med sent to [facility] requested

## 2012-11-20 NOTE — Telephone Encounter (Signed)
Pt's spouse called in, he would like the results of labs from 10/31/12.  Also pt is requesting a refill of ranitidine (ZANTAC) 150 MG tablet. Please advise.

## 2013-01-01 ENCOUNTER — Other Ambulatory Visit: Payer: Self-pay | Admitting: *Deleted

## 2013-01-01 MED ORDER — FLUTICASONE-SALMETEROL 100-50 MCG/DOSE IN AEPB: 1.0000 | INHALATION_SPRAY | Freq: Two times a day (BID) | RESPIRATORY_TRACT | Status: DC

## 2013-02-14 ENCOUNTER — Telehealth: Payer: Self-pay | Admitting: Internal Medicine

## 2013-02-14 ENCOUNTER — Other Ambulatory Visit: Payer: Self-pay | Admitting: *Deleted

## 2013-02-14 MED ORDER — FLUTICASONE-SALMETEROL 100-50 MCG/DOSE IN AEPB: 1.0000 | INHALATION_SPRAY | Freq: Two times a day (BID) | RESPIRATORY_TRACT | Status: DC

## 2013-02-14 MED ORDER — LINAGLIPTIN 5 MG PO TABS: 5.0000 mg | ORAL_TABLET | Freq: Every day | ORAL | Status: DC

## 2013-02-14 NOTE — Telephone Encounter (Signed)
[facility] requesting new script for temazepam (RESTORIL) 30 MG capsule.

## 2013-03-02 ENCOUNTER — Encounter: Payer: Self-pay | Admitting: Internal Medicine

## 2013-03-02 ENCOUNTER — Other Ambulatory Visit (INDEPENDENT_AMBULATORY_CARE_PROVIDER_SITE_OTHER): Payer: Medicare Other | Admitting: *Deleted

## 2013-03-02 ENCOUNTER — Ambulatory Visit (INDEPENDENT_AMBULATORY_CARE_PROVIDER_SITE_OTHER): Payer: Medicare Other | Admitting: Internal Medicine

## 2013-03-02 VITALS — BP 140/76 | HR 76 | Temp 98.2°F | Resp 16 | Ht 63.0 in | Wt 202.0 lb

## 2013-03-02 DIAGNOSIS — Z23 Encounter for immunization: Secondary | ICD-10-CM

## 2013-03-02 DIAGNOSIS — E1169 Type 2 diabetes mellitus with other specified complication: Principal | ICD-10-CM

## 2013-03-02 DIAGNOSIS — IMO0002 Reserved for concepts with insufficient information to code with codable children: Secondary | ICD-10-CM

## 2013-03-02 DIAGNOSIS — E1165 Type 2 diabetes mellitus with hyperglycemia: Secondary | ICD-10-CM

## 2013-03-02 LAB — HEMOGLOBIN A1C: HEMOGLOBIN A1C: 9.2 % — AB (ref 4.6–6.5)

## 2013-03-02 NOTE — Addendum Note (Signed)
Addended by: Willy EddyWILLIAMS, Jiles Goya M on: 03/02/2013 03:28 PM   Modules accepted: Orders

## 2013-03-02 NOTE — Progress Notes (Signed)
Subjective:    Patient ID: Denise [name], female    DOB: [birth year], 73 y.o.   MRN: [id number]  Diabetes  Hyperlipidemia  Hypertension  Gastrophageal Reflux    Concerned about the memory disorder aricept Is irritated by the memory loss ( husband) Insulin and memory loss CBG's variable   Review of Systems Past Medical History  Diagnosis Date  . Asthma   . COPD (chronic obstructive pulmonary disease)   . GERD (gastroesophageal reflux disease)   . Arthritis   . Diabetes mellitus   . Hyperlipidemia   . Hypertension   . Hx of atrial fibrillation, no current medication   . Osteopenia   . Asthma   . Cataracts, bilateral 12/2010  . Plantar fasciitis 2010  . Carpal tunnel syndrome 09/2006    bilateral     History   Social History  . Marital Status: Married    Spouse Name: N/A    Number of Children: N/A  . Years of Education: N/A   Occupational History  . Not on file.   Social History Main Topics  . Smoking status: Never Smoker   . Smokeless tobacco: Not on file  . Alcohol Use: No  . Drug Use: No  . Sexual Activity: Yes    Partners: Male    Birth Control/ Protection: Post-menopausal   Other Topics Concern  . Not on file   Social History Narrative  . No narrative on file    Past Surgical History  Procedure Laterality Date  . Colopnoscopy  05/17/2001  . Tonsillectomy    . Oophorectomy cyst    . Cardiac ablation in 2001 for a fib    . Carpal tunnel release Bilateral 09/2006  . Breast biopsy Left 2001     Family History  Problem Relation Age of Onset  . Heart disease Mother     Allergies  Allergen Reactions  . Codeine   . Sulfonamide Derivatives     Current Outpatient Prescriptions on File Prior to Visit  Medication Sig Dispense Refill  . ACCU-CHEK FASTCLIX LANCETS MISC 1 each by Does not apply route daily.  102 each  3  . ACCU-CHEK FASTCLIX LANCETS MISC 1 each by Does not apply route daily.  102 each  6  . albuterol (PROVENTIL  HFA;VENTOLIN HFA) 108 (90 BASE) MCG/ACT inhaler Inhale 2 puffs into the lungs every 6 (six) hours as needed.  4 Inhaler  3  . alendronate (FOSAMAX) 70 MG tablet TAKE ONE TABLET BY MOUTH ONCE A WEEK  12 tablet  0  . aspirin 325 MG tablet Take 325 mg by mouth daily.        Marland Kitchen atorvastatin (LIPITOR) 20 MG tablet Take 1 tablet (20 mg total) by mouth daily.  90 tablet  3  . Calcium Carbonate-Vitamin D (CALTRATE 600+D PO) Take by mouth 2 (two) times daily.        Marland Kitchen donepezil (ARICEPT) 23 MG TABS tablet Take 1 tablet (23 mg total) by mouth at bedtime.  30 tablet  11  . fish oil-omega-3 fatty acids 1000 MG capsule Take 2 g by mouth daily.        . Fluticasone-Salmeterol (ADVAIR DISKUS) 100-50 MCG/DOSE AEPB Inhale 1 puff into the lungs 2 (two) times daily.  60 each  3  . glucose blood (ACCU-CHEK SMARTVIEW) test strip 1 each by Other route daily. Use as instructed  102 each  6  . glyBURIDE (DIABETA) 5 MG tablet TAKE ONE TABLET BY MOUTH TWICE DAILY  60 tablet  6  . insulin glargine (LANTUS) 100 UNIT/ML injection Inject 0.25 mLs (25 Units total) into the skin at bedtime.  10 mL  6  . Insulin Pen Needle (BD PEN NEEDLE NANO U/F) 32G X 4 MM MISC 1 qd  100 each  6  . linagliptin (TRADJENTA) 5 MG TABS tablet Take 1 tablet (5 mg total) by mouth daily.  30 tablet    . linagliptin (TRADJENTA) 5 MG TABS tablet Take 1 tablet (5 mg total) by mouth daily.  90 tablet  3  . metFORMIN (GLUCOPHAGE) 500 MG tablet TAKE 1 TABLET (500 MG TOTAL) BY MOUTH 2 (TWO) TIMES DAILY WITH A MEAL.  60 tablet  10  . multivitamin (THERAGRAN) per tablet Take 1 tablet by mouth daily.        . Potassium 99 MG TABS Take by mouth 2 (two) times daily.        . ramipril (ALTACE) 10 MG capsule Take 1 capsule (10 mg total) by mouth daily.  90 capsule  3  . ranitidine (ZANTAC) 150 MG tablet TAKE ONE TABLET BY MOUTH TWICE DAILY  180 tablet  3  . Vitamin D, Ergocalciferol, (DRISDOL) 50000 UNITS CAPS TAKE ONE CAPSULE BY MOUTH EVERY OTHER WEEK  6 capsule  2    No current facility-administered medications on file prior to visit.    BP 140/76  Pulse 76  Temp(Src) 98.2 F (36.8 C)  Resp 16  Ht 5\' 3"  (1.6 m)  Wt 202 lb (91.627 kg)  BMI 35.79 kg/m2  LMP 01/19/1996       Objective:   Physical Exam  Constitutional: She is oriented to person, place, and time. She appears well-developed and well-nourished. No distress.  HENT:  Head: Normocephalic and atraumatic.  Eyes: Conjunctivae and EOM are normal. Pupils are equal, round, and reactive to light.  Neck: Normal range of motion. Neck supple. No JVD present. No tracheal deviation present. No thyromegaly present.  Cardiovascular: Normal rate and regular rhythm.   Murmur heard. Pulmonary/Chest: Effort normal and breath sounds normal. She has no wheezes. She exhibits no tenderness.  Abdominal: Soft. Bowel sounds are normal.  Musculoskeletal: Normal range of motion. She exhibits no edema and no tenderness.  Lymphadenopathy:    She has no cervical adenopathy.  Neurological: She is alert and oriented to person, place, and time. She has normal reflexes. No cranial nerve deficit.  Skin: Skin is warm and dry. She is not diaphoretic.  Psychiatric:  Memory loss and sundowning          Assessment & Plan:  High glucoses monitor AIC discussion of Aricept Insulin regimen prevnar and tetnus

## 2013-03-02 NOTE — Addendum Note (Signed)
Addended by: Bonnye FavaKWEI, NANA K on: 03/02/2013 11:41 AM   Modules accepted: Orders

## 2013-03-07 ENCOUNTER — Telehealth: Payer: Self-pay | Admitting: Internal Medicine

## 2013-03-07 NOTE — Telephone Encounter (Signed)
Pt would like blood work results °

## 2013-03-09 ENCOUNTER — Telehealth: Payer: Self-pay

## 2013-03-09 NOTE — Telephone Encounter (Signed)
Education Mailed to pt

## 2013-05-15 ENCOUNTER — Encounter: Payer: Self-pay | Admitting: Internal Medicine

## 2013-06-08 ENCOUNTER — Ambulatory Visit (INDEPENDENT_AMBULATORY_CARE_PROVIDER_SITE_OTHER): Payer: Medicare Other | Admitting: Internal Medicine

## 2013-06-08 ENCOUNTER — Encounter: Payer: Self-pay | Admitting: Internal Medicine

## 2013-06-08 VITALS — BP 138/76 | HR 75 | Temp 98.1°F | Wt 196.0 lb

## 2013-06-08 DIAGNOSIS — K219 Gastro-esophageal reflux disease without esophagitis: Secondary | ICD-10-CM

## 2013-06-08 DIAGNOSIS — M1990 Unspecified osteoarthritis, unspecified site: Secondary | ICD-10-CM

## 2013-06-08 DIAGNOSIS — E1169 Type 2 diabetes mellitus with other specified complication: Secondary | ICD-10-CM

## 2013-06-08 DIAGNOSIS — F0390 Unspecified dementia without behavioral disturbance: Secondary | ICD-10-CM

## 2013-06-08 DIAGNOSIS — IMO0002 Reserved for concepts with insufficient information to code with codable children: Secondary | ICD-10-CM

## 2013-06-08 DIAGNOSIS — I10 Essential (primary) hypertension: Secondary | ICD-10-CM

## 2013-06-08 DIAGNOSIS — E785 Hyperlipidemia, unspecified: Secondary | ICD-10-CM

## 2013-06-08 DIAGNOSIS — J4489 Other specified chronic obstructive pulmonary disease: Secondary | ICD-10-CM

## 2013-06-08 DIAGNOSIS — J449 Chronic obstructive pulmonary disease, unspecified: Secondary | ICD-10-CM

## 2013-06-08 DIAGNOSIS — M949 Disorder of cartilage, unspecified: Secondary | ICD-10-CM

## 2013-06-08 DIAGNOSIS — E669 Obesity, unspecified: Secondary | ICD-10-CM | POA: Insufficient documentation

## 2013-06-08 DIAGNOSIS — M899 Disorder of bone, unspecified: Secondary | ICD-10-CM

## 2013-06-08 DIAGNOSIS — E1165 Type 2 diabetes mellitus with hyperglycemia: Secondary | ICD-10-CM

## 2013-06-08 MED ORDER — MEMANTINE HCL 5 MG PO TABS: 5.0000 mg | ORAL_TABLET | Freq: Two times a day (BID) | ORAL | Status: DC

## 2013-06-08 NOTE — Assessment & Plan Note (Signed)
Controlled with prn aleve

## 2013-06-08 NOTE — Progress Notes (Signed)
HPI  Pt presents to the clinic today to establish care. She is transferring care from Dr. Lovell Sheehan. She has no concerns today.  COPD: Well controlled on albuterol and advair.  GERD: She reports she rarely has reflux. She would like to come off the zantac for a while to see if she really needs it.  Arthritis: Not really bad. Takes occasional aleve.  DM2: Last A1C 8.2 03/2013. Her blood sugar is usually below 150 but has seen numbers as high as 225. She does love sweets but reports she does make sure they are sugar free. She is on metformin, glipizide, tradjenta and lantus- 20 units at night. She would like to come off the tradjenta. It is expensive.  HTN: Well controlled on ramipril. She would like to try coming of this as well.  HLD: No issues on lipitor  Dementia: On aricept. Husband has noticed a slight decline in short term memory.  Flu: 07/2012 Tetanus: 02/2013 Pneumovax: 02/2013 Pap Smear: non longer screening Mammogram: 2015 Bone Density: 2014 Colonoscopy: 2014 Eye Doctor: yearly Dentist: yearly Past Medical History  Diagnosis Date  . Asthma   . COPD (chronic obstructive pulmonary disease)   . GERD (gastroesophageal reflux disease)   . Arthritis   . Diabetes mellitus   . Hyperlipidemia   . Hypertension   . Hx of atrial fibrillation, no current medication   . Osteopenia   . Asthma   . Cataracts, bilateral 12/2010  . Plantar fasciitis 2010  . Carpal tunnel syndrome 09/2006    bilateral     Current Outpatient Prescriptions  Medication Sig Dispense Refill  . ACCU-CHEK FASTCLIX LANCETS MISC 1 each by Does not apply route daily.  102 each  3  . ACCU-CHEK FASTCLIX LANCETS MISC 1 each by Does not apply route daily.  102 each  6  . albuterol (PROVENTIL HFA;VENTOLIN HFA) 108 (90 BASE) MCG/ACT inhaler Inhale 2 puffs into the lungs every 6 (six) hours as needed.  4 Inhaler  3  . alendronate (FOSAMAX) 70 MG tablet TAKE ONE TABLET BY MOUTH ONCE A WEEK  12 tablet  0  . aspirin 325  MG tablet Take 325 mg by mouth daily.        Marland Kitchen atorvastatin (LIPITOR) 20 MG tablet Take 1 tablet (20 mg total) by mouth daily.  90 tablet  3  . Calcium Carbonate-Vitamin D (CALTRATE 600+D PO) Take by mouth 2 (two) times daily.        Marland Kitchen donepezil (ARICEPT) 23 MG TABS tablet Take 1 tablet (23 mg total) by mouth at bedtime.  30 tablet  11  . fish oil-omega-3 fatty acids 1000 MG capsule Take 2 g by mouth daily.        . Fluticasone-Salmeterol (ADVAIR DISKUS) 100-50 MCG/DOSE AEPB Inhale 1 puff into the lungs 2 (two) times daily.  60 each  3  . glucose blood (ACCU-CHEK SMARTVIEW) test strip 1 each by Other route daily. Use as instructed  102 each  6  . glyBURIDE (DIABETA) 5 MG tablet TAKE ONE TABLET BY MOUTH TWICE DAILY  60 tablet  6  . insulin glargine (LANTUS) 100 UNIT/ML injection Inject 0.25 mLs (25 Units total) into the skin at bedtime.  10 mL  6  . Insulin Pen Needle (BD PEN NEEDLE NANO U/F) 32G X 4 MM MISC 1 qd  100 each  6  . linagliptin (TRADJENTA) 5 MG TABS tablet Take 1 tablet (5 mg total) by mouth daily.  90 tablet  3  .  metFORMIN (GLUCOPHAGE) 500 MG tablet TAKE 1 TABLET (500 MG TOTAL) BY MOUTH 2 (TWO) TIMES DAILY WITH A MEAL.  60 tablet  10  . multivitamin (THERAGRAN) per tablet Take 1 tablet by mouth daily.        . Potassium 99 MG TABS Take by mouth 2 (two) times daily.        . ramipril (ALTACE) 10 MG capsule Take 1 capsule (10 mg total) by mouth daily.  90 capsule  3  . ranitidine (ZANTAC) 150 MG tablet TAKE ONE TABLET BY MOUTH TWICE DAILY  180 tablet  3  . Vitamin D, Ergocalciferol, (DRISDOL) 50000 UNITS CAPS TAKE ONE CAPSULE BY MOUTH EVERY OTHER WEEK  6 capsule  2   No current facility-administered medications for this visit.    Allergies  Allergen Reactions  . Codeine   . Sulfonamide Derivatives     Family History  Problem Relation Age of Onset  . Heart disease Mother     History   Social History  . Marital Status: Married    Spouse Name: N/A    Number of Children:  N/A  . Years of Education: N/A   Occupational History  . Not on file.   Social History Main Topics  . Smoking status: Never Smoker   . Smokeless tobacco: Not on file  . Alcohol Use: No  . Drug Use: No  . Sexual Activity: Yes    Partners: Male    Birth Control/ Protection: Post-menopausal   Other Topics Concern  . Not on file   Social History Narrative  . No narrative on file    ROS:  Constitutional: Denies fever, malaise, fatigue, headache or abrupt weight changes.  Respiratory: Denies difficulty breathing, shortness of breath, cough or sputum production.   Cardiovascular: Denies chest pain, chest tightness, palpitations or swelling in the hands or feet.  Gastrointestinal: Denies abdominal pain, bloating, constipation, diarrhea or blood in the stool.  Neurological: Pt reports difficulty with memory. Denies dizziness, difficulty with speech or problems with balance and coordination.   No other specific complaints in a complete review of systems (except as listed in HPI above).  PE:  BP 138/76  Pulse 75  Temp(Src) 98.1 F (36.7 C) (Oral)  Wt 196 lb (88.905 kg)  SpO2 98%  LMP 01/19/1996 Wt Readings from Last 3 Encounters:  06/08/13 196 lb (88.905 kg)  03/02/13 202 lb (91.627 kg)  10/31/12 206 lb (93.441 kg)    General: Appears her stated age, obese but well developed, well nourished in NAD. Cardiovascular: Normal rate and rhythm. S1,S2 noted.  No murmur, rubs or gallops noted. No JVD or BLE edema. No carotid bruits noted. Pulmonary/Chest: Normal effort and positive vesicular breath sounds. No respiratory distress. No wheezes, rales or ronchi noted.  Abdomen: Soft and nontender. Normal bowel sounds, no bruits noted. No distention or masses noted. Liver, spleen and kidneys non palpable. Neurological: Alert and oriented. Cranial nerves II-XII intact. Coordination normal. +DTRs bilaterally.  BMET    Component Value Date/Time   NA 139 10/31/2012 1139   K 4.3  10/31/2012 1139   CL 102 10/31/2012 1139   CO2 28 10/31/2012 1139   GLUCOSE 169* 10/31/2012 1139   BUN 11 10/31/2012 1139   CREATININE 0.7 10/31/2012 1139   CALCIUM 9.7 10/31/2012 1139   GFRNONAA 94.08 10/06/2009 0831   GFRAA 107 12/08/2007 1052    Lipid Panel     Component Value Date/Time   CHOL 143 12/13/2011 0923   TRIG 92.0  12/13/2011 0923   HDL 56.10 12/13/2011 0923   CHOLHDL 3 12/13/2011 0923   VLDL 18.4 12/13/2011 0923   LDLCALC 69 12/13/2011 0923    CBC    Component Value Date/Time   WBC 10.1 12/08/2007 1052   RBC 4.07 12/08/2007 1052   HGB 12.9 12/08/2007 1052   HCT 37.4 12/08/2007 1052   PLT 261 12/08/2007 1052   MCV 92.0 12/08/2007 1052   MCHC 34.6 12/08/2007 1052   RDW 13.3 12/08/2007 1052   MONOABS 0.5 12/08/2007 1052   EOSABS 0.0 12/08/2007 1052   BASOSABS 0.0 12/08/2007 1052    Hgb A1C Lab Results  Component Value Date   HGBA1C 9.2* 03/02/2013     Assessment and Plan:

## 2013-06-08 NOTE — Patient Instructions (Addendum)
Dementia Dementia is a general term for problems with brain function. A person with dementia has memory loss and a hard time with at least one other brain function such as thinking, speaking, or problem solving. Dementia can affect social functioning, how you do your job, your mood, or your personality. The changes may be hidden for a long time. The earliest forms of this disease are usually not detected by family or friends. Dementia can be:  Irreversible.  Potentially reversible.  Partially reversible.  Progressive. This means it can get worse over time. CAUSES  Irreversible dementia causes may include:  Degeneration of brain cells (Alzheimer's disease or lewy body dementia).  Multiple small strokes (vascular dementia).  Infection (chronic meningitis or Creutzfelt-Jakob disease).  Frontotemporal dementia. This affects younger people, age 40 to 70, compared to those who have Alzheimer's disease.  Dementia associated with other disorders like Parkinson's disease, Huntington's disease, or HIV-associated dementia. Potentially or partially reversible dementia causes may include:  Medicines.  Metabolic causes such as excessive alcohol intake, vitamin B12 deficiency, or thyroid disease.  Masses or pressure in the brain such as a tumor, blood clot, or hydrocephalus. SYMPTOMS  Symptoms are often hard to detect. Family members or coworkers may not notice them early in the disease process. Different people with dementia may have different symptoms. Symptoms can include:  A hard time with memory, especially recent memory. Long-term memory may not be impaired.  Asking the same question multiple times or forgetting something someone just said.  A hard time speaking your thoughts or finding certain words.  A hard time solving problems or performing familiar tasks (such as how to use a telephone).  Sudden changes in mood.  Changes in personality, especially increasing moodiness or  mistrust.  Depression.  A hard time understanding complex ideas that were never a problem in the past. DIAGNOSIS  There are no specific tests for dementia.   Your caregiver may recommend a thorough evaluation. This is because some forms of dementia can be reversible. The evaluation will likely include a physical exam and getting a detailed history from you and a family member. The history often gives the best clues and suggestions for a diagnosis.  Memory testing may be done. A detailed brain function evaluation called neuropsychologic testing may be helpful.  Lab tests and brain imaging (such as a CT scan or MRI scan) are sometimes important.  Sometimes observation and re-evaluation over time is very helpful. TREATMENT  Treatment depends on the cause.   If the problem is a vitamin deficiency, it may be helped or cured with supplements.  For dementias such as Alzheimer's disease, medicines are available to stabilize or slow the course of the disease. There are no cures for this type of dementia.  Your caregiver can help direct you to groups, organizations, and other caregivers to help with decisions in the care of you or your loved one. HOME CARE INSTRUCTIONS The care of individuals with dementia is varied and dependent upon the progression of the dementia. The following suggestions are intended for the person living with, or caring for, the person with dementia.  Create a safe environment.  Remove the locks on bathroom doors to prevent the person from accidentally locking himself or herself in.  Use childproof latches on kitchen cabinets and any place where cleaning supplies, chemicals, or alcohol are kept.  Use childproof covers in unused electrical outlets.  Install childproof devices to keep doors and windows secured.  Remove stove knobs or install safety   knobs and an automatic shut-off on the stove.  Lower the temperature on water heaters.  Label medicines and keep them  locked up.  Secure knives, lighters, matches, power tools, and guns, and keep these items out of reach.  Keep the house free from clutter. Remove rugs or anything that might contribute to a fall.  Remove objects that might break and hurt the person.  Make sure lighting is good, both inside and outside.  Install grab rails as needed.  Use a monitoring device to alert you to falls or other needs for help.  Reduce confusion.  Keep familiar objects and people around.  Use night lights or dim lights at night.  Label items or areas.  Use reminders, notes, or directions for daily activities or tasks.  Keep a simple, consistent routine for waking, meals, bathing, dressing, and bedtime.  Create a calm, quiet environment.  Place large clocks and calendars prominently.  Display emergency numbers and home address near all telephones.  Use cues to establish different times of the day. An example is to open curtains to let the natural light in during the day.   Use effective communication.  Choose simple words and short sentences.  Use a gentle, calm tone of voice.  Be careful not to interrupt.  If the person is struggling to find a word or communicate a thought, try to provide the word or thought.  Ask one question at a time. Allow the person ample time to answer questions. Repeat the question again if the person does not respond.  Reduce nighttime restlessness.  Provide a comfortable bed.  Have a consistent nighttime routine.  Ensure a regular walking or physical activity schedule. Involve the person in daily activities as much as possible.  Limit napping during the day.  Limit caffeine.  Attend social events that stimulate rather than overwhelm the senses.  Encourage good nutrition and hydration.  Reduce distractions during meal times and snacks.  Avoid foods that are too hot or too cold.  Monitor chewing and swallowing ability.  Continue with routine vision,  hearing, dental, and medical screenings.  Only give over-the-counter or prescription medicines as directed by the caregiver.  Monitor driving abilities. Do not allow the person to drive when safe driving is no longer possible.  Register with an identification program which could provide location assistance in the event of a missing person situation. SEEK MEDICAL CARE IF:   New behavioral problems start such as moodiness, aggressiveness, or seeing things that are not there (hallucinations).  Any new problem with brain function happens. This includes problems with balance, speech, or falling a lot.  Problems with swallowing develop.  Any symptoms of other illness happen. Small changes or worsening in any aspect of brain function can be a sign that the illness is getting worse. It can also be a sign of another medical illness such as infection. Seeing a caregiver right away is important. SEEK IMMEDIATE MEDICAL CARE IF:   A fever develops.  New or worsened confusion develops.  New or worsened sleepiness develops.  Staying awake becomes hard to do. Document Released: 06/30/2000 Document Revised: 03/29/2011 Document Reviewed: 06/01/2010 [employer] Patient Information 2014 [employer].  

## 2013-06-08 NOTE — Assessment & Plan Note (Signed)
Well controlled on lipitor She would like to trial off the medication  Will recheck lipid profile in 3 months

## 2013-06-08 NOTE — Assessment & Plan Note (Signed)
No recent flares on advair and albuterol Advised her to continue current regimen for now

## 2013-06-08 NOTE — Assessment & Plan Note (Signed)
Last bone density showed osteoporosis Continue fosamax

## 2013-06-08 NOTE — Assessment & Plan Note (Signed)
No issues Would like to take a trial off zantac  Advised her if she starts having reflux > 3 x week, she should restart zantac

## 2013-06-08 NOTE — Assessment & Plan Note (Signed)
Well controlled Will trial off of ramipril  RTC in 2 weeks to recheck BP

## 2013-06-08 NOTE — Assessment & Plan Note (Signed)
A1C 8.2% Hold tradjenta x 3 months Increase Lantus to 30 units QHS Continue Metformin and Glipizide Encouraged her to work on her diet and exercise  Will recheck A1C in 3 months

## 2013-06-08 NOTE — Assessment & Plan Note (Signed)
On aricept with worsening short term memory Will start [employer]

## 2013-06-12 ENCOUNTER — Telehealth: Payer: Self-pay | Admitting: Internal Medicine

## 2013-06-12 NOTE — Telephone Encounter (Signed)
Relevant patient education mailed to patient.  

## 2013-06-14 ENCOUNTER — Ambulatory Visit: Payer: Medicare Other | Admitting: Family Medicine

## 2013-06-22 ENCOUNTER — Encounter: Payer: Self-pay | Admitting: Internal Medicine

## 2013-06-22 ENCOUNTER — Ambulatory Visit (INDEPENDENT_AMBULATORY_CARE_PROVIDER_SITE_OTHER): Payer: Medicare Other | Admitting: Internal Medicine

## 2013-06-22 VITALS — BP 142/68 | HR 71 | Temp 98.0°F | Wt 195.0 lb

## 2013-06-22 DIAGNOSIS — F0390 Unspecified dementia without behavioral disturbance: Secondary | ICD-10-CM

## 2013-06-22 DIAGNOSIS — I10 Essential (primary) hypertension: Secondary | ICD-10-CM

## 2013-06-22 NOTE — Assessment & Plan Note (Signed)
Some improvement with namenda in addition to aricept Will continue to monitor

## 2013-06-22 NOTE — Patient Instructions (Signed)

## 2013-06-22 NOTE — Assessment & Plan Note (Signed)
Only slightly elevated off ramipril Will continue to monitor for now

## 2013-06-22 NOTE — Progress Notes (Signed)
Pre visit review using our clinic review tool, if applicable. No additional management support is needed unless otherwise documented below in the visit note. 

## 2013-06-22 NOTE — Progress Notes (Signed)
Subjective:    Patient ID: Denise [name], female    DOB: 1940/09/05, 73 y.o.   MRN: [id number]  HPI  Pt presents to the clinic today for 2 week follow up of HTN. She wanted to try to come off her ramipril. She was not experiencing any side effects but just felt like she was on too many medications. Her BP at the last visit was 138/76. Today her BP is 142/68.  Additionally, her husband does report slight improvement in mental state since starting Namenda.   Review of Systems      Past Medical History  Diagnosis Date  . Asthma   . COPD (chronic obstructive pulmonary disease)   . GERD (gastroesophageal reflux disease)   . Arthritis   . Diabetes mellitus   . Hyperlipidemia   . Hypertension   . Hx of atrial fibrillation, no current medication   . Osteopenia   . Asthma   . Cataracts, bilateral 12/2010  . Plantar fasciitis 2010  . Carpal tunnel syndrome 09/2006    bilateral     Current Outpatient Prescriptions  Medication Sig Dispense Refill  . ACCU-CHEK FASTCLIX LANCETS MISC 1 each by Does not apply route daily.  102 each  3  . ACCU-CHEK FASTCLIX LANCETS MISC 1 each by Does not apply route daily.  102 each  6  . albuterol (PROVENTIL HFA;VENTOLIN HFA) 108 (90 BASE) MCG/ACT inhaler Inhale 2 puffs into the lungs every 6 (six) hours as needed.  4 Inhaler  3  . alendronate (FOSAMAX) 70 MG tablet TAKE ONE TABLET BY MOUTH ONCE A WEEK  12 tablet  0  . aspirin 325 MG tablet Take 325 mg by mouth daily.        . Calcium Carbonate-Vitamin D (CALTRATE 600+D PO) Take by mouth 2 (two) times daily.        Marland Kitchen donepezil (ARICEPT) 23 MG TABS tablet Take 1 tablet (23 mg total) by mouth at bedtime.  30 tablet  11  . fish oil-omega-3 fatty acids 1000 MG capsule Take 2 g by mouth daily.        . Fluticasone-Salmeterol (ADVAIR DISKUS) 100-50 MCG/DOSE AEPB Inhale 1 puff into the lungs 2 (two) times daily.  60 each  3  . glucose blood (ACCU-CHEK SMARTVIEW) test strip 1 each by Other route daily. Use as  instructed  102 each  6  . glyBURIDE (DIABETA) 5 MG tablet TAKE ONE TABLET BY MOUTH TWICE DAILY  60 tablet  6  . insulin glargine (LANTUS) 100 UNIT/ML injection Inject 0.25 mLs (25 Units total) into the skin at bedtime.  10 mL  6  . Insulin Pen Needle (BD PEN NEEDLE NANO U/F) 32G X 4 MM MISC 1 qd  100 each  6  . memantine (NAMENDA) 5 MG tablet Take 1 tablet (5 mg total) by mouth 2 (two) times daily.  30 tablet  2  . metFORMIN (GLUCOPHAGE) 500 MG tablet TAKE 1 TABLET (500 MG TOTAL) BY MOUTH 2 (TWO) TIMES DAILY WITH A MEAL.  60 tablet  10  . multivitamin (THERAGRAN) per tablet Take 1 tablet by mouth daily.        . Potassium 99 MG TABS Take by mouth 2 (two) times daily.        . ranitidine (ZANTAC) 150 MG tablet TAKE ONE TABLET BY MOUTH TWICE DAILY  180 tablet  3  . Vitamin D, Ergocalciferol, (DRISDOL) 50000 UNITS CAPS TAKE ONE CAPSULE BY MOUTH EVERY OTHER WEEK  6 capsule  2  . ramipril (ALTACE) 10 MG capsule Take 1 capsule (10 mg total) by mouth daily.  90 capsule  3   No current facility-administered medications for this visit.    Allergies  Allergen Reactions  . Codeine   . Sulfonamide Derivatives     Family History  Problem Relation Age of Onset  . Heart disease Mother   . Cancer Mother     ovarian  . Diabetes Neg Hx   . Stroke Neg Hx     History   Social History  . Marital Status: Married    Spouse Name: N/A    Number of Children: N/A  . Years of Education: N/A   Occupational History  . Not on file.   Social History Main Topics  . Smoking status: Never Smoker   . Smokeless tobacco: Not on file  . Alcohol Use: No  . Drug Use: No  . Sexual Activity: Yes    Partners: Male    Birth Control/ Protection: Post-menopausal   Other Topics Concern  . Not on file   Social History Narrative  . No narrative on file     Constitutional: Denies fever, malaise, fatigue, headache or abrupt weight changes.  Respiratory: Denies difficulty breathing, shortness of breath, cough  or sputum production.   Cardiovascular: Denies chest pain, chest tightness, palpitations or swelling in the hands or feet.  Neurological: Pt reports difficulty with memory. Denies dizziness, difficulty with speech or problems with balance and coordination.   No other specific complaints in a complete review of systems (except as listed in HPI above).  Objective:   Physical Exam  BP 142/68  Pulse 71  Temp(Src) 98 F (36.7 C) (Oral)  Wt 195 lb (88.451 kg)  SpO2 98%  LMP 01/19/1996 Wt Readings from Last 3 Encounters:  06/22/13 195 lb (88.451 kg)  06/08/13 196 lb (88.905 kg)  03/02/13 202 lb (91.627 kg)    General: Appears her stated age, well developed, well nourished in NAD. Cardiovascular: Normal rate and rhythm. S1,S2 noted.  No murmur, rubs or gallops noted. No JVD or BLE edema. No carotid bruits noted. Pulmonary/Chest: Normal effort and positive vesicular breath sounds. No respiratory distress. No wheezes, rales or ronchi noted.  Neurological: Alert and oriented. Obvious issue with short term memory. Long term memory intact.   BMET    Component Value Date/Time   NA 139 10/31/2012 1139   K 4.3 10/31/2012 1139   CL 102 10/31/2012 1139   CO2 28 10/31/2012 1139   GLUCOSE 169* 10/31/2012 1139   BUN 11 10/31/2012 1139   CREATININE 0.7 10/31/2012 1139   CALCIUM 9.7 10/31/2012 1139   GFRNONAA 94.08 10/06/2009 0831   GFRAA 107 12/08/2007 1052    Lipid Panel     Component Value Date/Time   CHOL 143 12/13/2011 0923   TRIG 92.0 12/13/2011 0923   HDL 56.10 12/13/2011 0923   CHOLHDL 3 12/13/2011 0923   VLDL 18.4 12/13/2011 0923   LDLCALC 69 12/13/2011 0923    CBC    Component Value Date/Time   WBC 10.1 12/08/2007 1052   RBC 4.07 12/08/2007 1052   HGB 12.9 12/08/2007 1052   HCT 37.4 12/08/2007 1052   PLT 261 12/08/2007 1052   MCV 92.0 12/08/2007 1052   MCHC 34.6 12/08/2007 1052   RDW 13.3 12/08/2007 1052   MONOABS 0.5 12/08/2007 1052   EOSABS 0.0 12/08/2007 1052    BASOSABS 0.0 12/08/2007 1052    Hgb A1C Lab Results  Component  Value Date   HGBA1C 9.2* 03/02/2013         Assessment & Plan:

## 2013-06-25 ENCOUNTER — Ambulatory Visit: Payer: Medicare Other | Admitting: Family Medicine

## 2013-07-09 ENCOUNTER — Telehealth: Payer: Self-pay

## 2013-07-09 MED ORDER — MEMANTINE HCL 5 MG PO TABS: 5.0000 mg | ORAL_TABLET | Freq: Every day | ORAL | Status: DC

## 2013-07-09 NOTE — Telephone Encounter (Signed)
Rx sent through e-scribe  

## 2013-07-09 NOTE — Telephone Encounter (Signed)
Received correspondence in reference to pt's Namenda Rx and they want 90 day supply instead--Rx was sent 06/08/13 with 2 refills--please advise as i can call the pharmacy to change

## 2013-07-09 NOTE — Telephone Encounter (Signed)
Change to 90 day supply with 2 refills

## 2013-07-09 NOTE — Addendum Note (Signed)
Addended by: Roena MaladyEVONTENNO, MELANIE Y on: 07/09/2013 03:45 PM   Modules accepted: Orders

## 2013-07-10 ENCOUNTER — Telehealth: Payer: Self-pay

## 2013-07-10 DIAGNOSIS — F03918 Unspecified dementia, unspecified severity, with other behavioral disturbance: Secondary | ICD-10-CM

## 2013-07-10 DIAGNOSIS — E1165 Type 2 diabetes mellitus with hyperglycemia: Principal | ICD-10-CM

## 2013-07-10 DIAGNOSIS — IMO0001 Reserved for inherently not codable concepts without codable children: Secondary | ICD-10-CM

## 2013-07-10 DIAGNOSIS — F0391 Unspecified dementia with behavioral disturbance: Secondary | ICD-10-CM

## 2013-07-10 DIAGNOSIS — J449 Chronic obstructive pulmonary disease, unspecified: Secondary | ICD-10-CM

## 2013-07-10 MED ORDER — DONEPEZIL HCL 23 MG PO TABS: 23.0000 mg | ORAL_TABLET | Freq: Every day | ORAL | Status: DC

## 2013-07-10 NOTE — Telephone Encounter (Signed)
I don't see that in the notes anywhere. He is wants is restarted thought we can send it in. The only followup I see in the computer was about her blood pressure.

## 2013-07-10 NOTE — Telephone Encounter (Signed)
Mr Denise [name] said that [name] was stopped for 2 weeks and pts BS went very high and pt was restarted on Tradjenta. Mr Denise [name] request cb with response because pt is now [facility] Mr [facility]ise [name] request cb.

## 2013-07-10 NOTE — Telephone Encounter (Signed)
[facility] left v/m requesting refill for aricept for 90 day prescription; last filled on 10/31/12 # 30 with 11 refills. Also request refill for Tradjenta Not on current med list.Please advise.

## 2013-07-10 NOTE — Telephone Encounter (Signed)
We are holding the tradjenta for now and going to recheck her A1C in 2 months, Aricept sent to pharmacy

## 2013-07-11 MED ORDER — LINAGLIPTIN 5 MG PO TABS: 5.0000 mg | ORAL_TABLET | Freq: Every day | ORAL | Status: DC

## 2013-07-11 NOTE — Telephone Encounter (Signed)
Mr Dwain Sarnaicke left v/m requesting cb [phone]; Mr Dwain Sarnaicke said [facility] does not have Tradjenta.

## 2013-07-11 NOTE — Telephone Encounter (Signed)
We can send in the Tradjenta if he wants her started back on it.

## 2013-07-11 NOTE — Telephone Encounter (Signed)
Pt's husband left v/m requesting cb about quantity of med sent. Mr Dwain Sarnaicke request cb [phone].

## 2013-07-11 NOTE — Telephone Encounter (Signed)
Rx sent through e-scribe Mr Denise [name] is aware

## 2013-07-11 NOTE — Telephone Encounter (Signed)
Can you please reiterate the second sentence in msg below--unsure what you mean?

## 2013-07-11 NOTE — Addendum Note (Signed)
Addended by: Roena MaladyEVONTENNO, MELANIE Y on: 07/11/2013 11:57 AM   Modules accepted: Orders

## 2013-07-12 ENCOUNTER — Telehealth: Payer: Self-pay

## 2013-07-12 NOTE — Telephone Encounter (Signed)
Is pt supposed to be taking Namenda BID or QD--Rx may have been sent in with the incorrect instructions--please advise so i can let Mr Denise [name] know

## 2013-07-12 NOTE — Telephone Encounter (Signed)
Mr Dwain Sarnaicke is aware

## 2013-07-12 NOTE — Telephone Encounter (Signed)
daily

## 2013-08-13 ENCOUNTER — Ambulatory Visit (INDEPENDENT_AMBULATORY_CARE_PROVIDER_SITE_OTHER): Payer: Medicare Other | Admitting: Internal Medicine

## 2013-08-13 ENCOUNTER — Encounter: Payer: Self-pay | Admitting: Internal Medicine

## 2013-08-13 VITALS — BP 130/68 | HR 69 | Temp 97.5°F | Wt 196.5 lb

## 2013-08-13 DIAGNOSIS — E669 Obesity, unspecified: Secondary | ICD-10-CM

## 2013-08-13 DIAGNOSIS — E1169 Type 2 diabetes mellitus with other specified complication: Secondary | ICD-10-CM

## 2013-08-13 DIAGNOSIS — E785 Hyperlipidemia, unspecified: Secondary | ICD-10-CM

## 2013-08-13 DIAGNOSIS — IMO0002 Reserved for concepts with insufficient information to code with codable children: Secondary | ICD-10-CM

## 2013-08-13 DIAGNOSIS — E1165 Type 2 diabetes mellitus with hyperglycemia: Secondary | ICD-10-CM

## 2013-08-13 DIAGNOSIS — I10 Essential (primary) hypertension: Secondary | ICD-10-CM

## 2013-08-13 LAB — MICROALBUMIN / CREATININE URINE RATIO
CREATININE, U: 218.5 mg/dL
MICROALB UR: 3.3 mg/dL — AB (ref 0.0–1.9)
MICROALB/CREAT RATIO: 1.5 mg/g (ref 0.0–30.0)

## 2013-08-13 LAB — LIPID PANEL
CHOL/HDL RATIO: 3
Cholesterol: 214 mg/dL — ABNORMAL HIGH (ref 0–200)
HDL: 63.5 mg/dL (ref >39.00)
LDL Cholesterol: 119 mg/dL — ABNORMAL HIGH (ref 0–99)
NONHDL: 150.5
TRIGLYCERIDES: 157 mg/dL — AB (ref 0.0–149.0)
VLDL: 31.4 mg/dL (ref 0.0–40.0)

## 2013-08-13 LAB — HEMOGLOBIN A1C: Hgb A1c MFr Bld: 8.9 % — ABNORMAL HIGH (ref 4.6–6.5)

## 2013-08-13 NOTE — Progress Notes (Signed)
Subjective:    Patient ID: Denise [name], female    DOB: [birth year], 73 y.o.   MRN: [id number]  HPI  Pt presents to the clinic today to follow up diabetes, HTN and HLD. She is on Metformin, Glipizide, Tradjenta and Lantus. Her blood sugars have ranged from 97-322. Last lipid profile shows that it is well controlled. She only takes Fish Oil OTC. At her last visit, we had held her ramipril. Her BP at that time was well controlled. Her husband does report that he restarted her Ramipril. Today her BP os 130/68.   Review of Systems      Past Medical History  Diagnosis Date  . Asthma   . COPD (chronic obstructive pulmonary disease)   . GERD (gastroesophageal reflux disease)   . Arthritis   . Diabetes mellitus   . Hyperlipidemia   . Hypertension   . Hx of atrial fibrillation, no current medication   . Osteopenia   . Asthma   . Cataracts, bilateral 12/2010  . Plantar fasciitis 2010  . Carpal tunnel syndrome 09/2006    bilateral     Current Outpatient Prescriptions  Medication Sig Dispense Refill  . ACCU-CHEK FASTCLIX LANCETS MISC 1 each by Does not apply route daily.  102 each  3  . ACCU-CHEK FASTCLIX LANCETS MISC 1 each by Does not apply route daily.  102 each  6  . albuterol (PROVENTIL HFA;VENTOLIN HFA) 108 (90 BASE) MCG/ACT inhaler Inhale 2 puffs into the lungs every 6 (six) hours as needed.  4 Inhaler  3  . alendronate (FOSAMAX) 70 MG tablet TAKE ONE TABLET BY MOUTH ONCE A WEEK  12 tablet  0  . aspirin 325 MG tablet Take 325 mg by mouth daily.        . Calcium Carbonate-Vitamin D (CALTRATE 600+D PO) Take by mouth 2 (two) times daily.        Marland Kitchen. donepezil (ARICEPT) 23 MG TABS tablet Take 1 tablet (23 mg total) by mouth at bedtime.  90 tablet  1  . fish oil-omega-3 fatty acids 1000 MG capsule Take 2 g by mouth daily.        . Fluticasone-Salmeterol (ADVAIR DISKUS) 100-50 MCG/DOSE AEPB Inhale 1 puff into the lungs 2 (two) times daily.  60 each  3  . glucose blood (ACCU-CHEK  SMARTVIEW) test strip 1 each by Other route daily. Use as instructed  102 each  6  . glyBURIDE (DIABETA) 5 MG tablet TAKE ONE TABLET BY MOUTH TWICE DAILY  60 tablet  6  . insulin glargine (LANTUS) 100 UNIT/ML injection Inject 0.25 mLs (25 Units total) into the skin at bedtime.  10 mL  6  . Insulin Pen Needle (BD PEN NEEDLE NANO U/F) 32G X 4 MM MISC 1 qd  100 each  6  . linagliptin (TRADJENTA) 5 MG TABS tablet Take 1 tablet (5 mg total) by mouth daily.  30 tablet  3  . memantine (NAMENDA) 5 MG tablet Take 1 tablet (5 mg total) by mouth daily.  90 tablet  2  . metFORMIN (GLUCOPHAGE) 500 MG tablet TAKE 1 TABLET (500 MG TOTAL) BY MOUTH 2 (TWO) TIMES DAILY WITH A MEAL.  60 tablet  10  . multivitamin (THERAGRAN) per tablet Take 1 tablet by mouth daily.        . Potassium 99 MG TABS Take by mouth 2 (two) times daily.        . ramipril (ALTACE) 10 MG capsule Take 1 capsule (10 mg  total) by mouth daily.  90 capsule  3  . ranitidine (ZANTAC) 150 MG tablet TAKE ONE TABLET BY MOUTH TWICE DAILY  180 tablet  3  . Vitamin D, Ergocalciferol, (DRISDOL) 50000 UNITS CAPS TAKE ONE CAPSULE BY MOUTH EVERY OTHER WEEK  6 capsule  2   No current facility-administered medications for this visit.    Allergies  Allergen Reactions  . Codeine   . Sulfonamide Derivatives     Family History  Problem Relation Age of Onset  . Heart disease Mother   . Cancer Mother     ovarian  . Diabetes Neg Hx   . Stroke Neg Hx     History   Social History  . Marital Status: Married    Spouse Name: N/A    Number of Children: N/A  . Years of Education: N/A   Occupational History  . Not on file.   Social History Main Topics  . Smoking status: Never Smoker   . Smokeless tobacco: Not on file  . Alcohol Use: No  . Drug Use: No  . Sexual Activity: Yes    Partners: Male    Birth Control/ Protection: Post-menopausal   Other Topics Concern  . Not on file   Social History Narrative  . No narrative on file      Constitutional: Denies fever, malaise, fatigue, headache or abrupt weight changes.  Respiratory: Denies difficulty breathing, shortness of breath, cough or sputum production.   Cardiovascular: Denies chest pain, chest tightness, palpitations or swelling in the hands or feet.  Gastrointestinal: Denies abdominal pain, bloating, constipation, diarrhea or blood in the stool.  Skin: Denies redness, rashes, lesions or ulcercations.  Neurological: Pt reports difficulty with memory. Denies dizziness, difficulty with speech or problems with balance and coordination.   No other specific complaints in a complete review of systems (except as listed in HPI above).  Objective:   Physical Exam BP 130/68  Pulse 69  Temp(Src) 97.5 F (36.4 C) (Oral)  Wt 196 lb 8 oz (89.132 kg)  SpO2 98%  LMP 01/19/1996 Wt Readings from Last 3 Encounters:  08/13/13 196 lb 8 oz (89.132 kg)  06/22/13 195 lb (88.451 kg)  06/08/13 196 lb (88.905 kg)    General: Appears her stated age, well developed, well nourished in NAD. Skin: Warm, dry and intact. No rashes, lesions or ulcerations noted. Cardiovascular: Normal rate and rhythm. S1,S2 noted.  No murmur, rubs or gallops noted. No JVD or BLE edema. No carotid bruits noted. Pulmonary/Chest: Normal effort and positive vesicular breath sounds. No respiratory distress. No wheezes, rales or ronchi noted.  Abdomen: Soft and nontender. Normal bowel sounds, no bruits noted. No distention or masses noted. Liver, spleen and kidneys non palpable. Neurological: Alert and oriented.Decreased short term memory. Cranial nerves II grossly intact. Coordination normal. +DTRs bilaterally.   BMET    Component Value Date/Time   NA 139 10/31/2012 1139   K 4.3 10/31/2012 1139   CL 102 10/31/2012 1139   CO2 28 10/31/2012 1139   GLUCOSE 169* 10/31/2012 1139   BUN 11 10/31/2012 1139   CREATININE 0.7 10/31/2012 1139   CALCIUM 9.7 10/31/2012 1139   GFRNONAA 94.08 10/06/2009 0831    GFRAA 107 12/08/2007 1052    Lipid Panel     Component Value Date/Time   CHOL 143 12/13/2011 0923   TRIG 92.0 12/13/2011 0923   HDL 56.10 12/13/2011 0923   CHOLHDL 3 12/13/2011 0923   VLDL 18.4 12/13/2011 0923   LDLCALC 69 12/13/2011  1610    CBC    Component Value Date/Time   WBC 10.1 12/08/2007 1052   RBC 4.07 12/08/2007 1052   HGB 12.9 12/08/2007 1052   HCT 37.4 12/08/2007 1052   PLT 261 12/08/2007 1052   MCV 92.0 12/08/2007 1052   MCHC 34.6 12/08/2007 1052   RDW 13.3 12/08/2007 1052   MONOABS 0.5 12/08/2007 1052   EOSABS 0.0 12/08/2007 1052   BASOSABS 0.0 12/08/2007 1052    Hgb A1C Lab Results  Component Value Date   HGBA1C 9.2* 03/02/2013          Assessment & Plan:

## 2013-08-13 NOTE — Assessment & Plan Note (Addendum)
Well controlled on Ramipril Will continue

## 2013-08-13 NOTE — Patient Instructions (Addendum)

## 2013-08-13 NOTE — Assessment & Plan Note (Signed)
Will recheck lipid profile today 

## 2013-08-13 NOTE — Progress Notes (Signed)
Pre visit review using our clinic review tool, if applicable. No additional management support is needed unless otherwise documented below in the visit note. 

## 2013-08-13 NOTE — Assessment & Plan Note (Signed)
Weight stable since last visit Encouraged her to work on diet and exercise

## 2013-08-13 NOTE — Assessment & Plan Note (Addendum)
Blood sugars range 97-322 but mostly under 150 On Metformin, Glipizide, Lantus and Tradjenta Will recheck A1C and microalbumin today Information give on basic carb counting

## 2013-08-14 NOTE — Addendum Note (Signed)
Addended by: Roena MaladyEVONTENNO, Cowen Pesqueira Y on: 08/14/2013 08:10 AM   Modules accepted: Orders

## 2013-08-20 ENCOUNTER — Other Ambulatory Visit: Payer: Self-pay | Admitting: Internal Medicine

## 2013-08-21 ENCOUNTER — Other Ambulatory Visit: Payer: Self-pay | Admitting: Internal Medicine

## 2013-08-27 LAB — HM DIABETES EYE EXAM

## 2013-09-05 ENCOUNTER — Other Ambulatory Visit: Payer: Self-pay

## 2013-09-05 MED ORDER — FLUTICASONE-SALMETEROL 100-50 MCG/DOSE IN AEPB: 1.0000 | INHALATION_SPRAY | Freq: Two times a day (BID) | RESPIRATORY_TRACT | Status: DC

## 2013-09-12 ENCOUNTER — Encounter: Payer: Self-pay | Admitting: Internal Medicine

## 2013-09-22 ENCOUNTER — Other Ambulatory Visit: Payer: Self-pay | Admitting: Internal Medicine

## 2013-10-08 ENCOUNTER — Other Ambulatory Visit: Payer: Medicare Other

## 2013-10-10 ENCOUNTER — Telehealth: Payer: Self-pay

## 2013-10-10 NOTE — Telephone Encounter (Signed)
Katie from [facility] called; pt requested pneumonia vaccine; immunization record has prevnar 13 given 03/02/13. Katie request pt notified if does not need pneumonia vaccine (pneumovax until after 03/02/2014.Please advise. Marland Kitchen

## 2013-10-11 NOTE — Telephone Encounter (Signed)
Can not have pneumovax until 02/2014

## 2013-10-11 NOTE — Telephone Encounter (Signed)
Spoke to pt's husband and he is aware as instructed

## 2013-10-15 ENCOUNTER — Encounter: Payer: Medicare Other | Admitting: Internal Medicine

## 2013-10-18 ENCOUNTER — Other Ambulatory Visit: Payer: Self-pay | Admitting: Internal Medicine

## 2013-11-06 ENCOUNTER — Other Ambulatory Visit: Payer: Self-pay

## 2013-11-06 MED ORDER — INSULIN GLARGINE 100 UNIT/ML SOLOSTAR PEN: PEN_INJECTOR | SUBCUTANEOUS | Status: DC

## 2013-11-06 NOTE — Telephone Encounter (Signed)
Mr Denise [name] request refill lantus solostar with new instructions so ins will pay for refill prior to end of month.[facility]. Mr Denise [name] notified done.

## 2013-11-19 ENCOUNTER — Encounter: Payer: Self-pay | Admitting: Internal Medicine

## 2013-12-24 ENCOUNTER — Encounter: Payer: Self-pay | Admitting: Internal Medicine

## 2013-12-24 ENCOUNTER — Ambulatory Visit (INDEPENDENT_AMBULATORY_CARE_PROVIDER_SITE_OTHER): Payer: Medicare Other | Admitting: Internal Medicine

## 2013-12-24 VITALS — BP 126/78 | HR 72 | Temp 98.0°F | Wt 200.0 lb

## 2013-12-24 DIAGNOSIS — I10 Essential (primary) hypertension: Secondary | ICD-10-CM

## 2013-12-24 DIAGNOSIS — M810 Age-related osteoporosis without current pathological fracture: Secondary | ICD-10-CM

## 2013-12-24 DIAGNOSIS — J4520 Mild intermittent asthma, uncomplicated: Secondary | ICD-10-CM

## 2013-12-24 DIAGNOSIS — M1990 Unspecified osteoarthritis, unspecified site: Secondary | ICD-10-CM

## 2013-12-24 DIAGNOSIS — E669 Obesity, unspecified: Secondary | ICD-10-CM

## 2013-12-24 DIAGNOSIS — E785 Hyperlipidemia, unspecified: Secondary | ICD-10-CM

## 2013-12-24 DIAGNOSIS — E1165 Type 2 diabetes mellitus with hyperglycemia: Secondary | ICD-10-CM

## 2013-12-24 DIAGNOSIS — K219 Gastro-esophageal reflux disease without esophagitis: Secondary | ICD-10-CM

## 2013-12-24 DIAGNOSIS — F0390 Unspecified dementia without behavioral disturbance: Secondary | ICD-10-CM

## 2013-12-24 LAB — LIPID PANEL
Cholesterol: 160 mg/dL (ref 0–200)
HDL: 72.5 mg/dL (ref >39.00)
LDL Cholesterol: 56 mg/dL (ref 0–99)
NONHDL: 87.5
Total CHOL/HDL Ratio: 2
Triglycerides: 158 mg/dL — ABNORMAL HIGH (ref 0.0–149.0)
VLDL: 31.6 mg/dL (ref 0.0–40.0)

## 2013-12-24 LAB — CBC
HEMATOCRIT: 44 % (ref 36.0–46.0)
HEMOGLOBIN: 14.4 g/dL (ref 12.0–15.0)
MCHC: 32.7 g/dL (ref 30.0–36.0)
MCV: 94.3 fl (ref 78.0–100.0)
PLATELETS: 269 10*3/uL (ref 150.0–400.0)
RBC: 4.67 Mil/uL (ref 3.87–5.11)
RDW: 14.1 % (ref 11.5–15.5)
WBC: 12.9 10*3/uL — AB (ref 4.0–10.5)

## 2013-12-24 LAB — COMPREHENSIVE METABOLIC PANEL
ALT: 29 U/L (ref 0–35)
AST: 21 U/L (ref 0–37)
Albumin: 3.8 g/dL (ref 3.5–5.2)
Alkaline Phosphatase: 69 U/L (ref 39–117)
BUN: 20 mg/dL (ref 6–23)
CALCIUM: 9.7 mg/dL (ref 8.4–10.5)
CHLORIDE: 101 meq/L (ref 96–112)
CO2: 29 mEq/L (ref 19–32)
CREATININE: 0.8 mg/dL (ref 0.4–1.2)
GFR: 80.22 mL/min (ref >60.00)
GLUCOSE: 326 mg/dL — AB (ref 70–99)
Potassium: 4.5 mEq/L (ref 3.5–5.1)
Sodium: 138 mEq/L (ref 135–145)
Total Bilirubin: 0.9 mg/dL (ref 0.2–1.2)
Total Protein: 6.9 g/dL (ref 6.0–8.3)

## 2013-12-24 LAB — HEMOGLOBIN A1C: HEMOGLOBIN A1C: 9.8 % — AB (ref 4.6–6.5)

## 2013-12-24 NOTE — Assessment & Plan Note (Signed)
Well controlled on Ramipril She was doing well off the medication but her husband does not want her to stop it Will check CBC and CMET today

## 2013-12-24 NOTE — Progress Notes (Signed)
Pre visit review using our clinic review tool, if applicable. No additional management support is needed unless otherwise documented below in the visit note. 

## 2013-12-24 NOTE — Progress Notes (Signed)
Subjective:    Patient ID: Denise [name], female    DOB: [birth year], 73 y.o.   MRN: [id number]  HPI  Pt presents to the clinic today for 6 month follow up of chronic conditions.  HLD: cholesterol was slightly elevated 07/2013, LDL 119. She is not on a statin at this time. She has been working on her diet, avoiding fried and fatty foods. She is taking fish oil daily.  HTN: BP well controlled off Ramipril. We had stopped it at her last visit, but her husband restarted it because he thought her blood pressure was too high.   Asthma/COPD: Denies breathing issues. Rare albuterol use.  GERD: No recent issues with reflux. She is not medicated.  OA: Knees and back mainly. She does take tylenol as needed for pain.  DM2: Sugars range mid 90's- 240. Last A1C reviewed- her Lantus was increased. She is taking the Metformin, Glyburide and Tradjenta as prescribed. She denies numbness or tingling in her hands or feet. Last eye exam 09/2013. Pneumonia shot UTD.  Obesity: She has gained 3.5 lbs since her last visit. She has not had a change in her diet. She does not exercise.  Dementia: Mild but stable on current dose of Namenda and Aricept. Husband had made a board that keeps her reminded what day it is and all activities for the day.  Osteoporosis: No issues on Fosamax. Last bone density 2012.  Review of Systems      Past Medical History  Diagnosis Date  . Asthma   . COPD (chronic obstructive pulmonary disease)   . GERD (gastroesophageal reflux disease)   . Arthritis   . Diabetes mellitus   . Hyperlipidemia   . Hypertension   . Hx of atrial fibrillation, no current medication   . Osteopenia   . Asthma   . Cataracts, bilateral 12/2010  . Plantar fasciitis 2010  . Carpal tunnel syndrome 09/2006    bilateral     Current Outpatient Prescriptions  Medication Sig Dispense Refill  . ACCU-CHEK FASTCLIX LANCETS MISC 1 each by Does not apply route daily. 102 each 3  . ACCU-CHEK FASTCLIX  LANCETS MISC 1 each by Does not apply route daily. 102 each 6  . ACCU-CHEK SMARTVIEW test strip USE AS INSTRUCTED ONCE DAILY 100 each 6  . albuterol (PROVENTIL HFA;VENTOLIN HFA) 108 (90 BASE) MCG/ACT inhaler Inhale 2 puffs into the lungs every 6 (six) hours as needed. 4 Inhaler 3  . alendronate (FOSAMAX) 70 MG tablet TAKE ONE TABLET BY MOUTH ONCE A WEEK 12 tablet 0  . aspirin 325 MG tablet Take 325 mg by mouth daily.      . BD PEN NEEDLE NANO U/F 32G X 4 MM MISC USE DAILY AS DIRECTED 100 each 6  . Calcium Carbonate-Vitamin D (CALTRATE 600+D PO) Take by mouth 2 (two) times daily.      Marland Kitchen donepezil (ARICEPT) 23 MG TABS tablet Take 1 tablet (23 mg total) by mouth at bedtime. 90 tablet 1  . fish oil-omega-3 fatty acids 1000 MG capsule Take 2 g by mouth daily.      . Fluticasone-Salmeterol (ADVAIR DISKUS) 100-50 MCG/DOSE AEPB Inhale 1 puff into the lungs 2 (two) times daily. 180 each 0  . glyBURIDE (DIABETA) 5 MG tablet TAKE ONE TABLET BY MOUTH TWICE DAILY 30 tablet 5  . Insulin Glargine (LANTUS SOLOSTAR) 100 UNIT/ML Solostar Pen INJECT 36 UNITS INTO THE SKIN AT BEDTIME 3 pen 6  . insulin glargine (LANTUS) 100 UNIT/ML injection Inject  36 Units into the skin at bedtime.    Marland Kitchen. linagliptin (TRADJENTA) 5 MG TABS tablet Take 1 tablet (5 mg total) by mouth daily. 30 tablet 3  . memantine (NAMENDA) 5 MG tablet Take 1 tablet (5 mg total) by mouth daily. 90 tablet 2  . metFORMIN (GLUCOPHAGE) 500 MG tablet TAKE 1 TABLET (500 MG TOTAL) BY MOUTH 2 (TWO) TIMES DAILY WITH A MEAL. 60 tablet 10  . multivitamin (THERAGRAN) per tablet Take 1 tablet by mouth daily.      . Potassium 99 MG TABS Take by mouth 2 (two) times daily.      . ramipril (ALTACE) 10 MG capsule TAKE 1 CAPSULE BY MOUTH ONCE A DAY 90 capsule 3  . ranitidine (ZANTAC) 150 MG tablet TAKE ONE TABLET BY MOUTH TWICE DAILY 180 tablet 3  . Vitamin D, Ergocalciferol, (DRISDOL) 50000 UNITS CAPS TAKE ONE CAPSULE BY MOUTH EVERY OTHER WEEK 6 capsule 2   No current  facility-administered medications for this visit.    Allergies  Allergen Reactions  . Codeine   . Sulfonamide Derivatives     Family History  Problem Relation Age of Onset  . Heart disease Mother   . Cancer Mother     ovarian  . Diabetes Neg Hx   . Stroke Neg Hx     History   Social History  . Marital Status: Married    Spouse Name: N/A    Number of Children: N/A  . Years of Education: N/A   Occupational History  . Not on file.   Social History Main Topics  . Smoking status: Never Smoker   . Smokeless tobacco: Not on file  . Alcohol Use: No  . Drug Use: No  . Sexual Activity:    Partners: Male    Birth Control/ Protection: Post-menopausal   Other Topics Concern  . Not on file   Social History Narrative     Constitutional: Denies fever, malaise, fatigue, headache or abrupt weight changes.  HEENT: Denies eye pain, eye redness, ear pain, ringing in the ears, wax buildup, runny nose, nasal congestion, bloody nose, or sore throat. Respiratory: Denies difficulty breathing, shortness of breath, cough or sputum production.   Cardiovascular: Denies chest pain, chest tightness, palpitations or swelling in the hands or feet.  Gastrointestinal: Denies abdominal pain, bloating, constipation, diarrhea or blood in the stool.  GU: Denies urgency, frequency, pain with urination, burning sensation, blood in urine, odor or discharge. Musculoskeletal: Pt reports bilateral knee pain. Denies decrease in range of motion, difficulty with gait, muscle pain or joint swelling.  Skin: Denies redness, rashes, lesions or ulcercations.  Neurological: Pt reports difficulty with memory. Denies dizziness, difficulty with speech or problems with balance and coordination.   No other specific complaints in a complete review of systems (except as listed in HPI above).  Objective:   Physical Exam   BP 126/78 mmHg  Pulse 72  Temp(Src) 98 F (36.7 C) (Oral)  Wt 200 lb (90.719 kg)  SpO2 97%   LMP 01/19/1996 Wt Readings from Last 3 Encounters:  12/24/13 200 lb (90.719 kg)  08/13/13 196 lb 8 oz (89.132 kg)  06/22/13 195 lb (88.451 kg)    General: Appears her stated age, obese but well developed, well nourished in NAD. Skin: Warm, dry and intact. Some cracking of the skin noted on the fingers and heels due to extremely dry skin.  HEENT: Head: normal shape and size; Ears: Tm's gray and intact, normal light reflex; Nose: mucosa pink  and moist, septum midline; Throat/Mouth: Teeth present, mucosa pink and moist, no exudate, lesions or ulcerations noted.  Cardiovascular: Normal rate and rhythm. S1,S2 noted. Murmur noted.  No rubs or gallops noted. No JVD or BLE edema. No carotid bruits noted. Pulmonary/Chest: Normal effort and positive vesicular breath sounds. No respiratory distress. No wheezes, rales or ronchi noted.  Abdomen: Soft and nontender. Normal bowel sounds, no bruits noted. No distention or masses noted. Liver, spleen and kidneys non palpable. Neurological: Alert and oriented. Sensation intact bilaterally Coordination normal.  Psychiatric: Mood and affect normal. Behavior is normal. Judgment and thought content normal.     BMET    Component Value Date/Time   NA 139 10/31/2012 1139   K 4.3 10/31/2012 1139   CL 102 10/31/2012 1139   CO2 28 10/31/2012 1139   GLUCOSE 169* 10/31/2012 1139   BUN 11 10/31/2012 1139   CREATININE 0.7 10/31/2012 1139   CALCIUM 9.7 10/31/2012 1139   GFRNONAA 94.08 10/06/2009 0831   GFRAA 107 12/08/2007 1052    Lipid Panel     Component Value Date/Time   CHOL 214* 08/13/2013 0847   TRIG 157.0* 08/13/2013 0847   HDL 63.50 08/13/2013 0847   CHOLHDL 3 08/13/2013 0847   VLDL 31.4 08/13/2013 0847   LDLCALC 119* 08/13/2013 0847    CBC    Component Value Date/Time   WBC 10.1 12/08/2007 1052   RBC 4.07 12/08/2007 1052   HGB 12.9 12/08/2007 1052   HCT 37.4 12/08/2007 1052   PLT 261 12/08/2007 1052   MCV 92.0 12/08/2007 1052   MCHC 34.6  12/08/2007 1052   RDW 13.3 12/08/2007 1052   MONOABS 0.5 12/08/2007 1052   EOSABS 0.0 12/08/2007 1052   BASOSABS 0.0 12/08/2007 1052    Hgb A1C Lab Results  Component Value Date   HGBA1C 8.9* 08/13/2013        Assessment & Plan:

## 2013-12-24 NOTE — Assessment & Plan Note (Signed)
Not currently an issue Not medicated Advised her to take Tums OTC if needed

## 2013-12-24 NOTE — Assessment & Plan Note (Signed)
Will recheck A1C today Continue Metformin, Glyburide and Tradjenta Will adjust Lantus if needed Continue checking your sugars Diet information given on low carb diet Eye exam UTD Foot exam today Pneumonia vaccine UTD Forgot to ask her about flu vaccine- will address at next visit

## 2013-12-24 NOTE — Assessment & Plan Note (Signed)
No issues on fosamax Will order repeat bone density at next visit

## 2013-12-24 NOTE — Assessment & Plan Note (Signed)
Slightly elevated LDL 119 Will recheck today May need to start statin therapy

## 2013-12-24 NOTE — Assessment & Plan Note (Signed)
Encouraged her to work on diet and exercise 

## 2013-12-24 NOTE — Assessment & Plan Note (Signed)
Occasional tylenol use

## 2013-12-24 NOTE — Assessment & Plan Note (Signed)
I question wether she truly has asthma Rara albuterol use

## 2013-12-24 NOTE — Patient Instructions (Signed)
Diabetes Mellitus and Food It is important for you to manage your blood sugar (glucose) level. Your blood glucose level can be greatly affected by what you eat. Eating healthier foods in the appropriate amounts throughout the day at about the same time each day will help you control your blood glucose level. It can also help slow or prevent worsening of your diabetes mellitus. Healthy eating may even help you improve the level of your blood pressure and reach or maintain a healthy weight.  HOW CAN FOOD AFFECT ME? Carbohydrates Carbohydrates affect your blood glucose level more than any other type of food. Your dietitian will help you determine how many carbohydrates to eat at each meal and teach you how to count carbohydrates. Counting carbohydrates is important to keep your blood glucose at a healthy level, especially if you are using insulin or taking certain medicines for diabetes mellitus. Alcohol Alcohol can cause sudden decreases in blood glucose (hypoglycemia), especially if you use insulin or take certain medicines for diabetes mellitus. Hypoglycemia can be a life-threatening condition. Symptoms of hypoglycemia (sleepiness, dizziness, and disorientation) are similar to symptoms of having too much alcohol.  If your health care provider has given you approval to drink alcohol, do so in moderation and use the following guidelines:  Women should not have more than one drink per day, and men should not have more than two drinks per day. One drink is equal to:  12 oz of beer.  5 oz of wine.  1 oz of hard liquor.  Do not drink on an empty stomach.  Keep yourself hydrated. Have water, diet soda, or unsweetened iced tea.  Regular soda, juice, and other mixers might contain a lot of carbohydrates and should be counted. WHAT FOODS ARE NOT RECOMMENDED? As you make food choices, it is important to remember that all foods are not the same. Some foods have fewer nutrients per serving than other  foods, even though they might have the same number of calories or carbohydrates. It is difficult to get your body what it needs when you eat foods with fewer nutrients. Examples of foods that you should avoid that are high in calories and carbohydrates but low in nutrients include:  Trans fats (most processed foods list trans fats on the Nutrition Facts label).  Regular soda.  Juice.  Candy.  Sweets, such as cake, pie, doughnuts, and cookies.  Fried foods. WHAT FOODS CAN I EAT? Have nutrient-rich foods, which will nourish your body and keep you healthy. The food you should eat also will depend on several factors, including:  The calories you need.  The medicines you take.  Your weight.  Your blood glucose level.  Your blood pressure level.  Your cholesterol level. You also should eat a variety of foods, including:  Protein, such as meat, poultry, fish, tofu, nuts, and seeds (lean animal proteins are best).  Fruits.  Vegetables.  Dairy products, such as milk, cheese, and yogurt (low fat is best).  Breads, grains, pasta, cereal, rice, and beans.  Fats such as olive oil, trans fat-free margarine, canola oil, avocado, and olives. DOES EVERYONE WITH DIABETES MELLITUS HAVE THE SAME MEAL PLAN? Because every person with diabetes mellitus is different, there is not one meal plan that works for everyone. It is very important that you meet with a dietitian who will help you create a meal plan that is just right for you. Document Released: 10/01/2004 Document Revised: 01/09/2013 Document Reviewed: 12/01/2012 [employer] Patient Information 2015 [employer]. This   information is not intended to replace advice given to you by your health care provider. Make sure you discuss any questions you have with your health care provider.  

## 2013-12-24 NOTE — Assessment & Plan Note (Signed)
Stable on current dose of Aricept and Namenda Husband has made a reminder chart to keep her oriented- this seems to be working well

## 2013-12-26 NOTE — Addendum Note (Signed)
Addended by: Roena MaladyEVONTENNO, Dayvon Dax Y on: 12/26/2013 04:46 PM   Modules accepted: Orders, Medications

## 2014-01-02 ENCOUNTER — Telehealth: Payer: Self-pay

## 2014-01-02 MED ORDER — METFORMIN HCL 500 MG PO TABS: ORAL_TABLET | ORAL | Status: DC

## 2014-01-02 NOTE — Telephone Encounter (Signed)
Her LDL is at goal without the medication. Why put her on a medication she doesn't need?

## 2014-01-02 NOTE — Telephone Encounter (Signed)
Spoke to Mr Dwain Sarnaicke and he is aware that pt should not be taking Atorvastatin as her levels are much better and in normal range so need to take medication at this time

## 2014-01-02 NOTE — Telephone Encounter (Signed)
Mr Denise [name] request refill metformin to [facility] [address]. Advised done. Mr Denise [name] also request refill on atorvastatin 20 mg. In 12/24/13 office note pt was not on statin at this time. Lab result note did not restart statin. Mr Denise [name] said pt was off statin for brief time but has been taking atorvastatin 20 mg daily. Mr Denise [name] could not tell me how long pt had been back on med but request atorvastatin to [facility].Please advise. Mr Denise [name] request cb.

## 2014-01-16 ENCOUNTER — Other Ambulatory Visit: Payer: Self-pay | Admitting: *Deleted

## 2014-01-16 ENCOUNTER — Telehealth: Payer: Self-pay | Admitting: *Deleted

## 2014-01-16 NOTE — Telephone Encounter (Signed)
She should not be taking lipitor. See phone note from 12/16 or 12/17

## 2014-01-16 NOTE — Telephone Encounter (Signed)
Received faxed request asking for Atorvastatin, which is not on her med list was taking off 06/2013, please advise

## 2014-01-17 NOTE — Telephone Encounter (Signed)
Spoke to pt's husband and he is aware from previous phone note--this maybe an automatic refill

## 2014-01-23 ENCOUNTER — Other Ambulatory Visit: Payer: Self-pay | Admitting: Internal Medicine

## 2014-01-24 ENCOUNTER — Encounter: Payer: Self-pay | Admitting: Internal Medicine

## 2014-01-24 ENCOUNTER — Ambulatory Visit (INDEPENDENT_AMBULATORY_CARE_PROVIDER_SITE_OTHER): Payer: Medicare Other | Admitting: Internal Medicine

## 2014-01-24 VITALS — BP 132/78 | HR 56 | Temp 97.5°F | Wt 199.0 lb

## 2014-01-24 DIAGNOSIS — L03114 Cellulitis of left upper limb: Secondary | ICD-10-CM

## 2014-01-24 DIAGNOSIS — N907 Vulvar cyst: Secondary | ICD-10-CM

## 2014-01-24 DIAGNOSIS — L723 Sebaceous cyst: Secondary | ICD-10-CM

## 2014-01-24 MED ORDER — AMOXICILLIN-POT CLAVULANATE 875-125 MG PO TABS: 1.0000 | ORAL_TABLET | Freq: Two times a day (BID) | ORAL | Status: DC

## 2014-01-24 NOTE — Patient Instructions (Signed)

## 2014-01-24 NOTE — Progress Notes (Signed)
Subjective:    Patient ID: Denise [name], female    DOB: 02-25-1940, 74 y.o.   MRN: [id number]  HPI  Pt presents to the clinic today with c/o redness, swelling and pain to left lower arm. She noticed this 3 days ago. She has noticed that the area is warm to touch and very sore. She has not been bit or scratched her arm that she is aware of. She denies fever, chills or body aches. She has not tried anything OTC.  Additionally, she c/o a small bump on the inside of her labia. She noticed this 1 week ago. It is not painful. She denies vaginal discharge or odor. She denies urinary symptoms. She has not tried anything OTC.  Review of Systems      Past Medical History  Diagnosis Date  . Asthma   . COPD (chronic obstructive pulmonary disease)   . GERD (gastroesophageal reflux disease)   . Arthritis   . Diabetes mellitus   . Hyperlipidemia   . Hypertension   . Hx of atrial fibrillation, no current medication   . Osteopenia   . Asthma   . Cataracts, bilateral 12/2010  . Plantar fasciitis 2010  . Carpal tunnel syndrome 09/2006    bilateral     Current Outpatient Prescriptions  Medication Sig Dispense Refill  . ACCU-CHEK SMARTVIEW test strip     . albuterol (PROVENTIL HFA;VENTOLIN HFA) 108 (90 BASE) MCG/ACT inhaler Inhale 2 puffs into the lungs every 6 (six) hours as needed. 4 Inhaler 3  . alendronate (FOSAMAX) 70 MG tablet TAKE ONE TABLET BY MOUTH ONCE A WEEK 12 tablet 0  . aspirin 325 MG tablet Take 325 mg by mouth daily.      . BD PEN NEEDLE NANO U/F 32G X 4 MM MISC USE DAILY AS DIRECTED 100 each 6  . Blood Glucose Monitoring Suppl (ACCU-CHEK NANO SMARTVIEW) W/DEVICE KIT by Does not apply route.    . Calcium Carbonate-Vitamin D (CALTRATE 600+D PO) Take by mouth 2 (two) times daily.      Marland Kitchen donepezil (ARICEPT) 23 MG TABS tablet Take 1 tablet (23 mg total) by mouth at bedtime. 90 tablet 1  . fish oil-omega-3 fatty acids 1000 MG capsule Take 2 g by mouth daily.      Marland Kitchen glyBURIDE  (DIABETA) 5 MG tablet TAKE ONE TABLET BY MOUTH TWICE DAILY 30 tablet 5  . Insulin Glargine (LANTUS SOLOSTAR) 100 UNIT/ML Solostar Pen Inject 40 Units into the skin daily at 10 pm.    . memantine (NAMENDA) 5 MG tablet Take 1 tablet (5 mg total) by mouth daily. 90 tablet 2  . metFORMIN (GLUCOPHAGE) 500 MG tablet TAKE 1 TABLET (500 MG TOTAL) BY MOUTH 2 (TWO) TIMES DAILY WITH A MEAL. 60 tablet 5  . multivitamin (THERAGRAN) per tablet Take 1 tablet by mouth daily.      . Potassium 99 MG TABS Take by mouth 2 (two) times daily.      . ramipril (ALTACE) 10 MG capsule TAKE 1 CAPSULE BY MOUTH ONCE A DAY 90 capsule 3  . TRADJENTA 5 MG TABS tablet TAKE 1 TABLET BY MOUTH ONCE DAILY 90 tablet 1  . Vitamin D, Ergocalciferol, (DRISDOL) 50000 UNITS CAPS TAKE ONE CAPSULE BY MOUTH EVERY OTHER WEEK 6 capsule 2   No current facility-administered medications for this visit.    Allergies  Allergen Reactions  . Codeine   . Sulfonamide Derivatives     Family History  Problem Relation Age of Onset  .  Heart disease Mother   . Cancer Mother     ovarian  . Diabetes Neg Hx   . Stroke Neg Hx     History   Social History  . Marital Status: Married    Spouse Name: N/A    Number of Children: N/A  . Years of Education: N/A   Occupational History  . Not on file.   Social History Main Topics  . Smoking status: Never Smoker   . Smokeless tobacco: Not on file  . Alcohol Use: No  . Drug Use: No  . Sexual Activity:    Partners: Male    Birth Control/ Protection: Post-menopausal   Other Topics Concern  . Not on file   Social History Narrative     Constitutional: Denies fever, malaise, fatigue, headache or abrupt weight changes.  Respiratory: Denies difficulty breathing, shortness of breath, cough or sputum production.   Cardiovascular: Denies chest pain, chest tightness, palpitations or swelling in the hands or feet.  GU: Pt reports bump of labia. Denies urgency, frequency, pain with urination,  burning sensation, blood in urine, odor or discharge. Skin: Pt reports redness, warmth and tenderness or left forearm. Denies rashes, lesions or ulcercations.   No other specific complaints in a complete review of systems (except as listed in HPI above).  Objective:   Physical Exam  BP 132/78 mmHg  Pulse 56  Temp(Src) 97.5 F (36.4 C) (Oral)  Wt 199 lb (90.266 kg)  SpO2 97%  LMP 01/19/1996 Wt Readings from Last 3 Encounters:  01/24/14 199 lb (90.266 kg)  12/24/13 200 lb (90.719 kg)  08/13/13 196 lb 8 oz (89.132 kg)    General: Appears her stated age, obese but well developed, well nourished in NAD. Skin: Warm, dry and intact. Cellulitis noted of left posterior forearm, no streaking noted. Cardiovascular: Normal rate and rhythm. S1,S2 noted.  No murmur, rubs or gallops noted. . Pulmonary/Chest: Normal effort and positive vesicular breath sounds. No respiratory distress. No wheezes, rales or ronchi noted.  Abdomen: Soft and nontender. Normal bowel sounds, no bruits noted. No distention or masses noted. Liver, spleen and kidneys non palpable. Pelvic: Normal female anatomy. Sebaceous cyst noted of left labia majora, not infected.  BMET    Component Value Date/Time   NA 138 12/24/2013 1054   K 4.5 12/24/2013 1054   CL 101 12/24/2013 1054   CO2 29 12/24/2013 1054   GLUCOSE 326* 12/24/2013 1054   BUN 20 12/24/2013 1054   CREATININE 0.8 12/24/2013 1054   CALCIUM 9.7 12/24/2013 1054   GFRNONAA 94.08 10/06/2009 0831   GFRAA 107 12/08/2007 1052    Lipid Panel     Component Value Date/Time   CHOL 160 12/24/2013 1054   TRIG 158.0* 12/24/2013 1054   HDL 72.50 12/24/2013 1054   CHOLHDL 2 12/24/2013 1054   VLDL 31.6 12/24/2013 1054   LDLCALC 56 12/24/2013 1054    CBC    Component Value Date/Time   WBC 12.9* 12/24/2013 1054   RBC 4.67 12/24/2013 1054   HGB 14.4 12/24/2013 1054   HCT 44.0 12/24/2013 1054   PLT 269.0 12/24/2013 1054   MCV 94.3 12/24/2013 1054   MCHC 32.7  12/24/2013 1054   RDW 14.1 12/24/2013 1054   MONOABS 0.5 12/08/2007 1052   EOSABS 0.0 12/08/2007 1052   BASOSABS 0.0 12/08/2007 1052    Hgb A1C Lab Results  Component Value Date   HGBA1C 9.8* 12/24/2013         Assessment & Plan:  Cellulitis of left forearm:  Keep the arm elevated to help with the swelling An ice pack may also be helpful eRx for Augmentin BID x 10 days Watch for fever, increased redness, pain or streaking  Sebaceous cyst of labia:  Not infected Will monitor for now  RTC as needed or if symptoms persist or worsen

## 2014-01-24 NOTE — Progress Notes (Signed)
Pre visit review using our clinic review tool, if applicable. No additional management support is needed unless otherwise documented below in the visit note. 

## 2014-01-30 ENCOUNTER — Telehealth: Payer: Self-pay

## 2014-01-30 NOTE — Telephone Encounter (Signed)
[name]/14 for bi-annual visit and request last A1C; on 12/24/13 A1C was 9.8. Mr Denise [name] voice understanding.

## 2014-01-31 NOTE — Telephone Encounter (Signed)
I am aware  

## 2014-01-31 NOTE — Telephone Encounter (Signed)
Denise [name] with [facility] left v/m leaving informational message;Denise [name] is doing routine wellness visit and pts A1C is 9.1; if A1C is above 7 Denise [name] has to call results; blood sugars are averaging 100-150. Pt appears to be doing well per Denise [name]. If you need pt to know anything different from what pt is already doing please contact pt. Denise [name] does not require cb but if provider needs to speak with Denise [name] call [phone].

## 2014-03-03 ENCOUNTER — Inpatient Hospital Stay (HOSPITAL_COMMUNITY): Payer: Medicare Other

## 2014-03-03 ENCOUNTER — Inpatient Hospital Stay (HOSPITAL_COMMUNITY)
Admission: EM | Admit: 2014-03-03 | Discharge: 2014-03-13 | DRG: 964 | Disposition: A | Discharge status: Rehabilitation Facility | Payer: Medicare Other | Attending: General Surgery | Admitting: General Surgery

## 2014-03-03 ENCOUNTER — Encounter (HOSPITAL_COMMUNITY): Payer: Self-pay

## 2014-03-03 ENCOUNTER — Emergency Department (HOSPITAL_COMMUNITY): Payer: Medicare Other

## 2014-03-03 DIAGNOSIS — R52 Pain, unspecified: Secondary | ICD-10-CM | POA: Diagnosis not present

## 2014-03-03 DIAGNOSIS — E119 Type 2 diabetes mellitus without complications: Secondary | ICD-10-CM | POA: Diagnosis present

## 2014-03-03 DIAGNOSIS — S32591A Other specified fracture of right pubis, initial encounter for closed fracture: Secondary | ICD-10-CM | POA: Diagnosis present

## 2014-03-03 DIAGNOSIS — Z794 Long term (current) use of insulin: Secondary | ICD-10-CM | POA: Diagnosis not present

## 2014-03-03 DIAGNOSIS — J449 Chronic obstructive pulmonary disease, unspecified: Secondary | ICD-10-CM | POA: Diagnosis present

## 2014-03-03 DIAGNOSIS — S270XXA Traumatic pneumothorax, initial encounter: Secondary | ICD-10-CM | POA: Diagnosis present

## 2014-03-03 DIAGNOSIS — Z7982 Long term (current) use of aspirin: Secondary | ICD-10-CM | POA: Diagnosis not present

## 2014-03-03 DIAGNOSIS — J45909 Unspecified asthma, uncomplicated: Secondary | ICD-10-CM | POA: Diagnosis present

## 2014-03-03 DIAGNOSIS — I4891 Unspecified atrial fibrillation: Secondary | ICD-10-CM | POA: Diagnosis present

## 2014-03-03 DIAGNOSIS — E785 Hyperlipidemia, unspecified: Secondary | ICD-10-CM | POA: Diagnosis present

## 2014-03-03 DIAGNOSIS — S2241XA Multiple fractures of ribs, right side, initial encounter for closed fracture: Secondary | ICD-10-CM | POA: Diagnosis present

## 2014-03-03 DIAGNOSIS — K219 Gastro-esophageal reflux disease without esophagitis: Secondary | ICD-10-CM | POA: Diagnosis present

## 2014-03-03 DIAGNOSIS — S42001A Fracture of unspecified part of right clavicle, initial encounter for closed fracture: Principal | ICD-10-CM | POA: Diagnosis present

## 2014-03-03 DIAGNOSIS — R338 Other retention of urine: Secondary | ICD-10-CM | POA: Diagnosis not present

## 2014-03-03 DIAGNOSIS — R339 Retention of urine, unspecified: Secondary | ICD-10-CM | POA: Diagnosis not present

## 2014-03-03 DIAGNOSIS — S3210XA Unspecified fracture of sacrum, initial encounter for closed fracture: Secondary | ICD-10-CM | POA: Diagnosis present

## 2014-03-03 DIAGNOSIS — J939 Pneumothorax, unspecified: Secondary | ICD-10-CM

## 2014-03-03 DIAGNOSIS — S27329A Contusion of lung, unspecified, initial encounter: Secondary | ICD-10-CM | POA: Diagnosis present

## 2014-03-03 DIAGNOSIS — F0390 Unspecified dementia without behavioral disturbance: Secondary | ICD-10-CM | POA: Diagnosis present

## 2014-03-03 DIAGNOSIS — S32511A Fracture of superior rim of right pubis, initial encounter for closed fracture: Secondary | ICD-10-CM | POA: Diagnosis present

## 2014-03-03 DIAGNOSIS — I10 Essential (primary) hypertension: Secondary | ICD-10-CM | POA: Diagnosis present

## 2014-03-03 DIAGNOSIS — S2249XA Multiple fractures of ribs, unspecified side, initial encounter for closed fracture: Secondary | ICD-10-CM

## 2014-03-03 LAB — COMPREHENSIVE METABOLIC PANEL
ALT: 57 U/L — ABNORMAL HIGH (ref 0–35)
ANION GAP: 6 (ref 5–15)
AST: 81 U/L — AB (ref 0–37)
Albumin: 3.7 g/dL (ref 3.5–5.2)
Alkaline Phosphatase: 68 U/L (ref 39–117)
BILIRUBIN TOTAL: 1 mg/dL (ref 0.3–1.2)
BUN: 11 mg/dL (ref 6–23)
CALCIUM: 9.4 mg/dL (ref 8.4–10.5)
CO2: 29 mmol/L (ref 19–32)
Chloride: 98 mmol/L (ref 96–112)
Creatinine, Ser: 0.74 mg/dL (ref 0.50–1.10)
GFR calc non Af Amer: 82 mL/min — ABNORMAL LOW (ref >90)
Glucose, Bld: 479 mg/dL — ABNORMAL HIGH (ref 70–99)
POTASSIUM: 5 mmol/L (ref 3.5–5.1)
Sodium: 133 mmol/L — ABNORMAL LOW (ref 135–145)
Total Protein: 6.9 g/dL (ref 6.0–8.3)

## 2014-03-03 LAB — URINALYSIS, ROUTINE W REFLEX MICROSCOPIC
Bilirubin Urine: NEGATIVE
Glucose, UA: >1000 mg/dL — AB
Ketones, ur: NEGATIVE mg/dL
Leukocytes, UA: NEGATIVE
Nitrite: NEGATIVE
PH: 6 (ref 5.0–8.0)
Protein, ur: NEGATIVE mg/dL
Specific Gravity, Urine: 1.037 — ABNORMAL HIGH (ref 1.005–1.030)
Urobilinogen, UA: 0.2 mg/dL (ref 0.0–1.0)

## 2014-03-03 LAB — CBC
HCT: 44.3 % (ref 36.0–46.0)
HEMOGLOBIN: 15 g/dL (ref 12.0–15.0)
MCH: 31.9 pg (ref 26.0–34.0)
MCHC: 33.9 g/dL (ref 30.0–36.0)
MCV: 94.3 fL (ref 78.0–100.0)
Platelets: 221 10*3/uL (ref 150–400)
RBC: 4.7 MIL/uL (ref 3.87–5.11)
RDW: 13 % (ref 11.5–15.5)
WBC: 16.5 10*3/uL — AB (ref 4.0–10.5)

## 2014-03-03 LAB — URINE MICROSCOPIC-ADD ON

## 2014-03-03 LAB — I-STAT CHEM 8, ED
BUN: 13 mg/dL (ref 6–23)
CALCIUM ION: 1.19 mmol/L (ref 1.13–1.30)
Chloride: 98 mmol/L (ref 96–112)
Creatinine, Ser: 0.7 mg/dL (ref 0.50–1.10)
GLUCOSE: 474 mg/dL — AB (ref 70–99)
HCT: 47 % — ABNORMAL HIGH (ref 36.0–46.0)
HEMOGLOBIN: 16 g/dL — AB (ref 12.0–15.0)
Potassium: 4.8 mmol/L (ref 3.5–5.1)
Sodium: 136 mmol/L (ref 135–145)
TCO2: 24 mmol/L (ref 0–100)

## 2014-03-03 LAB — PROTIME-INR
INR: 0.97 (ref 0.00–1.49)
Prothrombin Time: 13 seconds (ref 11.6–15.2)

## 2014-03-03 LAB — SAMPLE TO BLOOD BANK

## 2014-03-03 LAB — ETHANOL

## 2014-03-03 MED ORDER — SODIUM CHLORIDE 0.9 % IV BOLUS (SEPSIS)
100.0000 mL | Freq: Once | INTRAVENOUS | Status: AC
  Administered 2014-03-03: 100 mL via INTRAVENOUS

## 2014-03-03 MED ORDER — INSULIN ASPART 100 UNIT/ML ~~LOC~~ SOLN
10.0000 [IU] | Freq: Once | SUBCUTANEOUS | Status: AC
  Administered 2014-03-03: 10 [IU] via SUBCUTANEOUS
  Filled 2014-03-03: qty 1

## 2014-03-03 MED ORDER — FENTANYL CITRATE 0.05 MG/ML IJ SOLN
50.0000 ug | Freq: Once | INTRAMUSCULAR | Status: AC
  Administered 2014-03-03: 50 ug via INTRAVENOUS
  Filled 2014-03-03: qty 2
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
## 2014-03-03 NOTE — ED Notes (Signed)
Small circular skin tear to right elbow, about the size of a quarter. Scant bleeding.

## 2014-03-03 NOTE — ED Notes (Signed)
Patient to CT.

## 2014-03-04 ENCOUNTER — Inpatient Hospital Stay (HOSPITAL_COMMUNITY): Payer: Medicare Other

## 2014-03-04 LAB — BASIC METABOLIC PANEL
Anion gap: 8 (ref 5–15)
BUN: 11 mg/dL (ref 6–23)
CO2: 24 mmol/L (ref 19–32)
CREATININE: 0.66 mg/dL (ref 0.50–1.10)
Calcium: 8.8 mg/dL (ref 8.4–10.5)
Chloride: 104 mmol/L (ref 96–112)
GFR, EST NON AFRICAN AMERICAN: 85 mL/min — AB (ref >90)
Glucose, Bld: 298 mg/dL — ABNORMAL HIGH (ref 70–99)
Potassium: 4.2 mmol/L (ref 3.5–5.1)
Sodium: 136 mmol/L (ref 135–145)

## 2014-03-04 LAB — GLUCOSE, CAPILLARY
GLUCOSE-CAPILLARY: 274 mg/dL — AB (ref 70–99)
GLUCOSE-CAPILLARY: 170 mg/dL — AB (ref 70–99)
Glucose-Capillary: 273 mg/dL — ABNORMAL HIGH (ref 70–99)
Glucose-Capillary: 267 mg/dL — ABNORMAL HIGH (ref 70–99)
Glucose-Capillary: 154 mg/dL — ABNORMAL HIGH (ref 70–99)

## 2014-03-04 LAB — CBC
HCT: 43.6 % (ref 36.0–46.0)
HEMOGLOBIN: 14.7 g/dL (ref 12.0–15.0)
MCH: 31.7 pg (ref 26.0–34.0)
MCHC: 33.7 g/dL (ref 30.0–36.0)
MCV: 94.2 fL (ref 78.0–100.0)
Platelets: 205 10*3/uL (ref 150–400)
RBC: 4.63 MIL/uL (ref 3.87–5.11)
RDW: 13.1 % (ref 11.5–15.5)
WBC: 24 10*3/uL — ABNORMAL HIGH (ref 4.0–10.5)

## 2014-03-04 LAB — MRSA PCR SCREENING: MRSA by PCR: NEGATIVE

## 2014-03-04 LAB — CDS SEROLOGY

## 2014-03-04 MED ORDER — LINAGLIPTIN 5 MG PO TABS
5.0000 mg | ORAL_TABLET | Freq: Every day | ORAL | Status: DC
  Administered 2014-03-04 – 2014-03-13 (×10): 5 mg via ORAL
  Filled 2014-03-04 (×10): qty 1

## 2014-03-04 MED ORDER — DONEPEZIL HCL 23 MG PO TABS
23.0000 mg | ORAL_TABLET | Freq: Every day | ORAL | Status: DC
  Administered 2014-03-04 – 2014-03-12 (×9): 23 mg via ORAL
  Filled 2014-03-04 (×10): qty 1

## 2014-03-04 MED ORDER — HYDROMORPHONE HCL 1 MG/ML IJ SOLN
1.0000 mg | INTRAMUSCULAR | Status: DC | PRN
  Administered 2014-03-04 – 2014-03-08 (×16): 1 mg via INTRAVENOUS
  Filled 2014-03-04 (×16): qty 1

## 2014-03-04 MED ORDER — MEMANTINE HCL 5 MG PO TABS
5.0000 mg | ORAL_TABLET | Freq: Every day | ORAL | Status: DC
  Administered 2014-03-04 – 2014-03-13 (×10): 5 mg via ORAL
  Filled 2014-03-04 (×10): qty 1

## 2014-03-04 MED ORDER — RAMIPRIL 10 MG PO CAPS
10.0000 mg | ORAL_CAPSULE | Freq: Every day | ORAL | Status: DC
  Administered 2014-03-04 – 2014-03-13 (×10): 10 mg via ORAL
  Filled 2014-03-04 (×10): qty 1

## 2014-03-04 MED ORDER — METFORMIN HCL 500 MG PO TABS
500.0000 mg | ORAL_TABLET | Freq: Two times a day (BID) | ORAL | Status: DC
  Administered 2014-03-04 – 2014-03-13 (×20): 500 mg via ORAL
  Filled 2014-03-04 (×22): qty 1

## 2014-03-04 MED ORDER — OXYCODONE HCL 5 MG PO TABS
5.0000 mg | ORAL_TABLET | ORAL | Status: DC | PRN
  Administered 2014-03-04 – 2014-03-07 (×7): 5 mg via ORAL
  Filled 2014-03-04 (×7): qty 1

## 2014-03-04 MED ORDER — GLYBURIDE 5 MG PO TABS
5.0000 mg | ORAL_TABLET | Freq: Two times a day (BID) | ORAL | Status: DC
  Administered 2014-03-04 – 2014-03-13 (×18): 5 mg via ORAL
  Filled 2014-03-04 (×20): qty 1

## 2014-03-04 MED ORDER — INSULIN ASPART 100 UNIT/ML ~~LOC~~ SOLN
0.0000 [IU] | SUBCUTANEOUS | Status: DC
  Administered 2014-03-04 (×2): 11 [IU] via SUBCUTANEOUS

## 2014-03-04 MED ORDER — CETYLPYRIDINIUM CHLORIDE 0.05 % MT LIQD
7.0000 mL | Freq: Two times a day (BID) | OROMUCOSAL | Status: DC
  Administered 2014-03-04 – 2014-03-12 (×17): 7 mL via OROMUCOSAL

## 2014-03-04 MED ORDER — ASPIRIN 325 MG PO TABS
325.0000 mg | ORAL_TABLET | Freq: Every day | ORAL | Status: DC
  Administered 2014-03-04 – 2014-03-13 (×10): 325 mg via ORAL
  Filled 2014-03-04 (×11): qty 1

## 2014-03-04 MED ORDER — HYDROMORPHONE HCL 1 MG/ML IJ SOLN
1.0000 mg | INTRAMUSCULAR | Status: DC | PRN
  Administered 2014-03-04 (×2): 1 mg via INTRAVENOUS
  Filled 2014-03-04: qty 1

## 2014-03-04 MED ORDER — ONDANSETRON HCL 4 MG PO TABS: 4.0000 mg | ORAL_TABLET | Freq: Four times a day (QID) | ORAL | Status: DC | PRN

## 2014-03-04 MED ORDER — POTASSIUM CHLORIDE IN NACL 20-0.9 MEQ/L-% IV SOLN
INTRAVENOUS | Status: DC
  Administered 2014-03-04 – 2014-03-07 (×5): via INTRAVENOUS
  Filled 2014-03-04 (×6): qty 1000

## 2014-03-04 MED ORDER — INSULIN GLARGINE 100 UNIT/ML SOLOSTAR PEN: 40.0000 [IU] | PEN_INJECTOR | Freq: Every day | SUBCUTANEOUS | Status: DC

## 2014-03-04 MED ORDER — ALENDRONATE SODIUM 70 MG PO TABS: 70.0000 mg | ORAL_TABLET | ORAL | Status: DC

## 2014-03-04 MED ORDER — OXYCODONE HCL 5 MG PO TABS
10.0000 mg | ORAL_TABLET | ORAL | Status: DC | PRN
  Administered 2014-03-04: 10 mg via ORAL
  Filled 2014-03-04: qty 2

## 2014-03-04 MED ORDER — ALBUTEROL SULFATE (2.5 MG/3ML) 0.083% IN NEBU: 3.0000 mL | INHALATION_SOLUTION | Freq: Four times a day (QID) | RESPIRATORY_TRACT | Status: DC | PRN

## 2014-03-04 MED ORDER — INSULIN ASPART 100 UNIT/ML ~~LOC~~ SOLN
0.0000 [IU] | Freq: Three times a day (TID) | SUBCUTANEOUS | Status: DC
  Administered 2014-03-04: 11 [IU] via SUBCUTANEOUS
  Administered 2014-03-04 – 2014-03-05 (×2): 4 [IU] via SUBCUTANEOUS
  Administered 2014-03-05: 11 [IU] via SUBCUTANEOUS
  Administered 2014-03-05: 7 [IU] via SUBCUTANEOUS
  Administered 2014-03-06 (×2): 4 [IU] via SUBCUTANEOUS
  Administered 2014-03-06: 7 [IU] via SUBCUTANEOUS
  Administered 2014-03-07 (×2): 3 [IU] via SUBCUTANEOUS
  Administered 2014-03-07: 7 [IU] via SUBCUTANEOUS
  Administered 2014-03-08 (×2): 4 [IU] via SUBCUTANEOUS
  Administered 2014-03-09: 3 [IU] via SUBCUTANEOUS
  Administered 2014-03-09 – 2014-03-12 (×6): 4 [IU] via SUBCUTANEOUS
  Administered 2014-03-12: 3 [IU] via SUBCUTANEOUS
  Administered 2014-03-13 (×2): 4 [IU] via SUBCUTANEOUS

## 2014-03-04 MED ORDER — ENOXAPARIN SODIUM 40 MG/0.4ML ~~LOC~~ SOLN
40.0000 mg | SUBCUTANEOUS | Status: DC
  Administered 2014-03-04: 40 mg via SUBCUTANEOUS
  Filled 2014-03-04 (×2): qty 0.4

## 2014-03-04 MED ORDER — OXYCODONE HCL 5 MG PO TABS: 2.5000 mg | ORAL_TABLET | ORAL | Status: DC | PRN

## 2014-03-04 MED ORDER — INSULIN GLARGINE 100 UNIT/ML ~~LOC~~ SOLN
40.0000 [IU] | Freq: Every day | SUBCUTANEOUS | Status: DC
  Administered 2014-03-04 – 2014-03-07 (×4): 40 [IU] via SUBCUTANEOUS
  Filled 2014-03-04 (×4): qty 0.4

## 2014-03-04 MED ORDER — METHOCARBAMOL 500 MG PO TABS
500.0000 mg | ORAL_TABLET | Freq: Four times a day (QID) | ORAL | Status: DC | PRN
  Administered 2014-03-04 – 2014-03-10 (×5): 500 mg via ORAL
  Filled 2014-03-04 (×6): qty 1

## 2014-03-04 MED ORDER — HYDROMORPHONE HCL 1 MG/ML IJ SOLN
0.5000 mg | INTRAMUSCULAR | Status: DC | PRN
  Administered 2014-03-05 – 2014-03-06 (×4): 0.5 mg via INTRAVENOUS
  Filled 2014-03-04 (×5): qty 1

## 2014-03-04 MED ORDER — MORPHINE SULFATE 2 MG/ML IJ SOLN
2.0000 mg | INTRAMUSCULAR | Status: DC | PRN
  Administered 2014-03-04 – 2014-03-06 (×2): 2 mg via INTRAVENOUS
  Filled 2014-03-04 (×2): qty 1

## 2014-03-04 MED ORDER — ONDANSETRON HCL 4 MG/2ML IJ SOLN: 4.0000 mg | Freq: Four times a day (QID) | INTRAMUSCULAR | Status: DC | PRN

## 2014-03-04 MED ORDER — METHOCARBAMOL 1000 MG/10ML IJ SOLN
500.0000 mg | Freq: Four times a day (QID) | INTRAVENOUS | Status: DC | PRN
  Filled 2014-03-04: qty 5
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
## 2014-03-04 NOTE — Progress Notes (Signed)
UR completed.  Await therapy evals to determine pt's next best level of care.   Carlyle LipaMichelle Kamoni Gentles, RN BSN MHA CCM Trauma/Neuro ICU Case Manager [phone]
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
## 2014-03-05 ENCOUNTER — Encounter (HOSPITAL_COMMUNITY): Payer: Self-pay | Admitting: Anesthesiology

## 2014-03-05 ENCOUNTER — Inpatient Hospital Stay (HOSPITAL_COMMUNITY): Payer: Medicare Other

## 2014-03-05 ENCOUNTER — Inpatient Hospital Stay (HOSPITAL_COMMUNITY): Payer: Medicare Other | Admitting: Anesthesiology

## 2014-03-05 LAB — BASIC METABOLIC PANEL
Anion gap: 12 (ref 5–15)
BUN: 13 mg/dL (ref 6–23)
CO2: 21 mmol/L (ref 19–32)
CREATININE: 0.57 mg/dL (ref 0.50–1.10)
Calcium: 8.6 mg/dL (ref 8.4–10.5)
Chloride: 104 mmol/L (ref 96–112)
GFR calc non Af Amer: 89 mL/min — ABNORMAL LOW (ref >90)
Glucose, Bld: 259 mg/dL — ABNORMAL HIGH (ref 70–99)
Potassium: 4.6 mmol/L (ref 3.5–5.1)
Sodium: 137 mmol/L (ref 135–145)

## 2014-03-05 LAB — GLUCOSE, CAPILLARY
GLUCOSE-CAPILLARY: 251 mg/dL — AB (ref 70–99)
GLUCOSE-CAPILLARY: 231 mg/dL — AB (ref 70–99)
GLUCOSE-CAPILLARY: 182 mg/dL — AB (ref 70–99)
GLUCOSE-CAPILLARY: 235 mg/dL — AB (ref 70–99)

## 2014-03-05 LAB — HEMOGLOBIN A1C
Hgb A1c MFr Bld: 9 % — ABNORMAL HIGH (ref 4.8–5.6)
MEAN PLASMA GLUCOSE: 212 mg/dL

## 2014-03-05 LAB — APTT: APTT: 31 s (ref 24–37)

## 2014-03-05 MED ORDER — ROPIVACAINE HCL 2 MG/ML IJ SOLN
8.0000 mL/h | INTRAMUSCULAR | Status: DC
  Administered 2014-03-05 – 2014-03-07 (×3): 8 mL/h via EPIDURAL
  Filled 2014-03-05 (×3): qty 200

## 2014-03-05 MED ORDER — WHITE PETROLATUM GEL
Status: AC
  Filled 2014-03-05: qty 1

## 2014-03-05 NOTE — Anesthesia Procedure Notes (Addendum)
Epidural Patient location during procedure: ICU  Staffing Anesthesiologist: Xayvier Vallez, CHRIS Performed by: anesthesiologist   Preanesthetic Checklist Completed: patient identified, surgical consent, pre-op evaluation, timeout performed, IV checked, risks and benefits discussed and monitors and equipment checked  Epidural Patient position: sitting Prep: site prepped and draped and DuraPrep Patient monitoring: heart rate, cardiac monitor, continuous pulse ox and blood pressure Approach: right paramedian Location: thoracic (1-12) Injection technique: LOR saline and LOR air  Needle:  Needle type: Tuohy  Needle gauge: 17 G Needle length: 9 cm Needle insertion depth: 8 cm Catheter type: closed end flexible Catheter size: 19 Gauge Test dose: 1.5% lidocaine with Epi 1:200 K and negative  Assessment Events: blood not aspirated, injection not painful, no injection resistance, negative IV test and no paresthesia  Additional Notes H+P and labs checked, risks and benefits discussed with the patient, consent obtained, procedure tolerated well and without complications.  Reason for block:procedure for pain

## 2014-03-05 NOTE — Progress Notes (Signed)
Inpatient Diabetes Program Recommendations  AACE/ADA: New Consensus Statement on Inpatient Glycemic Control (2013)  Target Ranges:  Prepandial:   less than 140 mg/dL      Peak postprandial:   less than 180 mg/dL (1-2 hours)      Critically ill patients:  140 - 180 mg/dL   Reason for Assessment:  Results for Denise [name], Denise [name] (MRN [id number]) as of 03/05/2014 11:22  Ref. Range 03/04/2014 11:39 03/04/2014 15:59 03/04/2014 19:22 03/05/2014 07:13 03/05/2014 11:00  Glucose-Capillary Latest Range: 70-99 mg/dL 782274 (H) 956154 (H) 213170 (H) 251 (H) 231 (H)  Results for Denise [name], Denise [name] (MRN [id number]) as of 03/05/2014 11:22  Ref. Range 03/04/2014 02:25  Hemoglobin A1C Latest Range: 4.8-5.6 % 9.0 (H)   Diabetes history: Type 2 diabetes Outpatient Diabetes medications: Glyburide 5 mg daily, Lantus 40 units daily, Metformin 500 mg bid, Tradgenta 5 mg daily Current orders for Inpatient glycemic control:  Novolog resist. Tid with meals, Glyburide 5 mg daily, Metformin 500 mg bid, Tradgenta 5 mg daily  Consider increasing Lantus to 50 units q HS and also consider adding Novolog meal coverage 4 units tid with meals-Hold if patient eats less than 50%.   Will follow.  Thanks, Beryl MeagerJenny Priyansh Pry, RN, BC-ADM Inpatient Diabetes Coordinator Pager [phone]
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
## 2014-03-05 NOTE — Progress Notes (Signed)
Rehab Admissions Coordinator Note:  Patient was screened by Astoria Condon L for appropriateness for an Inpatient Acute Rehab Consult.  At this time, we are recommending Inpatient Rehab consult.  Apoorva Bugay L 03/05/2014, 11:28 AM  I can be reached at [phone].

## 2014-03-05 NOTE — Progress Notes (Addendum)
Dilaudid pulled out of pyxis as a 0.5 dose, but 1 mg was given thus none was wasted.

## 2014-03-05 NOTE — Progress Notes (Signed)
Subjective: Complains of right-sided rib pain.  Only moderate pain in  Her right hip and right  Clavicle area.  Objective: Vital signs in last 24 hours: Temp:  [97.6 F (36.4 C)-99.1 F (37.3 C)] 97.6 F (36.4 C) (02/16 1100) Pulse Rate:  [37-113] 89 (02/16 1100) Resp:  [11-32] 12 (02/16 1100) BP: (102-171)/(33-109) 102/83 mmHg (02/16 1100) SpO2:  [90 %-100 %] 98 % (02/16 1100)  Intake/Output from previous day: 02/15 0701 - 02/16 0700 In: 1150 [I.V.:1150] Out: 750 [Urine:750] Intake/Output this shift: Total I/O In: 200 [I.V.:200] Out: 550 [Urine:550]   Recent Labs  03/03/14 2154 03/03/14 2207 03/04/14 0225  HGB 15.0 16.0* 14.7    Recent Labs  03/03/14 2154 03/03/14 2207 03/04/14 0225  WBC 16.5*  --  24.0*  RBC 4.70  --  4.63  HCT 44.3 47.0* 43.6  PLT 221  --  205    Recent Labs  03/04/14 0225 03/05/14 0246  NA 136 137  K 4.2 4.6  CL 104 104  CO2 24 21  BUN 11 13  CREATININE 0.66 0.57  GLUCOSE 298* 259*  CALCIUM 8.8 8.6    Recent Labs  03/03/14 2154  INR 0.97  right clavicle exam: Moderate tenderness.  Neurovascular status intact to the right upper extremity. Right hip exam:  Minimal pain with log rolling of her right  Leg.  Calf soft and nontender.  Tender about the pubic ramus    Assessment/Plan: 1.  Right clavicle fracture 2.  Right sacral fracture 3. Right inferior and superior pubic rami fractures.  Up with physical therapy once pain is controlled.   WBAT on right lower and upper extremity. Trauma service has requested possible thoracic epidural. We will follow.   Jasaiah Karwowski G 03/05/2014, 1:59 PM
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
## 2014-03-05 NOTE — Progress Notes (Signed)
Orthopedic Tech Progress Note Patient Details:  Laqueta CarinaBarbara M Sparacino 07/18/1940 [id number]  Ortho Devices Type of Ortho Device: Arm sling Ortho Device/Splint Location: rue Ortho Device/Splint Interventions: Application   Nikki DomCrawford, Marijayne Rauth 03/05/2014, 8:41 AM
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
## 2014-03-06 LAB — GLUCOSE, CAPILLARY
GLUCOSE-CAPILLARY: 186 mg/dL — AB (ref 70–99)
GLUCOSE-CAPILLARY: 198 mg/dL — AB (ref 70–99)
Glucose-Capillary: 227 mg/dL — ABNORMAL HIGH (ref 70–99)
Glucose-Capillary: 189 mg/dL — ABNORMAL HIGH (ref 70–99)

## 2014-03-06 MED ORDER — ROPIVACAINE HCL 2 MG/ML IJ SOLN
INTRAMUSCULAR | Status: DC | PRN
  Administered 2014-03-05: 8 mL/h via EPIDURAL

## 2014-03-06 MED ORDER — SODIUM CHLORIDE 0.9 % IV BOLUS (SEPSIS)
500.0000 mL | Freq: Once | INTRAVENOUS | Status: AC
  Administered 2014-03-06: 500 mL via INTRAVENOUS

## 2014-03-06 MED ORDER — LIDOCAINE-EPINEPHRINE (PF) 1.5 %-1:200000 IJ SOLN
INTRAMUSCULAR | Status: DC | PRN
  Administered 2014-03-05: 5 mg

## 2014-03-06 NOTE — Progress Notes (Signed)
Trauma Service Note  Subjective: Patietn is confused but [facility]  Much more comfortable with epidural catheter.  Could not work with PT or OT yesterday.  Objective: Vital signs in last 24 hours: Temp:  [97.6 F (36.4 C)-100 F (37.8 C)] 98.8 F (37.1 C) (02/17 0752) Pulse Rate:  [75-106] 76 (02/17 0800) Resp:  [9-21] 15 (02/17 0800) BP: (102-151)/(32-103) 124/40 mmHg (02/17 0800) SpO2:  [90 %-100 %] 90 % (02/17 0800)    Intake/Output from previous day: 02/16 0701 - 02/17 0700 In: 1617 [P.O.:417; I.V.:1200] Out: 1425 [Urine:1425] Intake/Output this shift: Total I/O In: 100 [I.V.:100] Out: -   General: Sleeping comfortably  Lungs: Clear.  No longer with rib cage pain.  Abd: soft, good bowel sounds.  Extremities: Intact.  Right shoulder in sling for clavicle fracture.  Neuro: confused and disoriented, but stable.  Lab Results: CBC   Recent Labs  03/03/14 2154 03/03/14 2207 03/04/14 0225  WBC 16.5*  --  24.0*  HGB 15.0 16.0* 14.7  HCT 44.3 47.0* 43.6  PLT 221  --  205   BMET  Recent Labs  03/04/14 0225 03/05/14 0246  NA 136 137  K 4.2 4.6  CL 104 104  CO2 24 21  GLUCOSE 298* 259*  BUN 11 13  CREATININE 0.66 0.57  CALCIUM 8.8 8.6   PT/INR  Recent Labs  03/03/14 2154  LABPROT 13.0  INR 0.97   ABG No results for input(s): PHART, HCO3 in the last 72 hours.  Invalid input(s): PCO2, PO2  Studies/Results: Dg Chest Port 1 View  03/05/2014   CLINICAL DATA:  Multiple trauma with rib fractures  EXAM: PORTABLE CHEST - 1 VIEW  COMPARISON:  Portable chest x-ray of March 04, 2014  FINDINGS: The lungs are well-expanded. A small right apical pneumothorax is again demonstrated. Multiple displaced posterior upper right rib fractures are evident. There is no significant right pleural effusion. On the left the lung is well-expanded. There is subsegmental atelectasis at the left lung base. The cardiopericardial silhouette mildly enlarged.  The mediastinum is normal in width. The pulmonary vascularity is not engorged.  IMPRESSION: Persistent small right apical pneumothorax in the setting of multiple right rib fractures. There is mild subsegmental atelectasis at the left lung base which is more conspicuous today. Similar findings at the right lung base are stable.   Electronically Signed   By: David  [place]   On: 03/05/2014 07:33    Anti-infectives: Anti-infectives    None      Assessment/Plan: s/p  PT/OT  contnue epidural.  LOS: 3 days   Marta LamasJames O. Gae BonWyatt, III, MD, FACS [phone] Trauma Surgeon 03/06/2014

## 2014-03-06 NOTE — Anesthesia Preprocedure Evaluation (Signed)
Anesthesia Evaluation  Patient identified by MRN, date of birth, ID band Patient awake    Reviewed: Allergy & Precautions, NPO status , Patient's Chart, lab work & pertinent test results, Unable to perform ROS - Chart review only  History of Anesthesia Complications Negative for: history of anesthetic complications  Airway Mallampati: III  TM Distance: >3 FB Neck ROM: Full    Dental  (+) Teeth Intact   Pulmonary asthma , COPD COPD inhaler,  + rhonchi   + decreased breath sounds      Cardiovascular hypertension, Pt. on medications Rhythm:Regular     Neuro/Psych dementia Neuromuscular disease    GI/Hepatic Neg liver ROS, GERD-  ,  Endo/Other  diabetes, Type 2, Insulin Dependent, Oral Hypoglycemic AgentsMorbid obesity  Renal/GU negative Renal ROS     Musculoskeletal  (+) Arthritis -,   Abdominal   Peds  Hematology negative hematology ROS (+)   Anesthesia Other Findings Right 2-7 rib fracture, p(elvis fracture, right clavicle fracture  Reproductive/Obstetrics                             Anesthesia Physical Anesthesia Plan  ASA: III  Anesthesia Plan: Epidural   Post-op Pain Management:    Induction:   Airway Management Planned:   Additional Equipment:   Intra-op Plan:   Post-operative Plan:   Informed Consent: I have reviewed the patients History and Physical, chart, labs and discussed the procedure including the risks, benefits and alternatives for the proposed anesthesia with the patient or authorized representative who has indicated his/her understanding and acceptance.   Consent reviewed with POA  Plan Discussed with: Anesthesiologist  Anesthesia Plan Comments:         Anesthesia Quick Evaluation

## 2014-03-07 DIAGNOSIS — S32511S Fracture of superior rim of right pubis, sequela: Secondary | ICD-10-CM

## 2014-03-07 DIAGNOSIS — S270XXS Traumatic pneumothorax, sequela: Secondary | ICD-10-CM

## 2014-03-07 DIAGNOSIS — S2241XS Multiple fractures of ribs, right side, sequela: Secondary | ICD-10-CM

## 2014-03-07 DIAGNOSIS — S42001S Fracture of unspecified part of right clavicle, sequela: Secondary | ICD-10-CM

## 2014-03-07 LAB — GLUCOSE, CAPILLARY
GLUCOSE-CAPILLARY: 149 mg/dL — AB (ref 70–99)
GLUCOSE-CAPILLARY: 149 mg/dL — AB (ref 70–99)
GLUCOSE-CAPILLARY: 142 mg/dL — AB (ref 70–99)
Glucose-Capillary: 243 mg/dL — ABNORMAL HIGH (ref 70–99)

## 2014-03-07 MED ORDER — STUDY - INVESTIGATIONAL DRUG SIMPLE RECORD: 100.0000 mg | Freq: Every day | Status: DC

## 2014-03-07 MED ORDER — STUDY - INVESTIGATIONAL DRUG SIMPLE RECORD: 125.0000 mg | Freq: Every day | Status: DC

## 2014-03-07 MED ORDER — STUDY - INVESTIGATIONAL DRUG SIMPLE RECORD
125.0000 mg | Freq: Every day | Status: DC
  Filled 2014-03-07: qty 125

## 2014-03-07 MED ORDER — STUDY - INVESTIGATIONAL DRUG SIMPLE RECORD
100.0000 mg | Freq: Every day | Status: DC
  Filled 2014-03-07: qty 100

## 2014-03-07 MED ORDER — POTASSIUM CHLORIDE IN NACL 20-0.9 MEQ/L-% IV SOLN
INTRAVENOUS | Status: DC
  Administered 2014-03-07: 21:00:00 via INTRAVENOUS
  Filled 2014-03-07 (×5): qty 1000
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
## 2014-03-07 NOTE — Progress Notes (Signed)
Inpatient Diabetes Program Recommendations  AACE/ADA: New Consensus Statement on Inpatient Glycemic Control (2013)  Target Ranges:  Prepandial:   less than 140 mg/dL      Peak postprandial:   less than 180 mg/dL (1-2 hours)      Critically ill patients:  140 - 180 mg/dL   Reason for Assessment: Results for Denise CarinaICKE, [name] (MRN [id number]) as of 03/07/2014 15:48  Ref. Range 03/06/2014 11:52 03/06/2014 15:57 03/06/2014 19:56 03/07/2014 08:00 03/07/2014 11:51  Glucose-Capillary Latest Range: 70-99 mg/dL 562227 (H) 130189 (H) 865198 (H) 149 (H) 243 (H)     Consider adding Novolog meal coverage 4 units tid with meals (Hold if patient eats less than 50%).  Thanks, Beryl MeagerJenny Bary Limbach, RN, BC-ADM Inpatient Diabetes Coordinator Pager [phone]
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
## 2014-03-07 NOTE — Progress Notes (Signed)
Subjective: Improvement of pain with thoracic epidural.  Wearing sling.  Objective: Vital signs in last 24 hours: Temp:  [98.1 F (36.7 C)-99.8 F (37.7 C)] 99.8 F (37.7 C) (02/18 1157) Pulse Rate:  [59-83] 59 (02/18 0900) Resp:  [11-17] 14 (02/18 0900) BP: (82-157)/(34-106) 105/73 mmHg (02/18 0900) SpO2:  [97 %-100 %] 100 % (02/18 0900)  Intake/Output from previous day: 02/17 0701 - 02/18 0700 In: 1850 [P.O.:600; I.V.:1250] Out: 1105 [Urine:1105] Intake/Output this shift: Total I/O In: 580 [P.O.:480; I.V.:100] Out: 235 [Urine:235]  No results for input(s): HGB in the last 72 hours. No results for input(s): WBC, RBC, HCT, PLT in the last 72 hours.  Recent Labs  03/05/14 0246  NA 137  K 4.6  CL 104  CO2 21  BUN 13  CREATININE 0.57  GLUCOSE 259*  CALCIUM 8.6   No results for input(s): LABPT, INR in the last 72 hours. Right clavicle exam: Moderate right clavicle tenderness.  No frank deformity.  Sling intact. Right hip exam: Moderate discomfort with range of motion of right hip.  Tender over the ischial tuberosity.  Right calf soft and nontender.   Assessment/Plan: 1.  Nondisplaced right clavicle fracture. 2.  Right inferior and superior pubic rami fractures. 3.  Right sacral fracture. Plan: Up with physical therapy weightbearing as tolerated on right lower and upper extremity. Continue sling on right arm.  May remove for bathing.  Physical therapy can work on right upper extremity range of motion as tolerated including hand,wrists, elbows, and gentle shoulder range of motion. We will sign off.  Please call if needed.  We will see the patient in the office in 2 weeks.   Windi Toro G 03/07/2014, 1:59 PM
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
## 2014-03-08 DIAGNOSIS — S32511A Fracture of superior rim of right pubis, initial encounter for closed fracture: Secondary | ICD-10-CM | POA: Diagnosis present

## 2014-03-08 DIAGNOSIS — S2241XA Multiple fractures of ribs, right side, initial encounter for closed fracture: Secondary | ICD-10-CM | POA: Diagnosis present

## 2014-03-08 DIAGNOSIS — S3210XA Unspecified fracture of sacrum, initial encounter for closed fracture: Secondary | ICD-10-CM | POA: Diagnosis present

## 2014-03-08 DIAGNOSIS — S42001A Fracture of unspecified part of right clavicle, initial encounter for closed fracture: Secondary | ICD-10-CM | POA: Diagnosis present

## 2014-03-08 DIAGNOSIS — S270XXA Traumatic pneumothorax, initial encounter: Secondary | ICD-10-CM | POA: Diagnosis present

## 2014-03-08 LAB — GLUCOSE, CAPILLARY
GLUCOSE-CAPILLARY: 113 mg/dL — AB (ref 70–99)
Glucose-Capillary: 175 mg/dL — ABNORMAL HIGH (ref 70–99)
Glucose-Capillary: 187 mg/dL — ABNORMAL HIGH (ref 70–99)

## 2014-03-08 MED ORDER — ENOXAPARIN SODIUM 30 MG/0.3ML ~~LOC~~ SOLN
30.0000 mg | Freq: Two times a day (BID) | SUBCUTANEOUS | Status: DC
  Filled 2014-03-08: qty 0.3

## 2014-03-08 MED ORDER — WHITE PETROLATUM GEL
Status: AC
  Administered 2014-03-08: 0.2
  Filled 2014-03-08: qty 1

## 2014-03-08 MED ORDER — TRAMADOL HCL 50 MG PO TABS
100.0000 mg | ORAL_TABLET | Freq: Four times a day (QID) | ORAL | Status: DC
  Administered 2014-03-08 – 2014-03-13 (×22): 100 mg via ORAL
  Filled 2014-03-08 (×22): qty 2

## 2014-03-08 MED ORDER — NAPROXEN 250 MG PO TABS
500.0000 mg | ORAL_TABLET | Freq: Two times a day (BID) | ORAL | Status: DC
  Administered 2014-03-08 – 2014-03-13 (×12): 500 mg via ORAL
  Filled 2014-03-08 (×4): qty 1
  Filled 2014-03-08: qty 2
  Filled 2014-03-08 (×2): qty 1
  Filled 2014-03-08: qty 2
  Filled 2014-03-08 (×5): qty 1

## 2014-03-08 MED ORDER — STUDY - INVESTIGATIONAL DRUG SIMPLE RECORD
100.0000 mg | Freq: Every day | Status: DC
  Administered 2014-03-11 – 2014-03-13 (×2): 100 mg via ORAL
  Filled 2014-03-08 (×2): qty 100

## 2014-03-08 MED ORDER — ENOXAPARIN SODIUM 30 MG/0.3ML ~~LOC~~ SOLN: 30.0000 mg | SUBCUTANEOUS | Status: DC

## 2014-03-08 MED ORDER — BISACODYL 10 MG RE SUPP
10.0000 mg | Freq: Once | RECTAL | Status: AC
  Administered 2014-03-08: 10 mg via RECTAL
  Filled 2014-03-08: qty 1

## 2014-03-08 MED ORDER — DOCUSATE SODIUM 100 MG PO CAPS
100.0000 mg | ORAL_CAPSULE | Freq: Two times a day (BID) | ORAL | Status: DC
  Administered 2014-03-08 – 2014-03-12 (×9): 100 mg via ORAL
  Filled 2014-03-08 (×9): qty 1

## 2014-03-08 MED ORDER — STUDY - INVESTIGATIONAL DRUG SIMPLE RECORD
125.0000 mg | Freq: Every day | Status: DC
  Administered 2014-03-11 – 2014-03-13 (×2): 125 mg via ORAL
  Filled 2014-03-08 (×2): qty 125

## 2014-03-08 MED ORDER — HYDROMORPHONE HCL 1 MG/ML IJ SOLN
0.5000 mg | INTRAMUSCULAR | Status: DC | PRN
  Administered 2014-03-08 – 2014-03-11 (×3): 0.5 mg via INTRAVENOUS
  Filled 2014-03-08 (×3): qty 1

## 2014-03-08 MED ORDER — HYDROCODONE-ACETAMINOPHEN 5-325 MG PO TABS
0.5000 | ORAL_TABLET | ORAL | Status: DC | PRN
  Administered 2014-03-08 – 2014-03-10 (×7): 2 via ORAL
  Administered 2014-03-11: 1 via ORAL
  Administered 2014-03-11 – 2014-03-12 (×2): 2 via ORAL
  Administered 2014-03-13: 1 via ORAL
  Filled 2014-03-08 (×2): qty 1
  Filled 2014-03-08 (×9): qty 2

## 2014-03-08 MED ORDER — POLYETHYLENE GLYCOL 3350 17 G PO PACK
17.0000 g | PACK | Freq: Every day | ORAL | Status: DC
  Administered 2014-03-08 – 2014-03-10 (×3): 17 g via ORAL
  Filled 2014-03-08 (×6): qty 1

## 2014-03-08 MED ORDER — INSULIN GLARGINE 100 UNIT/ML ~~LOC~~ SOLN
45.0000 [IU] | Freq: Every day | SUBCUTANEOUS | Status: DC
  Administered 2014-03-08 – 2014-03-12 (×5): 45 [IU] via SUBCUTANEOUS
  Filled 2014-03-08 (×6): qty 0.45

## 2014-03-08 MED ORDER — ROPIVACAINE HCL 2 MG/ML IJ SOLN
8.0000 mL/h | INTRAMUSCULAR | Status: DC
  Administered 2014-03-09 – 2014-03-11 (×3): 8 mL/h via EPIDURAL
  Filled 2014-03-08 (×5): qty 200

## 2014-03-08 NOTE — Clinical Social Work Note (Signed)
Clinical Social Worker continuing to follow patient and family for support and discharge planning needs.  CSW spoke with patient husband at bedside regarding patient current insurance and questions regarding long term care policies.  Patient currently has [employer] [place].  Patient husband has all paperwork and happy to provide if needed during hospitalization or for rehab needs.  Patient continues to have issues with memory and repeating things several times during visit.  Patient husband states, "I miss having her around.  Think I can get her home soon."  Patient and patient husband with a very loving relationship and good support.  CSW to continue to follow for support and assist with discharge planning needs.  Denise [name], [place]CSW [phone]

## 2014-03-08 NOTE — Progress Notes (Signed)
Saracatinib: Alzheimer's study drug. [facility] [phone]) that patient takes two 50mg  tablets + one 125mg  tablet every morning. Can be with or without food.   Have had multiple conversations with study coordinator [name] [phone])-- confirmed hospitalization did not disqualify her. She also spoke with patient's husband and the primary investigator, and decision was made to hold medication until Monday on 2/22 in order to avoid possible nausea/interactions with her epidural.  Medication is stored in main pharmacy.   Denise [name] D. Rayn Enderson, PharmD, BCPS Clinical Pharmacist 03/08/2014 3:39 PM

## 2014-03-08 NOTE — Progress Notes (Signed)
Called dr wyatt regarding pt had not voided since foley d/c'd this am at 0930, bladder scanned for only 46cc. Will in and out if greater then 300cc to leave foley in. Will continue to monitor.

## 2014-03-08 NOTE — Progress Notes (Signed)
I met with pt and her spouse at bedside. We discussed an inpt rehab admission pending insurance approval. Thus far her mobility has been limited by pain. I will follow up Monday to assess her activity tolerance and hopefully begin [employer] authorization for an inpt rehab admission. Pt reports no BM since admission. RN is aware with a plan initiated. [phone]

## 2014-03-08 NOTE — Progress Notes (Signed)
Restarted epidural. Dr Maple Hudsonmoser mentionned to monitor bp q325min x5530min then may go back to q4hours afterwards.
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
## 2014-03-09 LAB — GLUCOSE, CAPILLARY
GLUCOSE-CAPILLARY: 179 mg/dL — AB (ref 70–99)
GLUCOSE-CAPILLARY: 147 mg/dL — AB (ref 70–99)
Glucose-Capillary: 164 mg/dL — ABNORMAL HIGH (ref 70–99)
Glucose-Capillary: 118 mg/dL — ABNORMAL HIGH (ref 70–99)
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
## 2014-03-10 LAB — GLUCOSE, CAPILLARY
GLUCOSE-CAPILLARY: 187 mg/dL — AB (ref 70–99)
Glucose-Capillary: 109 mg/dL — ABNORMAL HIGH (ref 70–99)
Glucose-Capillary: 160 mg/dL — ABNORMAL HIGH (ref 70–99)
Glucose-Capillary: 165 mg/dL — ABNORMAL HIGH (ref 70–99)
Glucose-Capillary: 69 mg/dL — ABNORMAL LOW (ref 70–99)
Glucose-Capillary: 82 mg/dL (ref 70–99)
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
## 2014-03-10 NOTE — Progress Notes (Signed)
Attempted to get pt up to chair but pt was unable to stand d/t pain issues. Readjusted pt in bed. Will continue to monitor.

## 2014-03-11 LAB — GLUCOSE, CAPILLARY
Glucose-Capillary: 99 mg/dL (ref 70–99)
Glucose-Capillary: 177 mg/dL — ABNORMAL HIGH (ref 70–99)
Glucose-Capillary: 148 mg/dL — ABNORMAL HIGH (ref 70–99)
Glucose-Capillary: 81 mg/dL (ref 70–99)

## 2014-03-11 LAB — CBC
HEMATOCRIT: 37.8 % (ref 36.0–46.0)
Hemoglobin: 12.7 g/dL (ref 12.0–15.0)
MCH: 31.5 pg (ref 26.0–34.0)
MCHC: 33.6 g/dL (ref 30.0–36.0)
MCV: 93.8 fL (ref 78.0–100.0)
PLATELETS: 238 10*3/uL (ref 150–400)
RBC: 4.03 MIL/uL (ref 3.87–5.11)
RDW: 13.6 % (ref 11.5–15.5)
WBC: 14.4 10*3/uL — AB (ref 4.0–10.5)

## 2014-03-11 LAB — PROTIME-INR
INR: 1.09 (ref 0.00–1.49)
Prothrombin Time: 14.2 seconds (ref 11.6–15.2)

## 2014-03-11 MED ORDER — BETHANECHOL CHLORIDE 25 MG PO TABS
25.0000 mg | ORAL_TABLET | Freq: Three times a day (TID) | ORAL | Status: DC
  Administered 2014-03-11 – 2014-03-13 (×8): 25 mg via ORAL
  Filled 2014-03-11 (×10): qty 1
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
## 2014-03-11 NOTE — Progress Notes (Signed)
Pt unable to void since removal of Foley catheter at 1300. Pt frequently calling out saying she needs to urinate but unable to start stream, bladder scanned for the second time at 2300 and showed 300cc in the bladder (first scan on dayshift only showed 26cc). I&O cath completed, 650cc of amber urine returned. Pt states she feels better, no further complaints. Will continue monitoring
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
## 2014-03-11 NOTE — Progress Notes (Signed)
Epidural note Platelets and coags WNL. Epidural catheter pulled without issue, tip intact.
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
## 2014-03-11 NOTE — Progress Notes (Signed)
[facility] to [facility]pt rehab when medically ready. [phone]
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
## 2014-03-11 NOTE — Progress Notes (Signed)
UR completed.  Hopeful for insurance approval for CIR when medically ready.   Carlyle LipaMichelle Nicola Quesnell, RN BSN MHA CCM Trauma/Neuro ICU Case Manager [phone]

## 2014-03-12 LAB — GLUCOSE, CAPILLARY
GLUCOSE-CAPILLARY: 111 mg/dL — AB (ref 70–99)
GLUCOSE-CAPILLARY: 62 mg/dL — AB (ref 70–99)
Glucose-Capillary: 183 mg/dL — ABNORMAL HIGH (ref 70–99)
Glucose-Capillary: 142 mg/dL — ABNORMAL HIGH (ref 70–99)

## 2014-03-12 MED ORDER — ENOXAPARIN SODIUM 40 MG/0.4ML ~~LOC~~ SOLN
40.0000 mg | SUBCUTANEOUS | Status: DC
  Administered 2014-03-12 – 2014-03-13 (×2): 40 mg via SUBCUTANEOUS
  Filled 2014-03-12 (×3): qty 0.4

## 2014-03-12 MED ORDER — DEXTROSE 50 % IV SOLN
INTRAVENOUS | Status: AC
  Administered 2014-03-12: 25 mL
  Filled 2014-03-12: qty 50

## 2014-03-12 MED ORDER — TAMSULOSIN HCL 0.4 MG PO CAPS
0.4000 mg | ORAL_CAPSULE | Freq: Every day | ORAL | Status: DC
  Administered 2014-03-12 – 2014-03-13 (×2): 0.4 mg via ORAL
  Filled 2014-03-12 (×3): qty 1

## 2014-03-12 NOTE — Progress Notes (Signed)
SLP Cancellation Note  Patient Details Name: Denise [name] MRN: [id number] DOB: June 27, 1940   Cancelled treatment:       Reason Eval/Treat Not Completed: Patient declined - her priest arrived at the same time as SLP, and pt requested time to be with him. SLP will return as schedule allows for cognitive treatment.   Maxcine HamLaura Paiewonsky, M.A. CCC-SLP [phone]  Maxcine Hamaiewonsky, [place] [facility] 03/12/2014, 3:47 PM
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
## 2014-03-12 NOTE — Clinical Social Work Note (Signed)
Clinical Social Worker continuing to follow patient and family for support and discharge planning needs.  Inpatient rehab admissions coordinator continuing to pursue inpatient rehab placement with patient insurance.  CSW has initiated SNF placement as back up.  Patient with bed offers available if needed.  CSW remains available for support and to assist with discharge planning needs if deemed appropriate.  Macario GoldsJesse Janssen Zee, [place] [phone]

## 2014-03-12 NOTE — Progress Notes (Signed)
Hypoglycemic Event  CBG: 62  Treatment: D50 IV 25 mL  Symptoms: None  Follow-up CBG: Time:0800 CBG Result:111  Possible Reasons for Event: Unknown  Comments/MD notified: Aubery Lappinghompson    Denise [name], Denise [name]  Remember to initiate Hypoglycemia Order Set & complete
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
## 2014-03-12 NOTE — Progress Notes (Signed)
I await insurance decision for rehab venue . [phone]

## 2014-03-13 ENCOUNTER — Inpatient Hospital Stay (HOSPITAL_COMMUNITY)
Admission: RE | Admit: 2014-03-13 | Discharge: 2014-03-29 | DRG: 560 | Disposition: A | Discharge status: Home Health | Payer: Medicare Other | Source: Intra-hospital | Attending: Physical Medicine & Rehabilitation | Admitting: Physical Medicine & Rehabilitation

## 2014-03-13 DIAGNOSIS — Z79899 Other long term (current) drug therapy: Secondary | ICD-10-CM

## 2014-03-13 DIAGNOSIS — E119 Type 2 diabetes mellitus without complications: Secondary | ICD-10-CM | POA: Diagnosis not present

## 2014-03-13 DIAGNOSIS — S329XXD Fracture of unspecified parts of lumbosacral spine and pelvis, subsequent encounter for fracture with routine healing: Secondary | ICD-10-CM

## 2014-03-13 DIAGNOSIS — F0390 Unspecified dementia without behavioral disturbance: Secondary | ICD-10-CM | POA: Diagnosis not present

## 2014-03-13 DIAGNOSIS — S42001A Fracture of unspecified part of right clavicle, initial encounter for closed fracture: Secondary | ICD-10-CM | POA: Diagnosis present

## 2014-03-13 DIAGNOSIS — Z7982 Long term (current) use of aspirin: Secondary | ICD-10-CM

## 2014-03-13 DIAGNOSIS — M1990 Unspecified osteoarthritis, unspecified site: Secondary | ICD-10-CM | POA: Diagnosis not present

## 2014-03-13 DIAGNOSIS — S2241XA Multiple fractures of ribs, right side, initial encounter for closed fracture: Secondary | ICD-10-CM | POA: Diagnosis present

## 2014-03-13 DIAGNOSIS — S27321D Contusion of lung, unilateral, subsequent encounter: Secondary | ICD-10-CM

## 2014-03-13 DIAGNOSIS — B9620 Unspecified Escherichia coli [E. coli] as the cause of diseases classified elsewhere: Secondary | ICD-10-CM

## 2014-03-13 DIAGNOSIS — S32511S Fracture of superior rim of right pubis, sequela: Secondary | ICD-10-CM

## 2014-03-13 DIAGNOSIS — S270XXS Traumatic pneumothorax, sequela: Secondary | ICD-10-CM

## 2014-03-13 DIAGNOSIS — R339 Retention of urine, unspecified: Secondary | ICD-10-CM

## 2014-03-13 DIAGNOSIS — J449 Chronic obstructive pulmonary disease, unspecified: Secondary | ICD-10-CM | POA: Diagnosis not present

## 2014-03-13 DIAGNOSIS — J45909 Unspecified asthma, uncomplicated: Secondary | ICD-10-CM

## 2014-03-13 DIAGNOSIS — T1490XA Injury, unspecified, initial encounter: Secondary | ICD-10-CM | POA: Insufficient documentation

## 2014-03-13 DIAGNOSIS — I4891 Unspecified atrial fibrillation: Secondary | ICD-10-CM | POA: Diagnosis not present

## 2014-03-13 DIAGNOSIS — E785 Hyperlipidemia, unspecified: Secondary | ICD-10-CM | POA: Diagnosis not present

## 2014-03-13 DIAGNOSIS — S32511A Fracture of superior rim of right pubis, initial encounter for closed fracture: Secondary | ICD-10-CM | POA: Diagnosis present

## 2014-03-13 DIAGNOSIS — Z794 Long term (current) use of insulin: Secondary | ICD-10-CM | POA: Diagnosis not present

## 2014-03-13 DIAGNOSIS — S42001S Fracture of unspecified part of right clavicle, sequela: Secondary | ICD-10-CM

## 2014-03-13 DIAGNOSIS — S3210XD Unspecified fracture of sacrum, subsequent encounter for fracture with routine healing: Secondary | ICD-10-CM | POA: Diagnosis not present

## 2014-03-13 DIAGNOSIS — S42001D Fracture of unspecified part of right clavicle, subsequent encounter for fracture with routine healing: Secondary | ICD-10-CM | POA: Diagnosis not present

## 2014-03-13 DIAGNOSIS — N3281 Overactive bladder: Secondary | ICD-10-CM

## 2014-03-13 DIAGNOSIS — N390 Urinary tract infection, site not specified: Secondary | ICD-10-CM | POA: Diagnosis not present

## 2014-03-13 DIAGNOSIS — S2231XD Fracture of one rib, right side, subsequent encounter for fracture with routine healing: Principal | ICD-10-CM

## 2014-03-13 DIAGNOSIS — I10 Essential (primary) hypertension: Secondary | ICD-10-CM | POA: Diagnosis not present

## 2014-03-13 DIAGNOSIS — R338 Other retention of urine: Secondary | ICD-10-CM | POA: Diagnosis not present

## 2014-03-13 DIAGNOSIS — S270XXA Traumatic pneumothorax, initial encounter: Secondary | ICD-10-CM | POA: Diagnosis present

## 2014-03-13 DIAGNOSIS — S2241XS Multiple fractures of ribs, right side, sequela: Secondary | ICD-10-CM

## 2014-03-13 DIAGNOSIS — S270XXD Traumatic pneumothorax, subsequent encounter: Secondary | ICD-10-CM | POA: Diagnosis not present

## 2014-03-13 LAB — URINALYSIS, ROUTINE W REFLEX MICROSCOPIC
Bilirubin Urine: NEGATIVE
Glucose, UA: NEGATIVE mg/dL
KETONES UR: NEGATIVE mg/dL
NITRITE: NEGATIVE
PROTEIN: NEGATIVE mg/dL
Specific Gravity, Urine: 1.007 (ref 1.005–1.030)
UROBILINOGEN UA: 1 mg/dL (ref 0.0–1.0)
pH: 6 (ref 5.0–8.0)

## 2014-03-13 LAB — URINE MICROSCOPIC-ADD ON

## 2014-03-13 LAB — GLUCOSE, CAPILLARY
GLUCOSE-CAPILLARY: 77 mg/dL (ref 70–99)
GLUCOSE-CAPILLARY: 154 mg/dL — AB (ref 70–99)
Glucose-Capillary: 63 mg/dL — ABNORMAL LOW (ref 70–99)
Glucose-Capillary: 158 mg/dL — ABNORMAL HIGH (ref 70–99)
Glucose-Capillary: 130 mg/dL — ABNORMAL HIGH (ref 70–99)

## 2014-03-13 MED ORDER — DIPHENHYDRAMINE HCL 12.5 MG/5ML PO ELIX: 12.5000 mg | ORAL_SOLUTION | Freq: Four times a day (QID) | ORAL | Status: DC | PRN

## 2014-03-13 MED ORDER — INSULIN GLARGINE 100 UNIT/ML ~~LOC~~ SOLN
38.0000 [IU] | Freq: Every day | SUBCUTANEOUS | Status: DC
  Administered 2014-03-13 – 2014-03-28 (×15): 38 [IU] via SUBCUTANEOUS
  Filled 2014-03-13 (×17): qty 0.38

## 2014-03-13 MED ORDER — STUDY - INVESTIGATIONAL DRUG SIMPLE RECORD
125.0000 mg | Freq: Every day | Status: DC
  Administered 2014-03-14 – 2014-03-29 (×15): 125 mg via ORAL
  Filled 2014-03-13 (×14): qty 125

## 2014-03-13 MED ORDER — ALUM & MAG HYDROXIDE-SIMETH 200-200-20 MG/5ML PO SUSP: 30.0000 mL | ORAL | Status: DC | PRN

## 2014-03-13 MED ORDER — INSULIN GLARGINE 100 UNIT/ML ~~LOC~~ SOLN
38.0000 [IU] | Freq: Every day | SUBCUTANEOUS | Status: DC
  Filled 2014-03-13: qty 0.38

## 2014-03-13 MED ORDER — LIDOCAINE HCL 2 % EX GEL
1.0000 "application " | CUTANEOUS | Status: DC | PRN
  Filled 2014-03-13: qty 5

## 2014-03-13 MED ORDER — RAMIPRIL 10 MG PO CAPS
10.0000 mg | ORAL_CAPSULE | Freq: Every day | ORAL | Status: DC
  Administered 2014-03-14 – 2014-03-29 (×16): 10 mg via ORAL
  Filled 2014-03-13 (×20): qty 1

## 2014-03-13 MED ORDER — MEMANTINE HCL 5 MG PO TABS
5.0000 mg | ORAL_TABLET | Freq: Every day | ORAL | Status: DC
  Administered 2014-03-14 – 2014-03-29 (×16): 5 mg via ORAL
  Filled 2014-03-13 (×20): qty 1

## 2014-03-13 MED ORDER — HYDROCODONE-ACETAMINOPHEN 5-325 MG PO TABS
1.0000 | ORAL_TABLET | ORAL | Status: DC | PRN
  Administered 2014-03-13: 2 via ORAL
  Administered 2014-03-14: 1 via ORAL
  Administered 2014-03-14 (×2): 2 via ORAL
  Administered 2014-03-14 – 2014-03-16 (×5): 1 via ORAL
  Administered 2014-03-16: 2 via ORAL
  Administered 2014-03-16: 1 via ORAL
  Administered 2014-03-17 – 2014-03-28 (×5): 2 via ORAL
  Filled 2014-03-13 (×3): qty 2
  Filled 2014-03-13 (×2): qty 1
  Filled 2014-03-13: qty 2
  Filled 2014-03-13 (×2): qty 1
  Filled 2014-03-13 (×2): qty 2
  Filled 2014-03-13: qty 1
  Filled 2014-03-13 (×2): qty 2
  Filled 2014-03-13: qty 1
  Filled 2014-03-13: qty 2
  Filled 2014-03-13: qty 1
  Filled 2014-03-13: qty 2

## 2014-03-13 MED ORDER — GUAIFENESIN-DM 100-10 MG/5ML PO SYRP: 5.0000 mL | ORAL_SOLUTION | Freq: Four times a day (QID) | ORAL | Status: DC | PRN

## 2014-03-13 MED ORDER — METHOCARBAMOL 500 MG PO TABS
500.0000 mg | ORAL_TABLET | Freq: Four times a day (QID) | ORAL | Status: DC | PRN
  Administered 2014-03-14 – 2014-03-29 (×2): 500 mg via ORAL
  Filled 2014-03-13 (×2): qty 1

## 2014-03-13 MED ORDER — ALBUTEROL SULFATE (2.5 MG/3ML) 0.083% IN NEBU: 3.0000 mL | INHALATION_SOLUTION | Freq: Four times a day (QID) | RESPIRATORY_TRACT | Status: DC | PRN

## 2014-03-13 MED ORDER — INSULIN ASPART 100 UNIT/ML ~~LOC~~ SOLN
0.0000 [IU] | Freq: Three times a day (TID) | SUBCUTANEOUS | Status: DC
  Administered 2014-03-14: 2 [IU] via SUBCUTANEOUS
  Administered 2014-03-14 – 2014-03-15 (×2): 3 [IU] via SUBCUTANEOUS
  Administered 2014-03-15: 8 [IU] via SUBCUTANEOUS
  Administered 2014-03-16 – 2014-03-20 (×7): 3 [IU] via SUBCUTANEOUS
  Administered 2014-03-21 – 2014-03-22 (×2): 5 [IU] via SUBCUTANEOUS
  Administered 2014-03-22 – 2014-03-23 (×2): 3 [IU] via SUBCUTANEOUS
  Administered 2014-03-23 – 2014-03-27 (×5): 2 [IU] via SUBCUTANEOUS
  Administered 2014-03-28: 3 [IU] via SUBCUTANEOUS

## 2014-03-13 MED ORDER — DONEPEZIL HCL 23 MG PO TABS
23.0000 mg | ORAL_TABLET | Freq: Every day | ORAL | Status: DC
  Administered 2014-03-13 – 2014-03-28 (×16): 23 mg via ORAL
  Filled 2014-03-13 (×17): qty 1

## 2014-03-13 MED ORDER — ENOXAPARIN SODIUM 40 MG/0.4ML ~~LOC~~ SOLN
40.0000 mg | SUBCUTANEOUS | Status: DC
  Administered 2014-03-14 – 2014-03-29 (×16): 40 mg via SUBCUTANEOUS
  Filled 2014-03-13 (×19): qty 0.4

## 2014-03-13 MED ORDER — PROCHLORPERAZINE 25 MG RE SUPP: 12.5000 mg | Freq: Four times a day (QID) | RECTAL | Status: DC | PRN

## 2014-03-13 MED ORDER — TAMSULOSIN HCL 0.4 MG PO CAPS
0.4000 mg | ORAL_CAPSULE | Freq: Every day | ORAL | Status: DC
  Administered 2014-03-13 – 2014-03-26 (×14): 0.4 mg via ORAL
  Filled 2014-03-13 (×15): qty 1

## 2014-03-13 MED ORDER — PROCHLORPERAZINE MALEATE 5 MG PO TABS
5.0000 mg | ORAL_TABLET | Freq: Four times a day (QID) | ORAL | Status: DC | PRN
  Filled 2014-03-13: qty 2

## 2014-03-13 MED ORDER — FLEET ENEMA 7-19 GM/118ML RE ENEM: 1.0000 | ENEMA | Freq: Once | RECTAL | Status: AC | PRN

## 2014-03-13 MED ORDER — BETHANECHOL CHLORIDE 25 MG PO TABS
25.0000 mg | ORAL_TABLET | Freq: Four times a day (QID) | ORAL | Status: DC
  Administered 2014-03-13: 25 mg via ORAL
  Filled 2014-03-13 (×6): qty 1

## 2014-03-13 MED ORDER — DOCUSATE SODIUM 100 MG PO CAPS
100.0000 mg | ORAL_CAPSULE | Freq: Two times a day (BID) | ORAL | Status: DC
  Administered 2014-03-13 – 2014-03-25 (×24): 100 mg via ORAL
  Filled 2014-03-13 (×29): qty 1

## 2014-03-13 MED ORDER — TRAZODONE HCL 50 MG PO TABS
25.0000 mg | ORAL_TABLET | Freq: Every evening | ORAL | Status: DC | PRN
  Administered 2014-03-13 – 2014-03-17 (×4): 50 mg via ORAL
  Filled 2014-03-13 (×4): qty 1

## 2014-03-13 MED ORDER — LINAGLIPTIN 5 MG PO TABS
5.0000 mg | ORAL_TABLET | Freq: Every day | ORAL | Status: DC
Start: 2014-03-14 — End: 2014-03-29
  Administered 2014-03-14 – 2014-03-29 (×16): 5 mg via ORAL
  Filled 2014-03-13 (×19): qty 1

## 2014-03-13 MED ORDER — METHOCARBAMOL 500 MG PO TABS: 500.0000 mg | ORAL_TABLET | Freq: Four times a day (QID) | ORAL | Status: DC | PRN

## 2014-03-13 MED ORDER — METFORMIN HCL 500 MG PO TABS
500.0000 mg | ORAL_TABLET | Freq: Two times a day (BID) | ORAL | Status: DC
  Administered 2014-03-14 – 2014-03-29 (×31): 500 mg via ORAL
  Filled 2014-03-13 (×35): qty 1

## 2014-03-13 MED ORDER — PROCHLORPERAZINE EDISYLATE 5 MG/ML IJ SOLN: 5.0000 mg | Freq: Four times a day (QID) | INTRAMUSCULAR | Status: DC | PRN

## 2014-03-13 MED ORDER — BISACODYL 10 MG RE SUPP: 10.0000 mg | Freq: Every day | RECTAL | Status: DC | PRN

## 2014-03-13 MED ORDER — STUDY - INVESTIGATIONAL DRUG SIMPLE RECORD
100.0000 mg | Freq: Every day | Status: DC
  Administered 2014-03-14 – 2014-03-29 (×14): 100 mg via ORAL
  Filled 2014-03-13 (×13): qty 100

## 2014-03-13 MED ORDER — ALPRAZOLAM 0.25 MG PO TABS
0.2500 mg | ORAL_TABLET | Freq: Three times a day (TID) | ORAL | Status: DC | PRN
  Administered 2014-03-17: 0.25 mg via ORAL
  Filled 2014-03-13: qty 1

## 2014-03-13 MED ORDER — GLYBURIDE 5 MG PO TABS
5.0000 mg | ORAL_TABLET | Freq: Two times a day (BID) | ORAL | Status: DC
  Administered 2014-03-14 – 2014-03-25 (×24): 5 mg via ORAL
  Filled 2014-03-13 (×27): qty 1

## 2014-03-13 MED ORDER — POLYETHYLENE GLYCOL 3350 17 G PO PACK
17.0000 g | PACK | Freq: Every day | ORAL | Status: DC
  Administered 2014-03-14 – 2014-03-29 (×15): 17 g via ORAL
  Filled 2014-03-13 (×17): qty 1

## 2014-03-13 MED ORDER — CETYLPYRIDINIUM CHLORIDE 0.05 % MT LIQD
7.0000 mL | Freq: Two times a day (BID) | OROMUCOSAL | Status: DC
  Administered 2014-03-13 – 2014-03-28 (×30): 7 mL via OROMUCOSAL

## 2014-03-13 MED ORDER — TRAMADOL HCL 50 MG PO TABS
100.0000 mg | ORAL_TABLET | Freq: Four times a day (QID) | ORAL | Status: DC
  Administered 2014-03-13 – 2014-03-27 (×54): 100 mg via ORAL
  Filled 2014-03-13 (×55): qty 2

## 2014-03-13 MED ORDER — ACETAMINOPHEN 325 MG PO TABS
325.0000 mg | ORAL_TABLET | ORAL | Status: DC | PRN
  Administered 2014-03-14 – 2014-03-18 (×2): 650 mg via ORAL
  Filled 2014-03-13 (×2): qty 2

## 2014-03-13 MED ORDER — NAPROXEN 250 MG PO TABS
250.0000 mg | ORAL_TABLET | Freq: Two times a day (BID) | ORAL | Status: DC
  Administered 2014-03-14 – 2014-03-27 (×27): 250 mg via ORAL
  Filled 2014-03-13 (×31): qty 1
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
## 2014-03-13 NOTE — Progress Notes (Signed)
I have insurance approval and will be admitting pt to inpt rehab today. Pt is aware and in agreement. [phone]
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
## 2014-03-13 NOTE — Anesthesia Postprocedure Evaluation (Signed)
  Anesthesia Post-op Note  Patient: Denise [name]  Procedure(s) Performed: * No procedures listed *  Patient Location: ICU  Anesthesia Type:Epidural  Level of Consciousness: awake and confused  Airway and Oxygen Therapy: Patient Spontanous Breathing  Post-op Pain: mild  Post-op Assessment: Post-op Vital signs reviewed, Patient's Cardiovascular Status Stable, Respiratory Function Stable, Patent Airway and Pain level controlled  Post-op Vital Signs: Reviewed and stable  Last Vitals:  Filed Vitals:   03/13/14 0608  BP: 155/62  Pulse: 96  Temp: 36.6 C  Resp: 16    Complications: No apparent anesthesia complications
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
## 2014-03-13 NOTE — Clinical Social Work Note (Signed)
Clinical Social Worker continuing to follow patient and family for support and discharge planning needs.  Patient has been accepted to inpatient rehab with insurance approval for admission today.  CSW attempted to visit with patient and patient husband at bedside, however patient being cleaned up and patient husband has already left for the day.  No further social work needs identified.  Clinical Social Worker will sign off for now as social work intervention is no longer needed. Please consult us again if new need arises.  Macario GoldsJesse Emanuelle Bastos, Kentucky[place]CSW [phone]

## 2014-03-13 NOTE — Progress Notes (Signed)
Inpatient Diabetes Program Recommendations  AACE/ADA: New Consensus Statement on Inpatient Glycemic Control (2013)  Target Ranges:  Prepandial:   less than 140 mg/dL      Peak postprandial:   less than 180 mg/dL (1-2 hours)      Critically ill patients:  140 - 180 mg/dL   Reason for Assessment:  Results for Denise [name], Denise [name] (MRN [id number]) as of 03/13/2014 12:41  Ref. Range 03/12/2014 12:19 03/12/2014 17:54 03/13/2014 06:27 03/13/2014 06:44 03/13/2014 11:15  Glucose-Capillary Latest Range: 70-99 mg/dL 811183 (H) 914142 (H) 63 (L) 77 158 (H)   Note hypoglycemia this AM.  Please consider reducing Lantus to 38 units q HS.  Paged PA to discuss.  Thanks, Beryl MeagerJenny Lacretia Tindall, RN, BC-ADM Inpatient Diabetes Coordinator Pager [phone]
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
## 2014-03-13 NOTE — Progress Notes (Signed)
Hypoglycemic Event  CBG: 63  Treatment: 4oz of orange juice  Symptoms: none  Follow-up CBG: Time:0627 CBG Result:77  Possible Reasons for Event: Patient appears to have lower blood sugar in morning, may need late night snack.  Comments/MD notified:fFollowed protocol    Wendall MolaFoushee, Archita Lomeli S  Remember to initiate Hypoglycemia Order Set & complete

## 2014-03-14 ENCOUNTER — Inpatient Hospital Stay (HOSPITAL_COMMUNITY): Payer: Medicare Other

## 2014-03-14 ENCOUNTER — Inpatient Hospital Stay (HOSPITAL_COMMUNITY): Payer: Medicare Other | Admitting: Speech Pathology

## 2014-03-14 ENCOUNTER — Inpatient Hospital Stay (HOSPITAL_COMMUNITY): Payer: Medicare Other | Admitting: Occupational Therapy

## 2014-03-14 LAB — GLUCOSE, CAPILLARY
GLUCOSE-CAPILLARY: 136 mg/dL — AB (ref 70–99)
Glucose-Capillary: 82 mg/dL (ref 70–99)
Glucose-Capillary: 193 mg/dL — ABNORMAL HIGH (ref 70–99)
Glucose-Capillary: 186 mg/dL — ABNORMAL HIGH (ref 70–99)

## 2014-03-14 LAB — COMPREHENSIVE METABOLIC PANEL
ALT: 27 U/L (ref 0–35)
AST: 26 U/L (ref 0–37)
Albumin: 2.6 g/dL — ABNORMAL LOW (ref 3.5–5.2)
Alkaline Phosphatase: 172 U/L — ABNORMAL HIGH (ref 39–117)
Anion gap: 10 (ref 5–15)
BUN: 16 mg/dL (ref 6–23)
CO2: 25 mmol/L (ref 19–32)
Calcium: 8.9 mg/dL (ref 8.4–10.5)
Chloride: 103 mmol/L (ref 96–112)
Creatinine, Ser: 0.58 mg/dL (ref 0.50–1.10)
GFR calc Af Amer: >90 mL/min (ref >90)
GFR, EST NON AFRICAN AMERICAN: 89 mL/min — AB (ref >90)
GLUCOSE: 92 mg/dL (ref 70–99)
POTASSIUM: 4.5 mmol/L (ref 3.5–5.1)
SODIUM: 138 mmol/L (ref 135–145)
Total Bilirubin: 0.7 mg/dL (ref 0.3–1.2)
Total Protein: 5.7 g/dL — ABNORMAL LOW (ref 6.0–8.3)

## 2014-03-14 LAB — CBC WITH DIFFERENTIAL/PLATELET
Basophils Absolute: 0 10*3/uL (ref 0.0–0.1)
Basophils Relative: 0 % (ref 0–1)
EOS PCT: 4 % (ref 0–5)
Eosinophils Absolute: 0.5 10*3/uL (ref 0.0–0.7)
HCT: 39.2 % (ref 36.0–46.0)
Hemoglobin: 13.1 g/dL (ref 12.0–15.0)
LYMPHS ABS: 2.1 10*3/uL (ref 0.7–4.0)
LYMPHS PCT: 16 % (ref 12–46)
MCH: 32 pg (ref 26.0–34.0)
MCHC: 33.4 g/dL (ref 30.0–36.0)
MCV: 95.8 fL (ref 78.0–100.0)
MONO ABS: 1.2 10*3/uL — AB (ref 0.1–1.0)
MONOS PCT: 9 % (ref 3–12)
Neutro Abs: 9.4 10*3/uL — ABNORMAL HIGH (ref 1.7–7.7)
Neutrophils Relative %: 71 % (ref 43–77)
Platelets: 319 10*3/uL (ref 150–400)
RBC: 4.09 MIL/uL (ref 3.87–5.11)
RDW: 13.6 % (ref 11.5–15.5)
WBC: 13.3 10*3/uL — AB (ref 4.0–10.5)

## 2014-03-14 MED ORDER — BETHANECHOL CHLORIDE 10 MG PO TABS
10.0000 mg | ORAL_TABLET | Freq: Four times a day (QID) | ORAL | Status: DC
  Administered 2014-03-14 – 2014-03-15 (×3): 10 mg via ORAL
  Filled 2014-03-14 (×7): qty 1

## 2014-03-14 MED ORDER — FLUCONAZOLE 200 MG PO TABS
200.0000 mg | ORAL_TABLET | Freq: Once | ORAL | Status: AC
  Administered 2014-03-14: 200 mg via ORAL
  Filled 2014-03-14: qty 1

## 2014-03-14 MED ORDER — FLAVOXATE HCL 100 MG PO TABS
100.0000 mg | ORAL_TABLET | Freq: Three times a day (TID) | ORAL | Status: DC | PRN
  Filled 2014-03-14: qty 1
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
## 2014-03-14 NOTE — Progress Notes (Signed)
Patient information reviewed and entered into eRehab system by Azalea Cedar, RN, CRRN, PPS Coordinator.  Information including medical coding and functional independence measure will be reviewed and updated through discharge.     Per nursing patient was given "Data Collection Information Summary for Patients in Inpatient Rehabilitation Facilities with attached "Privacy Act Statement-Health Care Records" upon admission.  

## 2014-03-14 NOTE — Progress Notes (Deleted)
NIF greater than -60. The highest the instrument measures is -60. Really good effort.

## 2014-03-14 NOTE — Plan of Care (Signed)
Problem: RH BLADDER ELIMINATION Goal: RH STG MANAGE BLADDER WITH ASSISTANCE STG Manage Bladder With mod Assistance  Outcome: Not Progressing Incontinent multiple times
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
## 2014-03-14 NOTE — Plan of Care (Signed)
Problem: RH BOWEL ELIMINATION Goal: RH STG MANAGE BOWEL WITH ASSISTANCE STG Manage Bowel with mod Assistance.  New admit Goal: RH STG MANAGE BOWEL W/MEDICATION W/ASSISTANCE STG Manage Bowel with Medication with min Assistance.  New admit  Problem: RH BLADDER ELIMINATION Goal: RH STG MANAGE BLADDER WITH ASSISTANCE STG Manage Bladder With mod Assistance  New admit  Problem: RH SKIN INTEGRITY Goal: RH STG SKIN FREE OF INFECTION/BREAKDOWN Patients skin will remain free from further breakdown or infection with mod assist  New admit Goal: RH STG MAINTAIN SKIN INTEGRITY WITH ASSISTANCE STG Maintain Skin Integrity With mod Assistance.  New admit  Problem: RH SAFETY Goal: RH STG ADHERE TO SAFETY PRECAUTIONS W/ASSISTANCE/DEVICE STG Adhere to Safety Precautions With min Assistance/Device.  New admit  Problem: RH PAIN MANAGEMENT Goal: RH STG PAIN MANAGED AT OR BELOW PT'S PAIN GOAL < 4  New admit
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
## 2014-03-15 ENCOUNTER — Inpatient Hospital Stay (HOSPITAL_COMMUNITY): Payer: Medicare Other

## 2014-03-15 ENCOUNTER — Inpatient Hospital Stay (HOSPITAL_COMMUNITY): Payer: Medicare Other | Admitting: Speech Pathology

## 2014-03-15 ENCOUNTER — Inpatient Hospital Stay (HOSPITAL_COMMUNITY): Payer: Medicare Other | Admitting: Occupational Therapy

## 2014-03-15 LAB — GLUCOSE, CAPILLARY
GLUCOSE-CAPILLARY: 87 mg/dL (ref 70–99)
GLUCOSE-CAPILLARY: 258 mg/dL — AB (ref 70–99)
GLUCOSE-CAPILLARY: 160 mg/dL — AB (ref 70–99)
Glucose-Capillary: 102 mg/dL — ABNORMAL HIGH (ref 70–99)

## 2014-03-15 MED ORDER — HYDROCORTISONE 2.5 % RE CREA
TOPICAL_CREAM | Freq: Two times a day (BID) | RECTAL | Status: DC
  Filled 2014-03-15: qty 28.35

## 2014-03-15 MED ORDER — CEPHALEXIN 250 MG PO CAPS
250.0000 mg | ORAL_CAPSULE | Freq: Three times a day (TID) | ORAL | Status: AC
  Administered 2014-03-15 – 2014-03-22 (×21): 250 mg via ORAL
  Filled 2014-03-15 (×21): qty 1

## 2014-03-15 MED ORDER — SENNA 8.6 MG PO TABS
1.0000 | ORAL_TABLET | Freq: Every day | ORAL | Status: DC
  Administered 2014-03-15 – 2014-03-24 (×10): 8.6 mg via ORAL
  Filled 2014-03-15 (×11): qty 1

## 2014-03-15 MED ORDER — HYDROCORTISONE 2.5 % RE CREA
TOPICAL_CREAM | Freq: Two times a day (BID) | RECTAL | Status: DC | PRN
  Administered 2014-03-16 (×2): via RECTAL
  Filled 2014-03-15: qty 28.35

## 2014-03-15 MED ORDER — HYDROCORTISONE ACETATE 25 MG RE SUPP
25.0000 mg | Freq: Two times a day (BID) | RECTAL | Status: AC
  Administered 2014-03-15 – 2014-03-18 (×5): 25 mg via RECTAL
  Filled 2014-03-15 (×6): qty 1

## 2014-03-15 NOTE — Progress Notes (Signed)
Physical Therapy Session Note  Patient Details  Name: Denise [name] MRN: [id number] Date of Birth: 07/22/1940  Today's Date: 03/15/2014 PT Individual Time: 1400-1500 PT Individual Time Calculation (min): 60 min   Short Term Goals: Week 1:  PT Short Term Goal 1 (Week 1): Pt will be able to perform all bed mobility with mod A of one person. PT Short Term Goal 2 (Week 1): Pt will be perform tranfers bed<>chair with mod cues and assist from one person. PT Short Term Goal 3 (Week 1): Pt will ambulate 25' in a controlled environment with LRAD and mod A from one person. PT Short Term Goal 4 (Week 1): Pt will stand with B UE support for >3 mintues during functional task and mod A from one person.  Skilled Therapeutic Interventions/Progress Updates:  1:1. Pt received seated in w/c, ready and willing to participate in physical therapy. Session focused on w/c propulsion, functional transfers including sit<>stand, squat pivot transfers and transfers to/from toilet. Pt was transported to therapy gym for time management and performed stand pivot t/f w/c>therapy mat with modA to stand, student PT facilitating anterior weight shift and with hand placement under bilateral arms. Pt performed sit<>stand from elevated surface with minA and use of RW and remained standing x2 minutes with steady assist. Pt stand pivot transferred EOM>w/c with minA and transported back to room to use bathroom. Pt transported to/from elevated commode with modA x2 persons and use of grab bar. Pt propelled w/c 150'x2 with use of BUE and minA for occasional steering and safety. W/c propulsion was performed at a very slow but consistent speed.  Pt required significantly increased time for initiation of tranfers due to increased pain, anxiousness of falling and memory deficits affecting technique. Pt returned to room and left seated in w/c, husband by side, all needs within reach and quick release belt in place.  Therapy  Documentation Precautions:  Precautions Precautions: Fall Precaution Comments: sling for R UE Required Braces or Orthoses: Sling Restrictions Weight Bearing Restrictions: No RUE Weight Bearing: Weight bearing as tolerated RLE Weight Bearing: Weight bearing as tolerated LLE Weight Bearing: Weight bearing as tolerated  See FIM for current functional status  Therapy/Group: Individual Therapy  Everlene Otherck, Cydnee [place] 03/15/2014, 4:36 PM
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
## 2014-03-15 NOTE — Progress Notes (Signed)
Speech Language Pathology Daily Session Note  Patient Details  Name: Denise [name] MRN: [id number] Date of Birth: 1940/10/19  Today's Date: 03/15/2014 SLP Individual Time: 0810-0840 SLP Individual Time Calculation (min): 30 min  Short Term Goals: Week 1: SLP Short Term Goal 1 (Week 1): short term goals=long term goals  Skilled Therapeutic Interventions: Skilled therapy session focused on cogntive goals. Upon arrival, patient was alert, sitting upright in wheelchair, and preparing to eat breakfast. Student facilitated session by providing Min A multimodal cues for problem-solving with tray set-up and by creating external aids for recall of basic biographical information. Patient initially required Max A multimodal cues for use of external aid, which faded to Mod A at end of session. Student further facilitated session by providing Mod A multimodal cues for intellectual awareness of cognitive deficits and for sustained attention in a quiet environment for ~30-60 seconds. Patient handed off to OT. Continue with current plan of care.    FIM:  Comprehension Comprehension Mode: Auditory Comprehension: 5-Understands basic 90% of the time/requires cueing < 10% of the time Expression Expression Mode: Verbal Expression: 5-Expresses basic 90% of the time/requires cueing < 10% of the time. Social Interaction Social Interaction: 4-Interacts appropriately 75 - 89% of the time - Needs redirection for appropriate language or to initiate interaction. Problem Solving Problem Solving: 2-Solves basic 25 - 49% of the time - needs direction more than half the time to initiate, plan or complete simple activities Memory Memory: 2-Recognizes or recalls 25 - 49% of the time/requires cueing 51 - 75% of the time FIM - Eating Eating Activity: 5: Set-up assist for open containers  Pain Pain Assessment Pain Assessment: No/denies pain  Therapy/Group: Individual Therapy  Tacey RuizKatherine Tyus Kallam 03/15/2014, 8:53  AM
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
## 2014-03-16 ENCOUNTER — Inpatient Hospital Stay (HOSPITAL_COMMUNITY): Payer: Medicare Other | Admitting: Occupational Therapy

## 2014-03-16 ENCOUNTER — Encounter (HOSPITAL_COMMUNITY): Payer: Medicare Other | Admitting: Occupational Therapy

## 2014-03-16 DIAGNOSIS — S32511D Fracture of superior rim of right pubis, subsequent encounter for fracture with routine healing: Secondary | ICD-10-CM

## 2014-03-16 LAB — GLUCOSE, CAPILLARY
GLUCOSE-CAPILLARY: 96 mg/dL (ref 70–99)
GLUCOSE-CAPILLARY: 198 mg/dL — AB (ref 70–99)
GLUCOSE-CAPILLARY: 180 mg/dL — AB (ref 70–99)
GLUCOSE-CAPILLARY: 131 mg/dL — AB (ref 70–99)

## 2014-03-16 LAB — URINE CULTURE

## 2014-03-16 NOTE — Progress Notes (Signed)
Occupational Therapy Session Note  Patient Details  Name: Denise [name] MRN: [id number] Date of Birth: 09/25/1940  Today's Date: 03/16/2014 OT Individual Time: 1300-1400 OT Individual Time Calculation (min): 60 min    Short Term Goals: Week 1:  OT Short Term Goal 1 (Week 1): Pt will tranfer to BSC/ toilet with mod A  OT Short Term Goal 2 (Week 1): Pt will perform sit to stand with mod A in prep for clothing management in standin OT Short Term Goal 3 (Week 1): Pt will don shirt with min A in unsupported sitting  OT Short Term Goal 4 (Week 1): Pt will perform bed mobility in prep for bathing and dressing tasks out of bed with mod A   Skilled Therapeutic Interventions/Progress Updates:  Upon entering the room, pt seated in wheelchair with 5/10 c/o pain in R shoulder. Her husband is present in the room. Pt oriented to self,location, and situation. Pt requesting to "wash up" and put clothing on. Declining shower this session and stating, "I just don't think I can handle that today." Pt performed bathing and dressing at sink with STS of Mod A. Pt resistive towards standing and requiring mod verbal cues for technique and placement. Pt also needing max verbal cues throughout session to maintain NWB status of R UE. OT placing her arm into shoulder sling as a reminder and pt still attempting to push up from chair and hold onto sink with R UE. Toileting this session with 2 helpers for safe transfer, hygiene, and clothing management. Her husband present to assist with treatment session and hands on training. Pt seated in wheelchair with QRB donned and call bell within reach upon exiting the room.   Therapy Documentatio Precautions:  Precautions Precautions: Fall Precaution Comments: sling for R UE Required Braces or Orthoses: Sling Restrictions Weight Bearing Restrictions: No RUE Weight Bearing: Weight bearing as tolerated RLE Weight Bearing: Weight bearing as tolerated LLE Weight Bearing: Weight  bearing as tolerated ADL: ADL ADL Comments: see FIm  See FIM for current functional status  Therapy/Group: Individual Therapy  Lowella Gripittman, Regene Mccarthy L 03/16/2014, 3:26 PM
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
## 2014-03-17 ENCOUNTER — Inpatient Hospital Stay (HOSPITAL_COMMUNITY): Payer: Medicare Other | Admitting: Occupational Therapy

## 2014-03-17 ENCOUNTER — Inpatient Hospital Stay (HOSPITAL_COMMUNITY): Payer: Medicare Other | Admitting: *Deleted

## 2014-03-17 ENCOUNTER — Encounter (HOSPITAL_COMMUNITY): Payer: Medicare Other | Admitting: Occupational Therapy

## 2014-03-17 DIAGNOSIS — N3000 Acute cystitis without hematuria: Secondary | ICD-10-CM

## 2014-03-17 LAB — GLUCOSE, CAPILLARY
GLUCOSE-CAPILLARY: 91 mg/dL (ref 70–99)
GLUCOSE-CAPILLARY: 116 mg/dL — AB (ref 70–99)
Glucose-Capillary: 191 mg/dL — ABNORMAL HIGH (ref 70–99)
Glucose-Capillary: 149 mg/dL — ABNORMAL HIGH (ref 70–99)

## 2014-03-17 NOTE — Progress Notes (Signed)
Physical Therapy Session Note  Patient Details  Name: Denise [name] MRN: [id number] Date of Birth: [birth year]  Today's Date: 03/17/2014 PT Individual Time: 1405-1505 PT Individual Time Calculation (min): 60 min   Short Term Goals: Week 1:  PT Short Term Goal 1 (Week 1): Pt will be able to perform all bed mobility with mod A of one person. PT Short Term Goal 2 (Week 1): Pt will be perform tranfers bed<>chair with mod cues and assist from one person. PT Short Term Goal 3 (Week 1): Pt will ambulate 25' in a controlled environment with LRAD and mod A from one person. PT Short Term Goal 4 (Week 1): Pt will stand with B UE support for >3 mintues during functional task and mod A from one person.  Skilled Therapeutic Interventions/Progress Updates:  Tx focused on functional mobility training, gait/stairs, and therex for strengthening. Pt required multiple rest breaks throughout for recovery due to pain and fatigue. Pt continues to be limited by pain and fear of falling. Pt with sling donned at start of tx as reminder not to use RUE and increase pain. Eventually doffed for use with RW.  Ice provided for shoulder following tx and RN made aware.   Pt performed multiple sit<>stand transfers throughout tx to RW and stairs with up to +2 A for lifting after multiple attempts and encouragement. Also performed multiple stand-step transfers mat<>WC and WC<>toilet with +2 Mod assist, HHA to complete turn. Pt had better performance from higher surfaces.  Pt given consistent cues for posture and reducing RUE reliance during functional tasks for pain management.   Attempted stair training. Pt able to lift LLE onto 4" step x2, but unable to lift bodyweight up to complete single step after multiple attempts. Will continue to address this area.   Gait training with RW 2x20' with Mod A overall for steadying and cues for gait quality. After practice, pt able to take full step rather than shuffle steps during each  stride. Gait limited by pain at pelvis and R shoulder.   Pt instructed in seated therex for strengthening including marching and LAQ in available range, 2x10 with cues and encouragement. Pt able to stand 3x2 min for activity tolerance with 1/2 UE reliance.   Pt assisted to bed to rest at end of tx as she had been up all day, Max A sit>supine. Positioned with pillows and ice, encouraged OOB for dinner.       Therapy Documentation Precautions:  Precautions Precautions: Fall Precaution Comments: sling for R UE Required Braces or Orthoses: Sling Restrictions Weight Bearing Restrictions: No RUE Weight Bearing: Weight bearing as tolerated RLE Weight Bearing: Weight bearing as tolerated LLE Weight Bearing: Weight bearing as tolerated   Pain: 7/10 R shoulder pain, Ice provided and RN made aware      Locomotion : Ambulation Ambulation/Gait Assistance: 3: Mod assist   See FIM for current functional status  Therapy/Group: Individual Therapy  Clydene Lamingole Toniyah Dilmore, PT, DPT  03/17/2014, 3:14 PM
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
## 2014-03-17 NOTE — Progress Notes (Signed)
Occupational Therapy Session Note  Patient Details  Name: Laqueta CarinaBarbara M Vasseur MRN: [id number] Date of Birth: 02/08/1940  Today's Date: 03/17/2014 OT Individual Time: 0830-0930 OT Individual Time Calculation (min): 60 min    Short Term Goals: Week 1:  OT Short Term Goal 1 (Week 1): Pt will tranfer to BSC/ toilet with mod A  OT Short Term Goal 2 (Week 1): Pt will perform sit to stand with mod A in prep for clothing management in standin OT Short Term Goal 3 (Week 1): Pt will don shirt with min A in unsupported sitting  OT Short Term Goal 4 (Week 1): Pt will perform bed mobility in prep for bathing and dressing tasks out of bed with mod A   Skilled Therapeutic Interventions/Progress Updates:  Pt this am participates in self care as follows: supine>sit EOB with mod a and max vcs to stay on task for completion of transfer and safety. Sit>stand pivot transfer >BSC mod a; total for brief management; pt with incontinent episode and still voids in [facility] after transfer. Transfer from [facility]>wc for shower task mod a  With STS task and mod vcs; pt requires continued vcs for staying on task for safety. Wc>shower chair transfer mod a and use of grab bars. Mod a for bathing while sitting shower chair; mod a for sit to stand  for peri care. 2 person assist for brief management/sit to stand  for  hiking of LE clothing at sink level. Verbal and tactile cues for dressing RUE first due to limiting pain. S/u a for oral care /grooming. Pt requires assistance for cutting of food for intake and opening items. Throughout task pt requires constant verbal  to redirect back to treatment for safe and functional completion of task. Pt left with call bell in reach and safety release belt donned     Therapy Documentation Precautions:  Precautions Precautions: Fall Precaution Comments: sling for R UE Required Braces or Orthoses: Sling Restrictions Weight Bearing Restrictions: No RUE Weight Bearing: Weight bearing as tolerated RLE  Weight Bearing: Weight bearing as tolerated LLE Weight Bearing: Weight bearing as tolerated   Pain: Pt completes of pain 5/10 RUE; offered RBs throughout session and verbal cues to use LE during task completion. Sling donned to RUE for increase in comfort and support  ADL: ADL ADL Comments: see FIm  See FIM for current functional status  Therapy/Group: Individual Therapy  Laurence ComptonFord, Rosene Pilling S 03/17/2014, 9:31 AM

## 2014-03-17 NOTE — Progress Notes (Signed)
Occupational Therapy Session Note  Patient Details  Name: Denise [name] MRN: [id number] Date of Birth: 10-03-1940  Today's Date: 03/17/2014 OT Individual Time: 1300-1400 OT Individual Time Calculation (min): 60 min    Short Term Goals: Week 2:     Skilled Therapeutic Interventions/Progress Updates:  Pt in room with spouse upon arrival for session. Pt agreeable to afternoon session. Provided education with pt/spouse on therapy goals to improve balance and completion of sit>stand task for increase in safety during functional mobility for safe return to home. Pt participates in intervals of sit>stand task at mat level with mod a (inconsistent) with education and follow through when verbal cues offered for safe and functional task. Dynamic standing balance fair while reaching laterally and forward with mod a; inability to stand upright without support noted through out session. Pt completes side steps with max time and RBs allowed to increase safety and decrease fall risk. Spouse there to encourage pt in session and patients continues to state pain and discomfort when completing movement task in RUE    Therapy Documentation Precautions:  Precautions Precautions: Fall Precaution Comments: sling for R UE Required Braces or Orthoses: Sling Restrictions Weight Bearing Restrictions: No RUE Weight Bearing: Weight bearing as tolerated RLE Weight Bearing: Weight bearing as tolerated LLE Weight Bearing: Weight bearing as tolerated       Therapy/Group: Individual Therapy  Laurence ComptonFord, Sabre Leonetti S 03/17/2014, 2:59 PM

## 2014-03-18 ENCOUNTER — Inpatient Hospital Stay (HOSPITAL_COMMUNITY): Payer: Medicare Other | Admitting: Speech Pathology

## 2014-03-18 ENCOUNTER — Inpatient Hospital Stay (HOSPITAL_COMMUNITY): Payer: Medicare Other | Admitting: *Deleted

## 2014-03-18 ENCOUNTER — Inpatient Hospital Stay (HOSPITAL_COMMUNITY): Payer: Medicare Other | Admitting: Physical Therapy

## 2014-03-18 LAB — GLUCOSE, CAPILLARY
GLUCOSE-CAPILLARY: 70 mg/dL (ref 70–99)
Glucose-Capillary: 167 mg/dL — ABNORMAL HIGH (ref 70–99)
Glucose-Capillary: 99 mg/dL (ref 70–99)
Glucose-Capillary: 94 mg/dL (ref 70–99)

## 2014-03-18 MED ORDER — OXYBUTYNIN CHLORIDE 5 MG PO TABS
2.5000 mg | ORAL_TABLET | Freq: Two times a day (BID) | ORAL | Status: DC
  Administered 2014-03-18 – 2014-03-26 (×18): 2.5 mg via ORAL
  Filled 2014-03-18 (×21): qty 0.5
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
## 2014-03-18 NOTE — Progress Notes (Signed)
Occupational Therapy Session Note  Patient Details  Name: Denise [name] MRN: [id number] Date of Birth: [birth year]  Today's Date: 03/18/2014 OT Individual Time: 0900-1000 OT Individual Time Calculation (min): 60 min    Short Term Goals: Week 1:  OT Short Term Goal 1 (Week 1): Pt will tranfer to BSC/ toilet with mod A  OT Short Term Goal 2 (Week 1): Pt will perform sit to stand with mod A in prep for clothing management in standin OT Short Term Goal 3 (Week 1): Pt will don shirt with min A in unsupported sitting  OT Short Term Goal 4 (Week 1): Pt will perform bed mobility in prep for bathing and dressing tasks out of bed with mod A   Skilled Therapeutic Interventions/Progress Updates:    Pt resting in w/c upon arrival and agreeable to therapy.  Pt asked why she was in hospital and if her husband Gabriel Rung(Joe) was OK X 10 times (approx) during session.  When not sitting at sink and able to see sign on wall, pt was redirected to sign.  Pt declined shower this morning and completed BADLs with sit<>stand from w/c at sink.  Pt required mod A for sit<>stand at sink but required assistance to pull up pants.  Pt required max A for LB dressing.  Pt c/o back and Rt shoulder throughout session.  Pain meds admin prior to therapy session.  Pt requires extra time to complete all tasks with multiple rest breaks. Focus on activity tolerance, sit<>stand, standing balance, BADL retraining, orientation, and safety awareness.  Therapy Documentation Precautions:  Precautions Precautions: Fall Precaution Comments: sling for R UE Required Braces or Orthoses: Sling Restrictions Weight Bearing Restrictions: No RUE Weight Bearing: Weight bearing as tolerated RLE Weight Bearing: Weight bearing as tolerated LLE Weight Bearing: Weight bearing as tolerated   Pain: Pain Assessment Pain Assessment: 0-10 Pain Score: 5  Pain Type: Acute pain Pain Location: Hip Pain Orientation: Right Pain Descriptors / Indicators:  Aching Pain Intervention(s): RN aware See FIM for current functional status  Therapy/Group: Individual Therapy  Rich BraveLanier, Gladie Gravette Chappell 03/18/2014, 10:01 AM
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
## 2014-03-18 NOTE — Progress Notes (Signed)
Patient continues to have frequency with minimal PVR. Has been treated for in process of completing Keflex for UTI. Will trial low dose ditropan for hyperactive bladder.

## 2014-03-18 NOTE — Progress Notes (Signed)
Speech Language Pathology Session Note And Discharge Summary  Patient Details  Name: Denise [name] MRN: [id number] Date of Birth: [birth year]  Today's Date: 03/18/2014 SLP Individual Time: 1430-1500 SLP Individual Time Calculation (min): 30 min   Skilled Therapeutic Interventions: Skilled treatment session focused on cognitive goals. Upon arrival, patient was seated upright in wheelchair with quick-release belt in place, with spouse present, and agreeable to participate in therapy. Student facilitated session by providing Mod A multimodal cues for sustained attention to functional task of utilizing a menu for meal-planning task. Patient also required Min A multimodal cues for utilization of external aid relating to meal ordering and for orienting to date. Per spouse, patient is at her baseline level for cognitive functioning, therefore, will discharge patient from SLP caseload; discussed this with patient and spouse and they verbalized agreement. Spouse reported use of adequate and appropriate memory strategies and supports at home. Patient left seated in wheelchair with quick-release belt in place, all needs within reach, and spouse present in room. Continue with current plan of care.  Patient has met 2 of 2 long term goals.  Patient to discharge at overall Mod level.  Reasons goals not met: n/a   Clinical Impression/Discharge Summary: Patient has met 2 out of 2 LTG's this reporting period due to decreased confusion and increased sustained attention in quiet environments and utilization of external aids to assist in recall. At this time, patient is at her cognitive baseline per husband, therefore, patient will be discharged from skilled SLP intervention; no follow-up is warranted at this time. Care Partner:  Caregiver Able to Provide Assistance: Yes  Type of Caregiver Assistance: Physical;Cognitive  Recommendation:  24 hour supervision/assistance;None      Equipment: none   Reasons for  discharge: Treatment goals met;Other (comment) (patient at baseline cognitive status)   Patient/Family Agrees with Progress Made and Goals Achieved: Yes   See FIM for current functional status  Servando Snare 03/18/2014, 3:32 PM

## 2014-03-19 ENCOUNTER — Inpatient Hospital Stay (HOSPITAL_COMMUNITY): Payer: Medicare Other

## 2014-03-19 ENCOUNTER — Inpatient Hospital Stay (HOSPITAL_COMMUNITY): Payer: Medicare Other | Admitting: Physical Therapy

## 2014-03-19 LAB — GLUCOSE, CAPILLARY
GLUCOSE-CAPILLARY: 174 mg/dL — AB (ref 70–99)
Glucose-Capillary: 80 mg/dL (ref 70–99)
Glucose-Capillary: 157 mg/dL — ABNORMAL HIGH (ref 70–99)
Glucose-Capillary: 159 mg/dL — ABNORMAL HIGH (ref 70–99)
Glucose-Capillary: 166 mg/dL — ABNORMAL HIGH (ref 70–99)
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
## 2014-03-19 NOTE — Progress Notes (Signed)
Occupational Therapy Note  Patient Details  Name: Denise [name] MRN: [id number] Date of Birth: 23-Nov-1940  Today's Date: 03/19/2014 OT Individual Time: 1100-1200 OT Individual Time Calculation (min): 60 min   Pt denied pain Individual Therapy  Pt initially engaged in sit<>stand and standing balance activities including table top activities while standing with unilateral UE support and progressing to no UE support.  Pt required min A for sit<>stand and standing balance. Pt required 5 rest breaks during 30 mins of activities.  Pt requested to use toilet and transitioned to room for toilet tranfers and toileting tasks.  Pt required mod A for toilet transfers using grab bars and min A for standing balance to facilitate therapist assisting with pulling pants.    Lavone NeriLanier, Maryanne Huneycutt [facility] Hl[facility] CorpChappell 03/19/2014, 1:27 PM
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
## 2014-03-19 NOTE — Plan of Care (Signed)
Problem: RH BLADDER ELIMINATION Goal: RH STG MANAGE BLADDER WITH ASSISTANCE STG Manage Bladder With mod Assistance  Outcome: Not Progressing Incontinent QHS

## 2014-03-20 ENCOUNTER — Inpatient Hospital Stay (HOSPITAL_COMMUNITY): Payer: Medicare Other | Admitting: Occupational Therapy

## 2014-03-20 ENCOUNTER — Inpatient Hospital Stay (HOSPITAL_COMMUNITY): Payer: Medicare Other

## 2014-03-20 ENCOUNTER — Inpatient Hospital Stay (HOSPITAL_COMMUNITY): Payer: Medicare Other | Admitting: Physical Therapy

## 2014-03-20 LAB — GLUCOSE, CAPILLARY
GLUCOSE-CAPILLARY: 70 mg/dL (ref 70–99)
GLUCOSE-CAPILLARY: 114 mg/dL — AB (ref 70–99)
GLUCOSE-CAPILLARY: 79 mg/dL (ref 70–99)
Glucose-Capillary: 170 mg/dL — ABNORMAL HIGH (ref 70–99)

## 2014-03-20 LAB — CREATININE, SERUM
CREATININE: 0.44 mg/dL — AB (ref 0.50–1.10)
GFR calc Af Amer: >90 mL/min (ref >90)
GFR calc non Af Amer: >90 mL/min (ref >90)
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
## 2014-03-20 NOTE — Progress Notes (Signed)
Recreational Therapy Session Note  Patient Details  Name: Denise [name] MRN: [id number] Date of Birth: [birth year] Today's Date: 03/20/2014  Pain: c/o left shoulder pain during activity, rest provided relief Skilled Therapeutic Interventions/Progress Updates: Session focused on activity tolerance, sit-stands, standing tolerance, & RUE use.  Pt stood at tabletop to put together a jigsaw puzzle without UE support for 5 minutes, 2 minutes, 1 minute with min assist.  Pt required seating rest breaks due to fatigue and c/o left shoulder pain.    Therapy/Group: Co-Treatment   Frenchie Pribyl 03/20/2014, 4:51 PM
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
## 2014-03-20 NOTE — Plan of Care (Signed)
Problem: RH Wheelchair Mobility Goal: LTG Patient will propel w/c in controlled environment (PT) LTG: Patient will propel wheelchair in controlled environment, # of feet with assist (PT)  Outcome: Not Applicable Date Met:  17/91/99 D/C due to shoulder pain and clavicle fracture Goal: LTG Patient will propel w/c in home environment (PT) LTG: Patient will propel wheelchair in home environment, # of feet with assistance (PT).  Outcome: Not Applicable Date Met:  57/90/09 D/C due to shoulder pain and clavicle fracture
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
## 2014-03-21 ENCOUNTER — Inpatient Hospital Stay (HOSPITAL_COMMUNITY): Payer: Medicare Other

## 2014-03-21 LAB — GLUCOSE, CAPILLARY
GLUCOSE-CAPILLARY: 92 mg/dL (ref 70–99)
Glucose-Capillary: 93 mg/dL (ref 70–99)
Glucose-Capillary: 206 mg/dL — ABNORMAL HIGH (ref 70–99)
Glucose-Capillary: 185 mg/dL — ABNORMAL HIGH (ref 70–99)
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
## 2014-03-21 NOTE — Progress Notes (Signed)
Occupational Therapy Note  Patient Details  Name: Laqueta CarinaBarbara M Capers MRN: [id number] Date of Birth: 1940/09/18  Today's Date: 03/21/2014 OT Individual Time: 1100-1200 OT Individual Time Calculation (min): 60 min   Pt initially denied pain but c/o increased discomfort in Rt shoulder with activity (unrated); RN aware, repositioned, and emotional support provided Individual therapy  Pt engaged in dynamic standing tasks for table top tasks (game of checkers).  Pt required min A for sit<>stand and close supervision for standing balance with unilateral UE support.  Pt required 4 rest breaks during 30 mins of activity.  Pt attended to tasks without verbal cues.  Pt requested use of toilet and transitioned to room and performed toilet transfers and toileting tasks with min A/steady A.  Pt amb with RW into room and washed hands while standing at sink before transferring to recliner.  Focus on activity tolerance, sit<>stand, standing balance, functional amb with RW for home mgmt tasks, and safety awareness.   Lavone NeriLanier, Dorri Ozturk [facility] 03/21/2014, 12:50 PM

## 2014-03-21 NOTE — Progress Notes (Signed)
Physical Therapy Weekly Progress Note  Patient Details  Name: KARMEL PATRICELLI MRN: [id number] Date of Birth: March 05, 1940  Beginning of progress report period: March 14, 2014 End of progress report period: March 21, 2014  Patient has made steady progress and has met 4 of 4 short term goals.  Pt is currently min-mod A overall for bed mobility, basic transfers, gait short distances with RW and stair negotiation.  Pt's main limiting factors are pain and fear of falling.    Patient continues to demonstrate the following deficits: impaired activity tolerance and endurance, pain, cognition, impaired strength and ROM, impaired balance, gait and therefore will continue to benefit from skilled PT intervention to enhance overall performance with activity tolerance, balance, postural control, ability to compensate for deficits, functional use of  right upper extremity and right lower extremity, attention and gait.  Patient progressing toward long term goals..  Plan of care revisions: w/c mobility goal D/C secondary to not appropriate due to UE fractures and pain; stair goal adjusted to min A with R rail.  PT Short Term Goals Week 1:  PT Short Term Goal 1 (Week 1): Pt will be able to perform all bed mobility with mod A of one person. PT Short Term Goal 1 - Progress (Week 1): Met PT Short Term Goal 2 (Week 1): Pt will be perform tranfers bed<>chair with mod cues and assist from one person. PT Short Term Goal 2 - Progress (Week 1): Met PT Short Term Goal 3 (Week 1): Pt will ambulate 25' in a controlled environment with LRAD and mod A from one person. PT Short Term Goal 3 - Progress (Week 1): Met PT Short Term Goal 4 (Week 1): Pt will stand with B UE support for >3 mintues during functional task and mod A from one person. PT Short Term Goal 4 - Progress (Week 1): Met Week 2:  PT Short Term Goal 1 (Week 2): = LTG of supervision overall except min A stairs  Therapy Documentation Precautions:   Precautions Precautions: Fall Precaution Comments:   Required Braces or Orthoses: Sling Restrictions Weight Bearing Restrictions: Yes RUE Weight Bearing: Weight bearing as tolerated RLE Weight Bearing: Weight bearing as tolerated LLE Weight Bearing: Weight bearing as tolerated Pain: Pain Assessment Pain Assessment: No/denies pain Faces Pain Scale: Hurts a little bit Pain Type: Acute pain Pain Location: Shoulder Pain Orientation: Right Pain Descriptors / Indicators: Aching;Sore Pain Onset: With Activity Pain Intervention(s): RN made aware;Repositioned;Emotional support  See FIM for current functional status  Raylene Everts [facility] 03/21/2014, 12:49 PM

## 2014-03-21 NOTE — Progress Notes (Signed)
Occupational Therapy Weekly Progress Note  Patient Details  Name: Denise [name] MRN: [id number] Date of Birth: [birth year]  Beginning of progress report period: March 14, 2014 End of progress report period: March 21, 2014   Patient has met 4 of 4 short term goals.  Pt has made steady progress with BADLs during this admission.  Pt is currently mod A for LB bathing and dressing tasks, min/supervision for UB bathing and dressing, and min A for functional transfers and functional amb with RW for home mgmt tasks.  Pt continues to required min verbal cues for safety awareness.  Pt's husband has not been present for therapy sessions.  Patient continues to demonstrate the following deficits: decreased cognition, decreased I in self care, decreased activity tolerance, decreased endurance, decreased strength, and decreased ROM and therefore will continue to benefit from skilled OT intervention to enhance overall performance with BADL.  Patient progressing toward long term goals..  Continue plan of care.  OT Short Term Goals Week 1:  OT Short Term Goal 1 (Week 1): Pt will tranfer to BSC/ toilet with mod A  OT Short Term Goal 1 - Progress (Week 1): Met OT Short Term Goal 2 (Week 1): Pt will perform sit to stand with mod A in prep for clothing management in standin OT Short Term Goal 2 - Progress (Week 1): Met OT Short Term Goal 3 (Week 1): Pt will don shirt with min A in unsupported sitting  OT Short Term Goal 3 - Progress (Week 1): Met OT Short Term Goal 4 (Week 1): Pt will perform bed mobility in prep for bathing and dressing tasks out of bed with mod A  OT Short Term Goal 4 - Progress (Week 1): Met Week 2:  OT Short Term Goal 1 (Week 2): STG=LTG secondary to ELOS    Therapy Documentation Precautions:  Precautions Precautions: Fall Precaution Comments:   Required Braces or Orthoses: Sling Restrictions Weight Bearing Restrictions: Yes RUE Weight Bearing: Weight bearing as tolerated RLE  Weight Bearing: Weight bearing as tolerated LLE Weight Bearing: Weight bearing as tolerated    See FIM for current functional status    Leroy Libman 03/21/2014, 1:15 PM
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
## 2014-03-22 ENCOUNTER — Inpatient Hospital Stay (HOSPITAL_COMMUNITY): Payer: Medicare Other

## 2014-03-22 LAB — GLUCOSE, CAPILLARY
GLUCOSE-CAPILLARY: 133 mg/dL — AB (ref 70–99)
Glucose-Capillary: 68 mg/dL — ABNORMAL LOW (ref 70–99)
Glucose-Capillary: 195 mg/dL — ABNORMAL HIGH (ref 70–99)
Glucose-Capillary: 215 mg/dL — ABNORMAL HIGH (ref 70–99)
Glucose-Capillary: 190 mg/dL — ABNORMAL HIGH (ref 70–99)
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
## 2014-03-22 NOTE — Progress Notes (Signed)
Physical Therapy Session Note  Patient Details  Name: Denise [name] MRN: [id number] Date of Birth: [birth year]  Today's Date: 03/22/2014 PT Individual Time: 1025-1125 PT Individual Time Calculation (min): 60 min   Short Term Goals: Week 2:  PT Short Term Goal 1 (Week 2): = LTG of supervision overall except min A stairs  Skilled Therapeutic Interventions/Progress Updates:   Session focused on functional transfers from w/c, soft bed in ADL apartment and to/from toilet, dynamic standing balance to perform hygiene and clothing management with min A, bed mobility on flat bed with min to mod A needed for LE and trunk management and cues for technique, gait training with RW x 40', x 20', and x 25' on tiled and carpeted surfaces to simulate home environment with steady A and cues for upright posture and encourgement, and seated LE therex for strengthening including heel/toe raises, LAQ, and marches x 20 reps each BLE. Transferred to recliner with safety belt on at end of session.   Therapy Documentation Precautions:  Precautions Precautions: Fall Precaution Comments:   Required Braces or Orthoses: Sling Restrictions Weight Bearing Restrictions: Yes RUE Weight Bearing: Weight bearing as tolerated RLE Weight Bearing: Weight bearing as tolerated LLE Weight Bearing: Weight bearing as tolerated   Pain: R shoulder and hip pain with mobility - rest breaks as needed.  See FIM for current functional status  Therapy/Group: Individual Therapy  Denise [name], Denise [name] [facility] 03/22/2014, 11:39 AM
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
## 2014-03-22 NOTE — Progress Notes (Signed)
Hypoglycemic Event  CBG: 68  Treatment: grape juice and graham crackers   Symptoms: none   Follow-up CBG: Time:0700 Result:133  Possible Reasons for Event: low food intake  Comments/MD notified:Passed CBG to Day shift nurse. MD notified.     Denise [name]  Remember to initiate Hypoglycemia Order Set & complete

## 2014-03-22 NOTE — Progress Notes (Signed)
Physical Therapy Session Note  Patient Details  Name: Denise [name] MRN: [id number] Date of Birth: January 09, 1941  Today's Date: 03/22/2014 PT Individual Time: 1300-1400 PT Individual Time Calculation (min): 60 min   Short Term Goals: Week 2:  PT Short Term Goal 1 (Week 2): = LTG of supervision overall except min A stairs  Skilled Therapeutic Interventions/Progress Updates:   Session focused on functional transfers, gait with RW, toilet transfers, stair negotiation, dynamic sitting and standing balance, and overall endurance/activity tolerance. Pt requires steady to min A for most transfers with cues for hand placement and technique. From slightly elevated surface, able to perform with S. With bilateral rails pt able to perform 5 stairs up/down with heavy min to light mod A (increased assist needed for descent as fatigued). When attempted with 1 rail to simulate home environment, pt unable. Unable to get clear if it was due to pain or fatigue/weakness. Pt's husband arrived towards end of session and discussed equipment needs as well as status/goals at this time. He plans to bring in the RW they have at home to determine if it will be appropriate. Discussed possibility of use of w/c for longer distances, but husband prefers to wait until next week to determine how far she is walking if they would want that. Educated that stairs and bed mobility at this time require the most physical assist and planning for hands on family education next week. Left in recliner with safety belt donned and husband at bedside.   Therapy Documentation Precautions:  Precautions Precautions: Fall Precaution Comments:   Required Braces or Orthoses: Sling Restrictions Weight Bearing Restrictions: Yes RUE Weight Bearing: Weight bearing as tolerated RLE Weight Bearing: Weight bearing as tolerated LLE Weight Bearing: Weight bearing as tolerated  Pain: C/o increased pain in R shoulder and R hip - premedicated. Rest breaks  taken throughout session. R shoulder sling donned end of session for comfort.  See FIM for current functional status  Therapy/Group: Individual Therapy  [name], Mayte Diers [facility] 03/22/2014, 2:00 PM
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
## 2014-03-23 ENCOUNTER — Inpatient Hospital Stay (HOSPITAL_COMMUNITY): Payer: Medicare Other

## 2014-03-23 LAB — GLUCOSE, CAPILLARY
GLUCOSE-CAPILLARY: 86 mg/dL (ref 70–99)
GLUCOSE-CAPILLARY: 135 mg/dL — AB (ref 70–99)
Glucose-Capillary: 185 mg/dL — ABNORMAL HIGH (ref 70–99)
Glucose-Capillary: 136 mg/dL — ABNORMAL HIGH (ref 70–99)

## 2014-03-23 NOTE — Progress Notes (Signed)
Physical Therapy Session Note  Patient Details  Name: Denise [name] MRN: [id number] Date of Birth: [birth year]  Today's Date: 03/23/2014 PT Individual Time: 0800-0845 PT Individual Time Calculation (min): 45 min   Short Term Goals: Week 2:  PT Short Term Goal 1 (Week 2): = LTG of supervision overall except min A stairs  Skilled Therapeutic Interventions/Progress Updates:    Session focused on reorientation using visual aids and verbal cues though pt with little carryover thoughout session, functional transfers at S to steady A level including toilet transfers, bed transfers, and w/c transfers, short distance ambulation to and from bathroom with close S and cues for upright posture, stair negotiation with R rail only (only able to perform on 4" step height) up/down 3 steps with min to mod A for home entry preparation and assisted pt with dressing seated EOB prior to leaving room. Pt requires multiple rest breaks during session due to fatigue and pain. Left up in w/c with safety belt in tact and call bell in reach.   Therapy Documentation Precautions:  Precautions Precautions: Fall Precaution Comments:   Required Braces or Orthoses: Sling Restrictions Weight Bearing Restrictions: Yes RUE Weight Bearing: Weight bearing as tolerated RLE Weight Bearing: Weight bearing as tolerated LLE Weight Bearing: Weight bearing as tolerated  Pain:  c/o intermittent pain in R shoulder and hip during mobility - able to work through with rest breaks.   See FIM for current functional status  Therapy/Group: Individual Therapy  [name], Lindsay Soulliere [facility] 03/23/2014, 11:33 AM
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
## 2014-03-24 ENCOUNTER — Inpatient Hospital Stay (HOSPITAL_COMMUNITY): Payer: Medicare Other | Admitting: *Deleted

## 2014-03-24 LAB — GLUCOSE, CAPILLARY
GLUCOSE-CAPILLARY: 133 mg/dL — AB (ref 70–99)
Glucose-Capillary: 105 mg/dL — ABNORMAL HIGH (ref 70–99)
Glucose-Capillary: 134 mg/dL — ABNORMAL HIGH (ref 70–99)
Glucose-Capillary: 152 mg/dL — ABNORMAL HIGH (ref 70–99)
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
## 2014-03-24 NOTE — Progress Notes (Signed)
Physical Therapy Session Note  Patient Details  Name: Denise CarinaBarbara M Kruszka MRN: [id number] Date of Birth: [birth year]  Today's Date: 03/24/2014 PT Individual Time: 1402-1502 PT Individual Time Calculation (min): 60 min    Skilled Therapeutic Interventions/Progress Updates:  Patient in bathroom at the beginning of the session , assisted with hygiene and with donning her pants on.  W/c propulsion training to and from room to gym with min cues for path following,pt uses B UE to propel w/c. Gait Training with FWW 3 x 60 feet with CGA and cues for posture and to reduce WB through R UR due to increased pain in R anterior shoulder region/clavicle. Stair negotiating 2 x 3 steps with CGA,step to pattern. Activity tolerance training in standing ,placing rings on basketball loop, 3 x 3 min each time w/o UE support and with min A from therapist. Patient requires increased time to rest between activities. At the end of the session patient left in a room with all needs within reach adn QR belt on , nurse has been notified that patient is up.   Therapy Documentation Precautions:  Precautions Precautions: Fall Precaution Comments:   Required Braces or Orthoses: Sling Restrictions Weight Bearing Restrictions: Yes RUE Weight Bearing: Weight bearing as tolerated RLE Weight Bearing: Weight bearing as tolerated LLE Weight Bearing: Weight bearing as tolerated Vital Signs: Therapy Vitals Temp: 98.8 F (37.1 C) Temp Source: Oral Pulse Rate: 90 Resp: 18 BP: (!) 140/46 mmHg Patient Position (if appropriate): Sitting Oxygen Therapy SpO2: 96 % O2 Device: Not Delivered Pain: Pain Assessment Pain Assessment: No/denies pain Pain Score: 0-No pain Pain Intervention(s): Medication (See eMAR) (scheduled tramadol)    See FIM for current functional status  Therapy/Group: Individual Therapy  Dorna MaiCzajkowska, Bradly Sangiovanni W 03/24/2014, 3:10 PM

## 2014-03-25 ENCOUNTER — Inpatient Hospital Stay (HOSPITAL_COMMUNITY): Payer: Medicare Other

## 2014-03-25 LAB — CBC
HEMATOCRIT: 41 % (ref 36.0–46.0)
Hemoglobin: 13.5 g/dL (ref 12.0–15.0)
MCH: 31.6 pg (ref 26.0–34.0)
MCHC: 32.9 g/dL (ref 30.0–36.0)
MCV: 96 fL (ref 78.0–100.0)
Platelets: 365 10*3/uL (ref 150–400)
RBC: 4.27 MIL/uL (ref 3.87–5.11)
RDW: 13.9 % (ref 11.5–15.5)
WBC: 11 10*3/uL — ABNORMAL HIGH (ref 4.0–10.5)

## 2014-03-25 LAB — BASIC METABOLIC PANEL
Anion gap: 6 (ref 5–15)
BUN: 18 mg/dL (ref 6–23)
CHLORIDE: 104 mmol/L (ref 96–112)
CO2: 27 mmol/L (ref 19–32)
CREATININE: 0.43 mg/dL — AB (ref 0.50–1.10)
Calcium: 9.1 mg/dL (ref 8.4–10.5)
GFR calc Af Amer: >90 mL/min (ref >90)
GFR calc non Af Amer: >90 mL/min (ref >90)
Glucose, Bld: 82 mg/dL (ref 70–99)
Potassium: 4.5 mmol/L (ref 3.5–5.1)
Sodium: 137 mmol/L (ref 135–145)

## 2014-03-25 LAB — GLUCOSE, CAPILLARY
GLUCOSE-CAPILLARY: 70 mg/dL (ref 70–99)
Glucose-Capillary: 110 mg/dL — ABNORMAL HIGH (ref 70–99)
Glucose-Capillary: 66 mg/dL — ABNORMAL LOW (ref 70–99)
Glucose-Capillary: 98 mg/dL (ref 70–99)
Glucose-Capillary: 155 mg/dL — ABNORMAL HIGH (ref 70–99)

## 2014-03-25 MED ORDER — GLYBURIDE 2.5 MG PO TABS
2.5000 mg | ORAL_TABLET | Freq: Two times a day (BID) | ORAL | Status: DC
  Administered 2014-03-26 – 2014-03-29 (×7): 2.5 mg via ORAL
  Filled 2014-03-25 (×9): qty 1

## 2014-03-25 NOTE — Plan of Care (Signed)
Problem: RH BLADDER ELIMINATION Goal: RH STG MANAGE BLADDER WITH ASSISTANCE STG Manage Bladder With mod Assistance  Outcome: Progressing Urgency and frequent urination

## 2014-03-25 NOTE — Progress Notes (Signed)
Physical Therapy Session Note  Patient Details  Name: Denise [name] MRN: [id number] Date of Birth: September 26, 1940  Today's Date: 03/25/2014 PT Individual Time: 1500-1530 PT Individual Time Calculation (min): 30 min   Skilled Therapeutic Interventions/Progress Updates:   Session focused on initiating family education with pt's husband, Denise [name], in regards to stair negotiation, basic transfers, and trial with rollator for functional gait. Pt's husband brought in RW from home but would need new back pieces to make even height. Husband expressing wanting to encourage community mobility with patient upon discharge but due to fatigue, suggested trial with rollator. Pt will require continued practice with locking brakes, but demonstrated safe S level gait with it for short trial including transfer on and off the seat. Therapist demonstrated up/down 4 steps providing min A with R handrail and then husband return demonstrated this x 2 to simulate 8 steps for home entry safely. Both feel comfortable with this and recommended to have chair between sets of stairs in case patient needs a rest break. Returned back to room and notified nurse tech for pt to use bathroom.   Therapy Documentation Precautions:  Precautions Precautions: Fall Precaution Comments:   Required Braces or Orthoses: Sling Restrictions Weight Bearing Restrictions: Yes RUE Weight Bearing: Non weight bearing RLE Weight Bearing: Weight bearing as tolerated LLE Weight Bearing: Weight bearing as tolerated Pain: C/o discomfort in R "haunch" during stairs but resolved with rest breaks.   See FIM for current functional status  Therapy/Group: Individual Therapy  [name], Shanan Mcmiller [facility] 03/25/2014, 4:15 PM

## 2014-03-25 NOTE — Progress Notes (Signed)
Patient is showing improvement in mobility and has had low BS today. Will decrease diabeta to 2.5mg  bid and monitor.

## 2014-03-25 NOTE — Progress Notes (Signed)
Occupational Therapy Note  Patient Details  Name: Denise [name] MRN: [id number] Date of Birth: 04-Sep-1940  Today's Date: 03/25/2014 OT Individual Time: 1300-1400 OT Individual Time Calculation (min): 60 min   Pt c/o increased pain in right hip when standing (unrated); alleviated with no c/o when seated Individual therapy  Pt practiced tub bench transfers X 3 with supervision.  Pt transitioned to therapy gym and engaged in dynamic standing tasks to facilitate increased activity tolerance and dynamic standing balance.  Pt stood 10 mins X 2 with rest break.  Pt transitioned to room and amb with RW to access bathroom to use toilet.  Pt completed all tasks at close supervision level.  Focus on activity tolerance, sit<>stand, functional amb with RW, standing balance, and safety awareness.   Lavone NeriLanier, [place] [facility]appell 03/25/2014, 2:12 PM
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
## 2014-03-25 NOTE — Progress Notes (Signed)
Occupational Therapy Session Note  Patient Details  Name: Denise [name] MRN: [id number] Date of Birth: 1940-03-31  Today's Date: 03/25/2014 OT Individual Time: 1000-1100 OT Individual Time Calculation (min): 60 min    Short Term Goals: Week 2:  OT Short Term Goal 1 (Week 2): STG=LTG secondary to ELOS  Skilled Therapeutic Interventions/Progress Updates:    Pt resting in w/c upon arrival.  Pt declined shower this morning but agreed to shower tomorrow.  Pt stated she was "too exhausted" and a shower would bee "too much".  Pt agreed to bathing and dressing with sit<>stand from w/c at sink.  Pt required steady A for sit<>stand and standing balance.  Pt required extra time to complete all tasks with multiple rest breaks.  Pt required max verbal and visual cues for orientation to place, situation, and date.  Focus on activity tolerance, sit<>stand, standing balance, and safety awareness.  Therapy Documentation Precautions:  Precautions Precautions: Fall Precaution Comments:   Required Braces or Orthoses: Sling Restrictions Weight Bearing Restrictions: Yes RUE Weight Bearing: Non weight bearing RLE Weight Bearing: Weight bearing as tolerated LLE Weight Bearing: Weight bearing as tolerated  Pain: Pain Assessment Pain Assessment: No/denies pain Pain Score: 0-No pain  See FIM for current functional status  Therapy/Group: Individual Therapy  Rich BraveLanier, Lonnetta Kniskern Chappell 03/25/2014, 11:01 AM
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
## 2014-03-25 NOTE — Progress Notes (Signed)
Hypoglycemic Event  CBG: 66  Treatment: 15 GM carbohydrate snack  Symptoms: None  Follow-up CBG: Time:1645 CBG Result:98  Possible Reasons for Event: Inadequate meal intake  Comments/MD notified:Pam Love, PA- no new orders at this time    Dani GobbleReardon, Tanvir Hipple J  Remember to initiate Hypoglycemia Order Set & complete

## 2014-03-26 ENCOUNTER — Ambulatory Visit: Payer: Medicare Other | Admitting: Internal Medicine

## 2014-03-26 ENCOUNTER — Inpatient Hospital Stay (HOSPITAL_COMMUNITY): Payer: Medicare Other

## 2014-03-26 LAB — GLUCOSE, CAPILLARY
GLUCOSE-CAPILLARY: 92 mg/dL (ref 70–99)
GLUCOSE-CAPILLARY: 180 mg/dL — AB (ref 70–99)
Glucose-Capillary: 102 mg/dL — ABNORMAL HIGH (ref 70–99)
Glucose-Capillary: 130 mg/dL — ABNORMAL HIGH (ref 70–99)

## 2014-03-26 LAB — URINE CULTURE: Colony Count: 40000
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
## 2014-03-26 NOTE — Progress Notes (Signed)
Social Work Patient ID: Denise [name], female   DOB: Oct 31, 1940, 74 y.o.   MRN: [id number]   Have reviewed team conference with pt and husband.  Both pleased with gains and remain agreeable with d/c date of 3/11.  Discussed DME and follow up tx plans.  Will make all arrangements when I return on the 10th.  Cabrina Shiroma, LCSW

## 2014-03-26 NOTE — Progress Notes (Signed)
Recreational Therapy Session Note  Patient Details  Name: Denise [name] MRN: [id number] Date of Birth: 02-26-1940 Today's Date: 03/26/2014  Pain:c/o R "haunch" pain, relieved with seated rest breaks  Skilled Therapeutic Interventions/Progress Updates: Session focused on activity tolerance and home management.  Pt ambulated with rollator to kitchen with supervision, cues for safety (how and when to lock & unlock the brakes)  Determined a simple meal prep task of making jello but unable to complete task due to time constraint.   Therapy/Group: Co-Treatment  Shakevia Sarris 03/26/2014, 2:12 PM

## 2014-03-26 NOTE — Progress Notes (Signed)
Occupational Therapy Session Note  Patient Details  Name: Denise [name] MRN: [id number] Date of Birth: [birth year]  Today's Date: 03/26/2014 OT Individual Time: 1100-1200 OT Individual Time Calculation (min): 60 min    Short Term Goals: Week 2:  OT Short Term Goal 1 (Week 2): STG=LTG secondary to ELOS  Skilled Therapeutic Interventions/Progress Updates:    Pt engaged in BADL retraining including bathing at shower level and dressing with sit<>stand from seat.  Pt amb with RW to bathroom to use toilet before transferring to shower seat.  Pt required min verbal cues for sequencing during shower and to remember which body parts she had bathed.  Pt returned to room and completed dressing with sit<>stand from w/c.  Pt required assistance tieing shoes.  Focus on activity tolerance, sit<>stand, standing balance, functional amb with RW, and safety awareness.  Pt continues to required multiple rest breaks throughout session.   Therapy Documentation Precautions:  Precautions Precautions: Fall Precaution Comments:   Required Braces or Orthoses: Sling Restrictions Weight Bearing Restrictions: Yes RUE Weight Bearing: Non weight bearing RLE Weight Bearing: Weight bearing as tolerated LLE Weight Bearing: Weight bearing as tolerated   Pain: Pain Assessment Pain Assessment: No/denies pain Pain Score: 0-No pain  See FIM for current functional status  Therapy/Group: Individual Therapy  Rich BraveLanier, Denise [name] 03/26/2014, 12:03 PM

## 2014-03-26 NOTE — Progress Notes (Signed)
Occupational Therapy Note  Patient Details  Name: Denise [name] MRN: [id number] Date of Birth: [birth year]  Today's Date: 03/26/2014 OT Individual Time: 1300-1400 OT Individual Time Calculation (min): 60 min   Pt c/o right "haunch" pain with standing and activity; RN aware and repositioned Individual Therapy  Pt seated in w/c upon arrival and agreeable to therapy.  Pt requested to use bathroom prior to transitioning to ADL apartment.  Pt amb with RW (close S) to bathroom and completed all toileting tasks with supervision.  Pt ambulated with Rollator from room to RN station before stating that pain in her right "haunch" was too painful.  Pt required max verbal cues for safety with Rollator. Pt transitioned to ADL apartment and assisted with preparation of strawberry Jello.  Pt followed directions correctly but referred to directions on box numerous times throughout preparation.  Shortly after adding hot water, pt asked therapist if she had already used hot water. Pt returned to room and remained in w/c with QRB in place.  Focus on activity tolerance, sit<>stand, standing balance, cognitive remediation, and safety awareness.   Denise [name], Denise [name] 03/26/2014, 2:43 PM

## 2014-03-27 ENCOUNTER — Inpatient Hospital Stay (HOSPITAL_COMMUNITY): Payer: Medicare Other | Admitting: *Deleted

## 2014-03-27 ENCOUNTER — Inpatient Hospital Stay (HOSPITAL_COMMUNITY): Payer: Medicare Other

## 2014-03-27 LAB — GLUCOSE, CAPILLARY
Glucose-Capillary: 128 mg/dL — ABNORMAL HIGH (ref 70–99)
Glucose-Capillary: 86 mg/dL (ref 70–99)
Glucose-Capillary: 89 mg/dL (ref 70–99)
Glucose-Capillary: 127 mg/dL — ABNORMAL HIGH (ref 70–99)

## 2014-03-27 MED ORDER — TRAMADOL HCL 50 MG PO TABS
50.0000 mg | ORAL_TABLET | Freq: Four times a day (QID) | ORAL | Status: DC
  Administered 2014-03-27 – 2014-03-29 (×8): 50 mg via ORAL
  Filled 2014-03-27 (×8): qty 1

## 2014-03-27 MED ORDER — OXYBUTYNIN CHLORIDE 5 MG PO TABS
5.0000 mg | ORAL_TABLET | Freq: Two times a day (BID) | ORAL | Status: DC
  Administered 2014-03-27 – 2014-03-29 (×5): 5 mg via ORAL
  Filled 2014-03-27 (×7): qty 1
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
## 2014-03-27 NOTE — Progress Notes (Signed)
While rounding on patients, patient was found standing at sink washing her hands.  I asked her what she was doing, she said she had taken herself to bathroom and back.  She had taken her quick release belt off.  I informed patient to call before trying to get up unassisted.  Patient agreed and was apologetic.  Dani Gobbleeardon, Karman Veney J, RN

## 2014-03-27 NOTE — Progress Notes (Signed)
Occupational Therapy Session Note  Patient Details  Name: Denise [name] MRN: [id number] Date of Birth: 05-10-1940  Today's Date: 03/27/2014 OT Individual Time: 0700-0800 OT Individual Time Calculation (min): 60 min    Short Term Goals: Week 2:  OT Short Term Goal 1 (Week 2): STG=LTG secondary to ELOS  Skilled Therapeutic Interventions/Progress Updates:    Pt asleep upon arrival but easily aroused.  Pt continues to require tot A for orientation but agreeable to participating in therapy.  Pt declined shower this morning but engaged in bathing and dressing with sit<>stand from w/c at sink.  Pt required increased time to complete tasks with multiple rest breaks.  Pt required max verbal cues to use visual cues to assist with orientation.  Pt completed all transitional movements and transfers with supervision.  Pt amb with RW to bathroom to use toilet prior to engaging in BADLs at sink.  Pt required assistance with tieing shoes but was able to don socks and shoes this morning.  Focus on activity tolerance, sit<>stand, standing balance, transitional movements, functional amb with RW, and safety awareness.  Therapy Documentation Precautions:  Precautions Precautions: Fall Precaution Comments:   Required Braces or Orthoses: Sling Restrictions Weight Bearing Restrictions: Yes RUE Weight Bearing: Weight bearing as tolerated RLE Weight Bearing: Weight bearing as tolerated LLE Weight Bearing: Weight bearing as tolerated Pain: Pain Assessment Pain Assessment: No/denies pain  See FIM for current functional status  Therapy/Group: Individual Therapy  Rich BraveLanier, Sam Wunschel Chappell 03/27/2014, 8:00 AM

## 2014-03-27 NOTE — Progress Notes (Signed)
Physical Therapy Session Note  Patient Details  Name: Denise [name] MRN: [id number] Date of Birth: [birth year]  Today's Date: 03/27/2014 PT Individual Time: 1100-1200 and 1300-1400 PT Individual Time Calculation (min): 60 min and 60 min  Short Term Goals: Week 2:  PT Short Term Goal 1 (Week 2): = LTG of supervision overall except min A stairs  Skilled Therapeutic Interventions/Progress Updates:    AM Session: Patient received sitting in wheelchair with husband present. Husband present to observe session. Session focused on safety and carry over with functional transfers and ambulation as well as stair negotiation. Patient performed functional ambulation >150' x2 and several bouts of 50-100' during session with rollator and initial min guard quickly progressing to close supervision. Noted slightly antalgic gait on R side. Patient requires repeated verbal cues for proper hand placement and safe management of rollator (locking brakes) during transfers. Stair negotiation x2 stairs with R handrail, negotiates with step to pattern sideways with minA. Patient fearful of descending steps, noted decreased L knee flexion to assist with lowering R LE.   Patient with reports of needing to use bathroom, ambulates in and out, manages clothing, performs hygiene, and washes hands with close supervision, verbal cues for use of grab bar when necessary. Patient returned to room and left sitting in wheelchair with nurse tech present; nurse tech stating she will don seatbelt upon leaving.  PM Session: Patient received sitting in wheelchair, husband joining at beginning of session. Session focused on wheelchair mobility, car transfers, stair negotiation, and increasing activity tolerance. Wheelchair mobility 175' x1 with B UE and supervision to improve activity tolerance. Simulated car transfer to sedan height x2 with close supervision/verbal cues for sequencing to get into car, minA for sit>stand to get out. Stair  negotiation 3 stairs x2 with minA and verbal cues for sequencing, once performed with therapist and once performed with husband with R handrail sideways with step to pattern. [name] requires verbal cues for hands on rather than SBA. Visual demonstration performed once with therapist first for husband to observe. Improved ability to descend steps this trial.   Supine<>sit on treatment mat with supervision, min cues for sequencing. Patient with improved ability to lift/lower R LE on/off mat. Patient with reports of needing to use bathroom, ambulates in and out, manages clothing, performs hygiene, and washes hands with close supervision, verbal cues for use of grab bar when necessary. Patient returned to room and left sitting in wheelchair with seatbelt donned and husband present.  Therapy Documentation Precautions:  Precautions Precautions: Fall Precaution Comments:   Required Braces or Orthoses: Sling Restrictions Weight Bearing Restrictions: Yes RUE Weight Bearing: Weight bearing as tolerated RLE Weight Bearing: Weight bearing as tolerated LLE Weight Bearing: Weight bearing as tolerated Pain: Pain Assessment Pain Assessment: 0-10 (unable to rate, but reports pain in R hip) Pain Score: 0-No pain Locomotion : Ambulation Ambulation/Gait Assistance: 5: Supervision;4: Min guard   See FIM for current functional status  Therapy/Group: Individual Therapy  Chipper HerbBridget S Rikayla Demmon S. Maysie Parkhill, PT, DPT 03/27/2014, 12:08 PM

## 2014-03-27 NOTE — Progress Notes (Signed)
Recreational Therapy Discharge Summary Patient Details  Name: Denise [name] MRN: [id number] Date of Birth: 1940-08-15 Today's Date: 03/27/2014  Long term goals set: 1  Long term goals met: 1  Comments on progress toward goals: Pt is discharging home 3/11 with husband to provide 24 hour supervision.  Pt requires set up assistance and verbal cuing for leisure task completion due to decreased cognition and safety. Reasons for discharge: discharge from hospital  Patient/family agrees with progress made and goals achieved: Yes  Zvi Duplantis 03/27/2014, 3:08 PM

## 2014-03-28 ENCOUNTER — Inpatient Hospital Stay (HOSPITAL_COMMUNITY): Payer: Medicare Other

## 2014-03-28 ENCOUNTER — Encounter (HOSPITAL_COMMUNITY): Payer: Medicare Other

## 2014-03-28 LAB — GLUCOSE, CAPILLARY
GLUCOSE-CAPILLARY: 80 mg/dL (ref 70–99)
GLUCOSE-CAPILLARY: 109 mg/dL — AB (ref 70–99)
Glucose-Capillary: 170 mg/dL — ABNORMAL HIGH (ref 70–99)
Glucose-Capillary: 125 mg/dL — ABNORMAL HIGH (ref 70–99)

## 2014-03-28 NOTE — Progress Notes (Signed)
Physical Therapy Discharge Summary  Patient Details  Name: Denise [name] MRN: [id number] Date of Birth: 22-Mar-1940  Patient has met 7 of 8 long term goals due to improved activity tolerance, improved balance, improved postural control, increased strength, decreased pain, ability to compensate for deficits and functional use of  right upper extremity and right lower extremity.  Patient to discharge at an ambulatory level Supervision using rollator. Pt requires cues for safety with mobility due to decreased cognitive function. [name] is independent to provide the necessary physical and cognitive assistance at discharge. He successfully completed family education.  Reasons goals not met: Pt requiring min A for sit to stand out of car due to low surface.  Recommendation:  Patient will benefit from ongoing skilled PT services in home health setting to continue to advance safe functional mobility, address ongoing impairments in gait, strength, balance, endurance, safety with mobility, stairs, cognition, and minimize fall risk.  Equipment: Rollator]  Reasons for discharge: treatment goals met and discharge from hospital  Patient/family agrees with progress made and goals achieved: Yes  PT Discharge Precautions/Restrictions Precautions Precautions: Fall Required Braces or Orthoses: Sling (For comfort) Restrictions Weight Bearing Restrictions: No RUE Weight Bearing: Weight bearing as tolerated RLE Weight Bearing: Weight bearing as tolerated LLE Weight Bearing: Weight bearing as tolerated Cognition Overall Cognitive Status: History of cognitive impairments - at baseline Orientation Level: Oriented to person;Oriented to place Memory: Impaired Memory Impairment: Retrieval deficit;Decreased recall of new information;Decreased short term memory (h/o dementia) Awareness: Impaired Awareness Impairment: Intellectual impairment Safety/Judgment: Impaired Comments: Pt with h/o cognitive  impairments. Requires cues for memory, safety, and sequencing. Husband is able to provide appropriate cues as needed. Sensation Sensation Light Touch: Appears Intact Proprioception: Appears Intact Coordination Gross Motor Movements are Fluid and Coordinated: Yes Motor  Motor Motor: Other (comment) (generalized weakness; pain limiting)  Locomotion  Ambulation Ambulation/Gait Assistance: 5: Supervision  Trunk/Postural Assessment  Cervical Assessment Cervical Assessment: Exceptions to [facility] (forward head) Thoracic Assessment Thoracic Assessment:  (kyphotic posture) Lumbar Assessment Lumbar Assessment: Exceptions to [facility] (posterior pelvic tilt) Postural Control Postural Control: Within Functional Limits  Balance Balance Balance Assessed: Yes Static Sitting Balance Static Sitting - Level of Assistance: 5: Stand by assistance;6: Modified independent (Device/Increase time) Dynamic Sitting Balance Dynamic Sitting - Level of Assistance: 5: Stand by assistance Static Standing Balance Static Standing - Balance Support: During functional activity Static Standing - Level of Assistance: 5: Stand by assistance Dynamic Standing Balance Dynamic Standing - Balance Support: During functional activity Dynamic Standing - Level of Assistance: 5: Stand by assistance Extremity Assessment      RLE Assessment RLE Assessment: Exceptions to [facility] RLE Strength RLE Overall Strength Comments: Grossly 3+ to 4-/5 functionally; pain limits at times LLE Assessment LLE Assessment: Exceptions to [facility] LLE Strength LLE Overall Strength Comments: Grossly 3+ to 4-/5 functionally  See FIM for current functional status  03/28/2014, 4:03 PM   Lars Masson, PT, DPT

## 2014-03-28 NOTE — Progress Notes (Addendum)
Physical Therapy Session Note  Patient Details  Name: Denise [name] MRN: [id number] Date of Birth: [birth year]  Today's Date: 03/28/2014 PT Individual Time: 0900-1000 PT Individual Time Calculation (min): 60 min   Short Term Goals: Week 2:  PT Short Term Goal 1 (Week 2): = LTG of supervision overall except min A stairs  Skilled Therapeutic Interventions/Progress Updates:   Session focused on family education with pt and pt's husband in preparation for discharge home tomorrow. Therapist demonstrated and then had husband return demonstrate basic transfers (including cues for locking brakes on rollator), stair negotiation (up down 4 steps x 2 (seated break in between) with R rail to simulate home entry), and car transfer to sedan height. Pt requiring increased physical A for sit to stand out of car (min A) compared to previous trials but pt's husband able to provide appropriately. Cues for husbands hand placement (encouraged more min A on stairs than S) to assist if needed during stair negotiation and pt requiring extra time and cueing for technique. Pt able to gait on unit with rollator x 155' with S for safety for overall endurance and strengthening. Encouraged pt's husband to practice stairs again this PM PT session as pt had more difficulty today compared to previous times practiced but both were able to complete safely. Left in w/c with safety belt donned and husband in room.  Therapy Documentation Precautions:  Precautions Precautions: Fall Precaution Comments:   Required Braces or Orthoses: Sling Restrictions Weight Bearing Restrictions: Yes RUE Weight Bearing: Weight bearing as tolerated RLE Weight Bearing: Weight bearing as tolerated LLE Weight Bearing: Weight bearing as tolerated  Pain:  c/o pain in R shoulder and RLE pain- premedicated and rest breaks as needed.   See FIM for current functional status  Therapy/Group: Individual Therapy  [name], Seva Chancy [facility] 03/28/2014, 10:08  AM

## 2014-03-28 NOTE — Progress Notes (Signed)
Occupational Therapy Session Note  Patient Details  Name: Denise [name] MRN: [id number] Date of Birth: May 14, 1940  Today's Date: 03/28/2014 OT Individual Time: 1100-1200 OT Individual Time Calculation (min): 60 min    Short Term Goals: Week 2:  OT Short Term Goal 1 (Week 2): STG=LTG secondary to ELOS  Skilled Therapeutic Interventions/Progress Updates:    Pt seen for ADL retraining with focus on functional transfers, cognitive remediation, family education/training, and activity tolerance. Pt received sitting in w/c requesting to bathe at sink despite encouragement from therapist and husband to shower. Pt oriented x4 with min cues and use of external aids. Pt completed bathing with min A for B feet and supervision for standing balance. Donned clothing with increased time due to fatigue and min A. Pt required mod cues for sustained attention and sequencing during bathing and dressing. Pt requesting to toilet therefore ambulated room<>bathroom at supervision level using rollator. Practiced tub transfer with TTB and ambulating in/out of bathroom with husband safely returning demonstration. Discussed therapy goals of supervision level and occasional min A for LB self-care. Emphasized 24/7 supervision and close supervision during all functional mobility. Husband verbalized understanding. Discussed DME and husband reporting already have required DME at this time. Pt and husband with no question or concerns in regards to d/c.  Therapy Documentation Precautions:  Precautions Precautions: Fall Precaution Comments:   Required Braces or Orthoses: Sling (For comfort) Restrictions Weight Bearing Restrictions: No RUE Weight Bearing: Weight bearing as tolerated RLE Weight Bearing: Weight bearing as tolerated LLE Weight Bearing: Weight bearing as tolerated General:   Vital Signs:  Pain: Pain Assessment Pain Assessment: No/denies pain Pain Score: 0-No pain   See FIM for current functional  status  Therapy/Group: Individual Therapy  Daneil Danerkinson, Lusia Greis N 03/28/2014, 12:06 PM

## 2014-03-28 NOTE — Plan of Care (Signed)
Problem: RH SAFETY Goal: RH STG ADHERE TO SAFETY PRECAUTIONS W/ASSISTANCE/DEVICE STG Adhere to Safety Precautions With min Assistance/Device.  Outcome: Not Progressing Frequent trips up without assistance

## 2014-03-28 NOTE — Progress Notes (Signed)
Physical Therapy Session Note  Patient Details  Name: Denise [name] MRN: [id number] Date of Birth: 1940/12/02  Today's Date: 03/28/2014 PT Individual Time: 1500-1530 PT Individual Time Calculation (min): 30 min   Short Term Goals: Week 2:  PT Short Term Goal 1 (Week 2): = LTG of supervision overall except min A stairs  Skilled Therapeutic Interventions/Progress Updates:    Pt finishing toileting in the bathroom with NT handoff performing clothing management and gait and hand hygiene at S level using rollator. Session focused on functional gait with rollator on unit x 150' x 2 for functional strength and endurance at S level for safety and simulated car transfer in preparation for d/c tomorrow. Pt S for transfer into the car, but still requires min A for sit to stand out of car due to lower surface with cues for hand placement and technique.   Therapy Documentation Precautions:  Precautions Precautions: Fall Precaution Comments:   Required Braces or Orthoses: Sling (For comfort) Restrictions Weight Bearing Restrictions: No RUE Weight Bearing: Weight bearing as tolerated RLE Weight Bearing: Weight bearing as tolerated LLE Weight Bearing: Weight bearing as tolerated  Pain: C/o pain in R "haunch" throughout session - rest breaks as needed.  See FIM for current functional status  Therapy/Group: Individual Therapy  [name], [name] 03/28/2014, 3:43 PM

## 2014-03-28 NOTE — Plan of Care (Signed)
Problem: RH Car Transfers Goal: LTG Patient will perform car transfers with assist (PT) LTG: Patient will perform car transfers with assistance (PT).  Outcome: Adequate for Discharge S for transfer into the car, but min A for transfer out of car due to low surface.

## 2014-03-28 NOTE — Progress Notes (Signed)
Physical Therapy Session Note  Patient Details  Name: Denise [name] MRN: [id number] Date of Birth: 1941/01/07  Today's Date: 03/28/2014 PT Individual Time: 1345-1423 PT Individual Time Calculation (min): 38 min   Short Term Goals: Week 2:  PT Short Term Goal 1 (Week 2): = LTG of supervision overall except min A stairs  Skilled Therapeutic Interventions/Progress Updates:  Patient sitting in wheelchair upon entering room. Session focused on family education with husband. Patient ambulated 150 - 200 feet x 2 with rollator and supervision. [name] appropriately cued patient to lock brakes on rollator throughout session. Patient ambulated up and down 4 steps sideways using 1 handrail on right ascending with husband's close supervision and cueing. Patient performed chair to bed transfer on regular double bed and demonstrated rolling to right and to left and supine <> sit with supervision and occasional cueing/coaxing and encouragement. Patient performed toileting tasks (pants down, up and hygiene) with supervision/cueing to lock rollator. Patient left in wheelchair with quick release belt in place and all items in reach.  Therapy Documentation Precautions:  Precautions Precautions: Fall Precaution Comments:   Required Braces or Orthoses: Sling (For comfort) Restrictions Weight Bearing Restrictions: No RUE Weight Bearing: Weight bearing as tolerated RLE Weight Bearing: Weight bearing as tolerated LLE Weight Bearing: Weight bearing as tolerated  Pain: Pain Assessment Pain Assessment: 0-10 Pain Score: 5  Pain Location: Pelvis Pain Orientation: Right Locomotion : Ambulation Ambulation/Gait Assistance: 5: Supervision  Balance: Balance Balance Assessed: Yes Static Sitting Balance Static Sitting - Level of Assistance: 5: Stand by assistance;6: Modified independent (Device/Increase time) Dynamic Sitting Balance Dynamic Sitting - Level of Assistance: 5: Stand by  assistance Static Standing Balance Static Standing - Balance Support: During functional activity Static Standing - Level of Assistance: 5: Stand by assistance Dynamic Standing Balance Dynamic Standing - Balance Support: During functional activity Dynamic Standing - Level of Assistance: 5: Stand by assistance  See FIM for current functional status  Therapy/Group: Individual Therapy  Arelia LongestWindsor, Abisai Deer M 03/28/2014, 2:32 PM
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
## 2014-03-29 ENCOUNTER — Inpatient Hospital Stay (HOSPITAL_COMMUNITY): Payer: Medicare Other

## 2014-03-29 LAB — GLUCOSE, CAPILLARY: Glucose-Capillary: 81 mg/dL (ref 70–99)

## 2014-03-29 MED ORDER — POLYETHYLENE GLYCOL 3350 17 G PO PACK: 17.0000 g | PACK | Freq: Every day | ORAL | Status: DC

## 2014-03-29 MED ORDER — TRAMADOL HCL 50 MG PO TABS: 25.0000 mg | ORAL_TABLET | Freq: Three times a day (TID) | ORAL | Status: DC | PRN

## 2014-03-29 MED ORDER — STUDY - INVESTIGATIONAL DRUG SIMPLE RECORD: 100.0000 mg | Freq: Every day | Status: DC

## 2014-03-29 MED ORDER — OXYBUTYNIN CHLORIDE 5 MG PO TABS: 5.0000 mg | ORAL_TABLET | Freq: Two times a day (BID) | ORAL | Status: DC

## 2014-03-29 MED ORDER — STUDY - INVESTIGATIONAL DRUG SIMPLE RECORD: 125.0000 mg | Freq: Every day | Status: DC

## 2014-03-29 MED ORDER — GLYBURIDE 5 MG PO TABS: 2.5000 mg | ORAL_TABLET | Freq: Two times a day (BID) | ORAL | Status: DC

## 2014-03-29 NOTE — Progress Notes (Signed)
Occupational Therapy Discharge Summary  Patient Details  Name: Denise [name] MRN: [id number] Date of Birth: 10/18/1940   Patient has met 10 of 10 long term goals due to improved activity tolerance, improved balance, postural control, ability to compensate for deficits, improved attention, improved awareness and improved coordination.  Pt made steady progress with BADLs during this admission.  Pt continues to require mod A for orientation secondary to dementia.  Pt's husband has been present for therapy sessions and will provide 24 hour supervision after discharge. Patient to discharge at overall Supervision level.  Patient's care partner is independent to provide the necessary physical and cognitive assistance at discharge.     Recommendation:  Patient will benefit from ongoing skilled OT services in home health setting to continue to advance functional skills in the area of BADLs, safety awareness, cognition, minimize fall risk, balance, strength.  Equipment: No equipment provided Pt owns tub transfer bench and has elevated toilets  Reasons for discharge: treatment goals met and discharge from hospital  Patient/family agrees with progress made and goals achieved: Yes  OT Discharge ADL ADL ADL Comments: see FIm Vision/Perception  Vision- History Baseline Vision/History: Wears glasses Wears Glasses: Reading only Patient Visual Report: No change from baseline Vision- Assessment Vision Assessment?: No apparent visual deficits  Cognition Overall Cognitive Status: History of cognitive impairments - at baseline Arousal/Alertness: Awake/alert Orientation Level: Oriented to person;Disoriented to place;Disoriented to time;Disoriented to situation Attention: Sustained Sustained Attention: Impaired Sustained Attention Impairment: Verbal basic;Functional basic Memory: Impaired Memory Impairment: Retrieval deficit;Decreased recall of new information;Decreased short term memory Decreased  Long Term Memory: Verbal basic;Functional basic Decreased Short Term Memory: Functional basic;Verbal basic Awareness: Impaired Awareness Impairment: Intellectual impairment Problem Solving: Impaired Problem Solving Impairment: Functional basic;Verbal basic Comments: Pt with h/o cognitive impairments. Requires cues for memory, safety, and sequencing. Husband is able to provide appropriate cues as needed. Sensation Sensation Light Touch: Appears Intact Hot/Cold: Appears Intact Proprioception: Appears Intact Coordination Gross Motor Movements are Fluid and Coordinated: Yes Fine Motor Movements are Fluid and Coordinated: Yes Motor  Motor Motor: Within Functional Limits Trunk/Postural Assessment  Cervical Assessment Cervical Assessment: Exceptions to WFL (forward head) Thoracic Assessment Thoracic Assessment: Within Functional Limits Lumbar Assessment Lumbar Assessment: Exceptions to WFL (posterior pelvic tilt) Postural Control Postural Control: Within Functional Limits  Balance Dynamic Sitting Balance Dynamic Sitting - Level of Assistance: 5: Stand by assistance Extremity/Trunk Assessment RUE Assessment RUE Assessment: Exceptions to WFL (decreased AROM secondary increased pain and clavicle fx) LUE Assessment LUE Assessment: Within Functional Limits  See FIM for current functional status  Lanier, Thomas Chappell 03/29/2014, 8:15 AM  

## 2014-03-29 NOTE — Progress Notes (Signed)
Occupational Therapy Session Note  Patient Details  Name: Denise CarinaBarbara M Fassnacht MRN: [id number] Date of Birth: 1940-08-04  Today's Date: 03/29/2014 OT Individual Time: 0700-0745 OT Individual Time Calculation (min): 45 min    Short Term Goals: Week 2:  OT Short Term Goal 1 (Week 2): STG=LTG secondary to ELOS  Skilled Therapeutic Interventions/Progress Updates:    Pt resting in bed upon arrival and agreeable to BADL retraining including bathing at shower level and dressing with sit<>stand from EOB.  Pt continues to required tot A for orientation and max A to use visual cues.  Pt amb with RW to bathroom to use toilet before transferring to shower for bathing.  Pt used long handle sponge to assist with bathing feet.  Pt amb with RW back to EOB to complete dressing tasks.  Pt completed all dressing tasks with supervision, propping feet on EOB to son socks and shoes.  Pt states this the way she does it at home.  Focus on activity tolerance, sit<>stand, standing balance, functional amb with RW, functional transfers, and safety awareness.  Therapy Documentation Precautions:  Precautions Precautions: Fall Precaution Comments:   Required Braces or Orthoses: Sling (For comfort) Restrictions Weight Bearing Restrictions: No RUE Weight Bearing: Weight bearing as tolerated RLE Weight Bearing: Weight bearing as tolerated LLE Weight Bearing: Weight bearing as tolerated Pain: Pain Assessment Pain Assessment: Faces Faces Pain Scale: Hurts a little bit Pain Type: Acute pain Pain Location: Back Pain Orientation: Lower Pain Descriptors / Indicators: Aching Pain Onset: Gradual Pain Intervention(s): RN made aware;Repositioned  See FIM for current functional status  Therapy/Group: Individual Therapy  Rich BraveLanier, Valen Mascaro Chappell 03/29/2014, 7:54 AM

## 2014-03-29 NOTE — Discharge Instructions (Signed)
Inpatient Rehab Discharge Instructions  Denise CarinaBarbara M Gilkeson Discharge date and time:  03/29/14  Activities/Precautions/ Functional Status: Activity: activity as tolerated with assistance Diet: diabetic diet Wound Care: none needed Functional status:  ___ No restrictions     ___ Walk up steps independently _X__ 24/7 supervision/assistance   ___ Walk up steps with assistance ___ Intermittent supervision/assistance  ___ Bathe/dress independently ___ Walk with walker      _X__ Bathe/dress with assistance ___ Walk Independently    ___ Shower independently _X__ Walk with supervision                ___ Shower with assistance ___ No alcohol     ___ Return to work/school ________    COMMUNITY REFERRALS UPON DISCHARGE:    Home Health:   PT     OT     RN                       Agency:  [facility] Phone: [phone]   Medical Equipment/Items Ordered: rollator walker                                                     Agency/Supplier:  [facility]   GENERAL COMMUNITY RESOURCES FOR PATIENT/FAMILY:  Caregiver Support:  Caregiver Support Group (handout)    Special Instructions:    My questions have been answered and I understand these instructions. I will adhere to these goals and the provided educational materials after my discharge from the hospital.  Patient/Caregiver Signature _______________________________ Date __________  Clinician Signature _______________________________________ Date __________  Please bring this form and your medication list with you to all your follow-up doctor's appointments.
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
## 2014-04-01 NOTE — Progress Notes (Signed)
Social Work  Discharge Note  The overall goal for the admission was met for:   Discharge location: Yes - home with husband to provide 24/7 assistance  Length of Stay: Yes - 16 days  Discharge activity level: Yes - supervision  Home/community participation: Yes  Services provided included: MD, RD, PT, OT, SLP, RN, TR, Pharmacy and [place]: Medicare  Follow-up services arranged: Home Health: RN, PT, OT via [place], DME: rollator walker via [place] and Patient/Family has no preference for HH/DME agencies  Comments (or additional information):  Patient/Family verbalized understanding of follow-up arrangements: Yes  Individual responsible for coordination of the follow-up plan: spouse  Confirmed correct DME delivered: Lennart Pall 04/01/2014    [name]

## 2014-04-04 ENCOUNTER — Telehealth: Payer: Self-pay | Admitting: *Deleted
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
## 2014-04-05 ENCOUNTER — Telehealth: Payer: Self-pay

## 2014-04-05 MED ORDER — DICLOFENAC SODIUM 1 % TD GEL: 4.0000 g | Freq: Four times a day (QID) | TRANSDERMAL | Status: DC

## 2014-04-05 NOTE — Telephone Encounter (Signed)
If the pain is persistent more than 3 days then we can order a portable xr of her hip. Mrs. Denise [name] has severe memory loss, so we need to be careful about how we interpret symptoms, complaints, etc

## 2014-04-05 NOTE — Telephone Encounter (Signed)
Denise [name] calls; [facility]; [facility]; Denise [name] thinks pts lt cheek muscle of buttock is very tender and sore and thinks related to overdoing exercises;request Pennsaid or diclofenac topical solution to [facility] for muscle pain. Denise [name] request to be done today, Nicki Reaperegina Baity NP is out of office .Please advise. Denise [name] request cb.

## 2014-04-05 NOTE — Telephone Encounter (Signed)
eRx sent.  Please make sure she follows up with PCP next week.  Route to PCP as Lorain ChildesFYI

## 2014-04-05 NOTE — Telephone Encounter (Signed)
Notified Erin to let us know Monday if it is felt she still needs xray

## 2014-04-09 ENCOUNTER — Ambulatory Visit: Payer: Medicare Other | Admitting: Internal Medicine

## 2014-04-09 ENCOUNTER — Telehealth: Payer: Self-pay | Admitting: *Deleted

## 2014-04-09 NOTE — Telephone Encounter (Signed)
Denise [name] has called back about Denise [name]'s hip pain.  Asking for a VO to get a portable in the home. As per Dr Laruth BouchardSwarta note about if pain persists for 3 days we can order, I will give the verbal order. She is asking if we can increase her tramadol as well.  Right now she can take 1/2-1 tab q 8 prn but only gets #60.  Can we increase her?

## 2014-04-10 MED ORDER — TRAMADOL HCL 50 MG PO TABS: 50.0000 mg | ORAL_TABLET | Freq: Three times a day (TID) | ORAL | Status: DC | PRN

## 2014-04-10 NOTE — Telephone Encounter (Signed)
Notified Erin that tramadol was increased and called to pharmacy.

## 2014-04-10 NOTE — Telephone Encounter (Signed)
May have portable. Dr. Laruth BouchardSwarta also will allow 1 q8 prn Tramadol #90.

## 2014-04-17 DIAGNOSIS — Z794 Long term (current) use of insulin: Secondary | ICD-10-CM

## 2014-04-17 DIAGNOSIS — S329XXD Fracture of unspecified parts of lumbosacral spine and pelvis, subsequent encounter for fracture with routine healing: Secondary | ICD-10-CM | POA: Diagnosis not present

## 2014-04-17 DIAGNOSIS — S42009D Fracture of unspecified part of unspecified clavicle, subsequent encounter for fracture with routine healing: Secondary | ICD-10-CM | POA: Diagnosis not present

## 2014-04-17 DIAGNOSIS — M1990 Unspecified osteoarthritis, unspecified site: Secondary | ICD-10-CM

## 2014-04-17 DIAGNOSIS — S2241XD Multiple fractures of ribs, right side, subsequent encounter for fracture with routine healing: Secondary | ICD-10-CM | POA: Diagnosis not present

## 2014-04-17 DIAGNOSIS — I10 Essential (primary) hypertension: Secondary | ICD-10-CM

## 2014-04-17 DIAGNOSIS — E119 Type 2 diabetes mellitus without complications: Secondary | ICD-10-CM | POA: Diagnosis not present

## 2014-04-17 DIAGNOSIS — I4891 Unspecified atrial fibrillation: Secondary | ICD-10-CM

## 2014-04-17 DIAGNOSIS — F0390 Unspecified dementia without behavioral disturbance: Secondary | ICD-10-CM

## 2014-05-23 ENCOUNTER — Other Ambulatory Visit: Payer: Self-pay | Admitting: Physical Medicine and Rehabilitation

## 2014-05-23 ENCOUNTER — Telehealth: Payer: Self-pay

## 2014-05-23 ENCOUNTER — Other Ambulatory Visit: Payer: Self-pay | Admitting: Internal Medicine

## 2014-05-23 NOTE — Telephone Encounter (Signed)
[facility] request cb to verify hand fax received for oxybutinin; just has Nicki Reaperegina Baity NP signature and [facility]Christy at [facility] wants to know if refills were authorized. Please advise.

## 2014-05-24 ENCOUNTER — Other Ambulatory Visit: Payer: Self-pay | Admitting: *Deleted

## 2014-05-24 MED ORDER — OXYBUTYNIN CHLORIDE 5 MG PO TABS: 5.0000 mg | ORAL_TABLET | Freq: Two times a day (BID) | ORAL | Status: DC

## 2014-05-24 MED ORDER — RANITIDINE HCL 150 MG PO TABS: 150.0000 mg | ORAL_TABLET | Freq: Two times a day (BID) | ORAL | Status: DC

## 2014-05-24 NOTE — Addendum Note (Signed)
Addended by: Damita LackLORING, DONNA S on: 05/24/2014 01:37 PM   Modules accepted: Orders

## 2014-05-24 NOTE — Telephone Encounter (Signed)
Yes 5 refills. 

## 2014-05-24 NOTE — Telephone Encounter (Signed)
Rx sent in electronically.  

## 2014-05-24 NOTE — Telephone Encounter (Signed)
Mr Dwain Sarnaicke wanted status of ranitidine refill; advised done.

## 2014-05-31 ENCOUNTER — Other Ambulatory Visit: Payer: Self-pay | Admitting: Internal Medicine

## 2014-05-31 NOTE — Telephone Encounter (Signed)
rx refilled electronically x 2 refills, pt has f/u scheduled 06/25/14.

## 2014-06-25 ENCOUNTER — Ambulatory Visit: Payer: Medicare Other | Admitting: Internal Medicine

## 2014-06-25 ENCOUNTER — Telehealth: Payer: Self-pay | Admitting: Internal Medicine

## 2014-06-25 ENCOUNTER — Other Ambulatory Visit: Payer: Self-pay

## 2014-06-25 MED ORDER — OXYBUTYNIN CHLORIDE 5 MG PO TABS
5.0000 mg | ORAL_TABLET | Freq: Two times a day (BID) | ORAL | Status: DC
Start: 2014-06-25 — End: 2014-10-28

## 2014-06-25 NOTE — Telephone Encounter (Signed)
Denise LongsJoseph called checking on a rx that regina was discussing with him about ms Shearn not taking any more. He stated [facility] called telling him a rx was  ready.   Please call to discuss with him

## 2014-06-25 NOTE — Telephone Encounter (Signed)
Spoke woth Mr Dwain Sarnaicke and he is aware to reschedule pt's f/u to discuss

## 2014-07-15 ENCOUNTER — Ambulatory Visit: Payer: Medicare Other | Admitting: Internal Medicine

## 2014-07-15 DIAGNOSIS — Z0289 Encounter for other administrative examinations: Secondary | ICD-10-CM

## 2014-08-12 ENCOUNTER — Telehealth: Payer: Self-pay

## 2014-08-12 NOTE — Telephone Encounter (Signed)
Phone line was busy when I tried to call about scheduling a Mammogram.

## 2014-08-29 ENCOUNTER — Other Ambulatory Visit: Payer: Self-pay | Admitting: Internal Medicine

## 2014-08-29 NOTE — Telephone Encounter (Signed)
I do not see where you have filled this--please advise if okay to refill and confirm dose

## 2014-10-13 ENCOUNTER — Other Ambulatory Visit: Payer: Self-pay | Admitting: Internal Medicine

## 2014-10-14 ENCOUNTER — Other Ambulatory Visit: Payer: Self-pay | Admitting: Internal Medicine

## 2014-10-14 NOTE — Telephone Encounter (Signed)
Pt needs to be seen for an appt.

## 2014-10-14 NOTE — Telephone Encounter (Signed)
pts last A1C 9

## 2014-10-15 ENCOUNTER — Other Ambulatory Visit: Payer: Self-pay | Admitting: Obstetrics & Gynecology

## 2014-10-15 ENCOUNTER — Other Ambulatory Visit: Payer: Self-pay | Admitting: Internal Medicine

## 2014-10-17 ENCOUNTER — Telehealth: Payer: Self-pay | Admitting: Internal Medicine

## 2014-10-17 NOTE — Telephone Encounter (Signed)
[name] nickie called to ask for pt to be transferred to Dr Dayton Martes or another md. [facility] states that this was per [facility] request. Pleas let me know if I can switch her and how soon I can make an appt. [name] states that she would like to have a consult visit and discuss medications and other things. Thanks cb number is [phone]

## 2014-10-17 NOTE — Telephone Encounter (Signed)
I am not currently accepting new patients.  °

## 2014-10-17 NOTE — Telephone Encounter (Signed)
That is fine with me. Already past due for follow up appt

## 2014-10-24 ENCOUNTER — Ambulatory Visit (INDEPENDENT_AMBULATORY_CARE_PROVIDER_SITE_OTHER): Payer: Medicare Other | Admitting: Internal Medicine

## 2014-10-24 ENCOUNTER — Encounter: Payer: Self-pay | Admitting: Internal Medicine

## 2014-10-24 VITALS — BP 134/70 | HR 75 | Temp 98.7°F | Ht 63.0 in | Wt 194.0 lb

## 2014-10-24 DIAGNOSIS — Z23 Encounter for immunization: Secondary | ICD-10-CM | POA: Diagnosis not present

## 2014-10-24 DIAGNOSIS — I10 Essential (primary) hypertension: Secondary | ICD-10-CM

## 2014-10-24 DIAGNOSIS — M1990 Unspecified osteoarthritis, unspecified site: Secondary | ICD-10-CM

## 2014-10-24 DIAGNOSIS — D72829 Elevated white blood cell count, unspecified: Secondary | ICD-10-CM

## 2014-10-24 DIAGNOSIS — N3281 Overactive bladder: Secondary | ICD-10-CM

## 2014-10-24 DIAGNOSIS — E785 Hyperlipidemia, unspecified: Secondary | ICD-10-CM | POA: Diagnosis not present

## 2014-10-24 DIAGNOSIS — J4520 Mild intermittent asthma, uncomplicated: Secondary | ICD-10-CM

## 2014-10-24 DIAGNOSIS — Z794 Long term (current) use of insulin: Secondary | ICD-10-CM

## 2014-10-24 DIAGNOSIS — Z0000 Encounter for general adult medical examination without abnormal findings: Secondary | ICD-10-CM | POA: Diagnosis not present

## 2014-10-24 DIAGNOSIS — E559 Vitamin D deficiency, unspecified: Secondary | ICD-10-CM

## 2014-10-24 DIAGNOSIS — F0390 Unspecified dementia without behavioral disturbance: Secondary | ICD-10-CM

## 2014-10-24 DIAGNOSIS — M810 Age-related osteoporosis without current pathological fracture: Secondary | ICD-10-CM

## 2014-10-24 DIAGNOSIS — E119 Type 2 diabetes mellitus without complications: Secondary | ICD-10-CM

## 2014-10-24 DIAGNOSIS — I480 Paroxysmal atrial fibrillation: Secondary | ICD-10-CM | POA: Diagnosis not present

## 2014-10-24 DIAGNOSIS — H9190 Unspecified hearing loss, unspecified ear: Secondary | ICD-10-CM

## 2014-10-24 DIAGNOSIS — K219 Gastro-esophageal reflux disease without esophagitis: Secondary | ICD-10-CM

## 2014-10-24 NOTE — Progress Notes (Signed)
HPI:  Pt presents to the clinic today for her medicare wellness exam. She is also due for follow up of chronic conditions.  Asthma/COPD: She reports she is breathing fine. She does not really use the Albuterol. She has not had PFT's and she does not follow with a pulmonologist.  GERD: She takes Zantac daily. She denies breakthrough symptoms.  Arthritis: She is not currently complaining of any pain. She has Tramadol to take only when needed.  DM 2: Her last A1C was 9%. Her fasting sugars are ranging 119-150. She takes Metformin, Glyburide, Tradjenta and Lantus (37 units daily). Her last eye exam was within the last year with Dr. Gershon Crane. Flu 10/2013. Pneumovax never. Prevnar 02/2013.  HTN: Her BP is well controlled on Ramipril. Her BP today is 134/70. ECG from 02/2014 reviewed.  HLD: Her last LDL was 119. She is not on any cholesterol lowering medication. She does try to consume a low fat diet.  Afib: Paroxysmal. She is not on any medications for this. ASA only for antiocoag.  Osteopenia: Bone Density from 2012 reviewed. She did have evidence of Osteoporosis. She is on Fosamax.  OAB: Symptoms controlled on Ditropan.  Memory Impairment: Her sone is concerned about worsening memory impairment. She has terrible short term memory. She can not cook for herself. She needs help dressing and bathing. She has someone administer her medications for her. She no longer drives. She takes Aricept and Namenda but her family has not noticed a difference.  Past Medical History  Diagnosis Date  . Asthma   . COPD (chronic obstructive pulmonary disease) ([place])   . GERD (gastroesophageal reflux disease)   . Arthritis   . Diabetes mellitus   . Hyperlipidemia   . Hypertension   . Hx of atrial fibrillation, no current medication   . Osteopenia   . Asthma   . Cataracts, bilateral 12/2010  . Plantar fasciitis 2010  . Carpal tunnel syndrome 09/2006    bilateral     Current Outpatient Prescriptions   Medication Sig Dispense Refill  . ACCU-CHEK SMARTVIEW test strip     . albuterol (PROVENTIL HFA;VENTOLIN HFA) 108 (90 BASE) MCG/ACT inhaler Inhale 2 puffs into the lungs every 6 (six) hours as needed. 4 Inhaler 3  . alendronate (FOSAMAX) 70 MG tablet TAKE ONE TABLET BY MOUTH ONCE A WEEK 12 tablet 0  . aspirin 325 MG tablet Take 325 mg by mouth daily.      . BD PEN NEEDLE NANO U/F 32G X 4 MM MISC USE DAILY AS DIRECTED 100 each 6  . Blood Glucose Monitoring Suppl (ACCU-CHEK NANO SMARTVIEW) W/DEVICE KIT by Does not apply route.    . Calcium Carbonate-Vitamin D (CALTRATE 600+D PO) Take by mouth 2 (two) times daily.      . diclofenac sodium (VOLTAREN) 1 % GEL Apply 4 g topically 4 (four) times daily. 100 g 0  . donepezil (ARICEPT) 23 MG TABS tablet TAKE 1 TABLET BY MOUTH ATBEDTIME. 90 tablet 1  . fish oil-omega-3 fatty acids 1000 MG capsule Take 2 g by mouth daily.      Marland Kitchen glyBURIDE (DIABETA) 5 MG tablet TAKE ONE TABLET BY MOUTH TWICE DAILY 30 tablet 3  . LANTUS SOLOSTAR 100 UNIT/ML Solostar Pen INJECT 36 UNITS INTO THE SKIN AT BEDTIME 1 pen 6  . memantine (NAMENDA) 5 MG tablet TAKE 1 TABLET BY MOUTH DAILY. 90 tablet 2  . metFORMIN (GLUCOPHAGE) 500 MG tablet TAKE 1 TABLET (500 MG TOTAL) BY MOUTH 2 (TWO) TIMES  DAILY WITH A MEAL. 60 tablet 0  . multivitamin (THERAGRAN) per tablet Take 1 tablet by mouth daily.      Marland Kitchen oxybutynin (DITROPAN) 5 MG tablet Take 1 tablet (5 mg total) by mouth 2 (two) times daily. 180 tablet 1  . polyethylene glycol (MIRALAX / GLYCOLAX) packet Take 17 g by mouth daily. For constipation. 14 each 0  . Potassium 99 MG TABS Take by mouth 2 (two) times daily.      . ramipril (ALTACE) 10 MG capsule TAKE 1 CAPSULE BY MOUTH ONCE A DAY 90 capsule 3  . ranitidine (ZANTAC) 150 MG tablet TAKE 1 TABLET BY MOUTH TWICE A DAY 60 tablet 3  . research study medication Take 100 mg by mouth daily.    . research study medication Take 125 mg by mouth daily.    . TRADJENTA 5 MG TABS tablet TAKE 1  TABLET BY MOUTH ONCE DAILY 90 tablet 1  . traMADol (ULTRAM) 50 MG tablet Take 1 tablet (50 mg total) by mouth every 8 (eight) hours as needed for moderate pain. 90 tablet 0  . Vitamin D, Ergocalciferol, (DRISDOL) 50000 UNITS CAPS TAKE ONE CAPSULE BY MOUTH EVERY OTHER WEEK 6 capsule 2   No current facility-administered medications for this visit.    Allergies  Allergen Reactions  . Codeine Other (See Comments)    unknown  . Sulfonamide Derivatives Other (See Comments)    unknown    Family History  Problem Relation Age of Onset  . Heart disease Mother   . Cancer Mother     ovarian  . Diabetes Neg Hx   . Stroke Neg Hx     Social History   Social History  . Marital Status: Married    Spouse Name: N/A  . Number of Children: N/A  . Years of Education: N/A   Occupational History  . Not on file.   Social History Main Topics  . Smoking status: Never Smoker   . Smokeless tobacco: Not on file  . Alcohol Use: No  . Drug Use: No  . Sexual Activity:    Partners: Male    Birth Control/ Protection: Post-menopausal   Other Topics Concern  . Not on file   Social History Narrative    Hospitiliaztions: None  Health Maintenance:    Flu: 10/2013  Tetanus: 02/2013  Pneumovax: unsure  Prevnar: 02/2013  Zostavax: not sure that she had chicken pox  Mammogram: 2014, no longer wants to screen  Pap Smear: 2009, no longer wants to screen  Bone Density: 2014  Colon Screening: 10/2012  Eye Doctor: yearly, Dr. Gershon Crane  Dental Exam: yearly   Providers:   PCP: Webb Silversmith, NP-C  Opthalmology: Dr. Gershon Crane   I have personally reviewed and have noted:  1. The patient's medical and social history 2. Their use of alcohol, tobacco or illicit drugs 3. Their current medications and supplements 4. The patient's functional ability including ADL's, fall risks, home safety risks and  hearing or visual impairment. 5. Diet and physical activities 6. Evidence for depression or mood  disorder  Subjective:   Review of Systems:   Constitutional: Denies fever, malaise, fatigue, headache or abrupt weight changes.  HEENT: Denies eye pain, eye redness, ear pain, ringing in the ears, wax buildup, runny nose, nasal congestion, bloody nose, or sore throat. Respiratory: Denies difficulty breathing, shortness of breath, cough or sputum production.   Cardiovascular: Denies chest pain, chest tightness, palpitations or swelling in the hands or feet.  Gastrointestinal: Denies  abdominal pain, bloating, constipation, diarrhea or blood in the stool.  GU: Pt reports urinary incontinence. Denies urgency, frequency, pain with urination, burning sensation, blood in urine, odor or discharge. Musculoskeletal: Denies decrease in range of motion, difficulty with gait, muscle pain or joint pain and swelling.  Skin: Denies redness, rashes, lesions or ulcercations.  Neurological: Pt reports difficulty with memory. Denies dizziness, difficulty with speech or problems with balance and coordination.  Psych: Denies anxiety, depression, SI/HI.  No other specific complaints in a complete review of systems (except as listed in HPI above).  Objective:  PE:  BP 134/70 mmHg  Pulse 75  Temp(Src) 98.7 F (37.1 C) (Oral)  Ht 5' 3" (1.6 m)  Wt 194 lb (87.998 kg)  BMI 34.37 kg/m2  SpO2 98%  LMP 01/19/1996  Wt Readings from Last 3 Encounters:  03/21/14 197 lb 8.5 oz (89.6 kg)  03/07/14 209 lb 7 oz (95 kg)  01/24/14 199 lb (90.266 kg)    General: Appears her stated age, chronically ill appearing, in NAD. Neck: Neck supple, trachea midline. No masses, lumps or thyromegaly present.  Cardiovascular: Normal rate and rhythm. S1,S2 noted.  No murmur, rubs or gallops noted. No JVD or BLE edema.  Pulmonary/Chest: Normal effort and positive vesicular breath sounds. No respiratory distress. No wheezes, rales or ronchi noted.  Abdomen: Soft and nontender.  Neurological: Oriented to person. She is very  confused. Psychiatric: Mood and affect normal.     BMET    Component Value Date/Time   NA 137 03/25/2014 0633   K 4.5 03/25/2014 0633   CL 104 03/25/2014 0633   CO2 27 03/25/2014 0633   GLUCOSE 82 03/25/2014 0633   BUN 18 03/25/2014 0633   CREATININE 0.43* 03/25/2014 0633   CALCIUM 9.1 03/25/2014 0633   GFRNONAA >90 03/25/2014 0633   GFRAA >90 03/25/2014 0633    Lipid Panel     Component Value Date/Time   CHOL 160 12/24/2013 1054   TRIG 158.0* 12/24/2013 1054   HDL 72.50 12/24/2013 1054   CHOLHDL 2 12/24/2013 1054   VLDL 31.6 12/24/2013 1054   LDLCALC 56 12/24/2013 1054    CBC    Component Value Date/Time   WBC 11.0* 03/25/2014 0633   RBC 4.27 03/25/2014 0633   HGB 13.5 03/25/2014 0633   HCT 41.0 03/25/2014 0633   PLT 365 03/25/2014 0633   MCV 96.0 03/25/2014 0633   MCH 31.6 03/25/2014 0633   MCHC 32.9 03/25/2014 0633   RDW 13.9 03/25/2014 0633   LYMPHSABS 2.1 03/14/2014 0600   MONOABS 1.2* 03/14/2014 0600   EOSABS 0.5 03/14/2014 0600   BASOSABS 0.0 03/14/2014 0600    Hgb A1C Lab Results  Component Value Date   HGBA1C 9.0* 03/04/2014      Assessment and Plan:   Medicare Annual Wellness Visit:  Diet: She eats what she wants, encouraged diabetic diet Physical activity: Sedentary Depression/mood screen: Negative (although family thinks she is anxious) Hearing: Deficit, will refer to audiology. Visual acuity: Grossly normal, performs annual eye exam  ADLs: Needs assist Fall risk: Moderate fall risk, 1 fall in the last 6 months, no injury Home safety: Good Cognitive evaluation: Deficits noted, on Aricept and Namenda EOL planning: Adv directives, DNR/ I agree  Preventative Medicine: Flu and Pneumovax today. Will refer to audiology. Discussed fall precautions. Will order bone density exam.   Next appointment: 6 months, follow up chronic conditions

## 2014-10-24 NOTE — Progress Notes (Signed)
Pre visit review using our clinic review tool, if applicable. No additional management support is needed unless otherwise documented below in the visit note. 

## 2014-10-25 ENCOUNTER — Other Ambulatory Visit: Payer: Self-pay | Admitting: Internal Medicine

## 2014-10-25 DIAGNOSIS — D72829 Elevated white blood cell count, unspecified: Secondary | ICD-10-CM

## 2014-10-25 DIAGNOSIS — I480 Paroxysmal atrial fibrillation: Secondary | ICD-10-CM | POA: Insufficient documentation

## 2014-10-25 DIAGNOSIS — N3281 Overactive bladder: Secondary | ICD-10-CM | POA: Insufficient documentation

## 2014-10-25 LAB — COMPREHENSIVE METABOLIC PANEL
ALK PHOS: 81 U/L (ref 39–117)
ALT: 15 U/L (ref 0–35)
AST: 14 U/L (ref 0–37)
Albumin: 3.9 g/dL (ref 3.5–5.2)
BUN: 14 mg/dL (ref 6–23)
CO2: 30 mEq/L (ref 19–32)
Calcium: 10 mg/dL (ref 8.4–10.5)
Chloride: 101 mEq/L (ref 96–112)
Creatinine, Ser: 0.6 mg/dL (ref 0.40–1.20)
GFR: 103.55 mL/min (ref >60.00)
Glucose, Bld: 172 mg/dL — ABNORMAL HIGH (ref 70–99)
POTASSIUM: 4.9 meq/L (ref 3.5–5.1)
Sodium: 138 mEq/L (ref 135–145)
TOTAL PROTEIN: 7.1 g/dL (ref 6.0–8.3)
Total Bilirubin: 0.6 mg/dL (ref 0.2–1.2)

## 2014-10-25 LAB — LIPID PANEL
Cholesterol: 218 mg/dL — ABNORMAL HIGH (ref 0–200)
HDL: 66.2 mg/dL (ref >39.00)
NonHDL: 151.58
TRIGLYCERIDES: 208 mg/dL — AB (ref 0.0–149.0)
Total CHOL/HDL Ratio: 3
VLDL: 41.6 mg/dL — ABNORMAL HIGH (ref 0.0–40.0)

## 2014-10-25 LAB — CBC
HEMATOCRIT: 45.1 % (ref 36.0–46.0)
HEMOGLOBIN: 14.9 g/dL (ref 12.0–15.0)
MCHC: 33.1 g/dL (ref 30.0–36.0)
MCV: 94.7 fl (ref 78.0–100.0)
Platelets: 263 10*3/uL (ref 150.0–400.0)
RBC: 4.76 Mil/uL (ref 3.87–5.11)
RDW: 13.4 % (ref 11.5–15.5)
WBC: 12.6 10*3/uL — ABNORMAL HIGH (ref 4.0–10.5)

## 2014-10-25 LAB — HEMOGLOBIN A1C: Hgb A1c MFr Bld: 8.4 % — ABNORMAL HIGH (ref 4.6–6.5)

## 2014-10-25 LAB — LDL CHOLESTEROL, DIRECT: Direct LDL: 118 mg/dL

## 2014-10-25 LAB — TSH: TSH: 1.72 u[IU]/mL (ref 0.35–4.50)

## 2014-10-25 LAB — VITAMIN D 25 HYDROXY (VIT D DEFICIENCY, FRACTURES): VITD: 23.76 ng/mL — ABNORMAL LOW (ref 30.00–100.00)

## 2014-10-25 NOTE — Assessment & Plan Note (Signed)
Well controlled on Ramipril Will check CBC and CMET today

## 2014-10-25 NOTE — Assessment & Plan Note (Signed)
No intervention needed Will continue to monitor 

## 2014-10-25 NOTE — Assessment & Plan Note (Signed)
Moderate cognitive and functional needs Getting assist from family Will discuss need for Aricept and Namenda with family

## 2014-10-25 NOTE — Assessment & Plan Note (Signed)
Regular today ASA only

## 2014-10-25 NOTE — Assessment & Plan Note (Signed)
Lipid Profile and CMET today If LDL not at goal, start statin therapy Continue ASA Encouraged her to consume a low fat diet

## 2014-10-25 NOTE — Assessment & Plan Note (Signed)
Will check A1C today No microalbumin secondary to ACEI therapy Foot exam today Pneumovax today Flu and Prevnar UTD Encouraged her to continue yearly eye exams Continue current medications unless directed otherwise

## 2014-10-25 NOTE — Assessment & Plan Note (Signed)
No issues on Zantac CMET today

## 2014-10-25 NOTE — Assessment & Plan Note (Signed)
Continue ditropan

## 2014-10-25 NOTE — Assessment & Plan Note (Signed)
Stop Voltaren daily Continue Tramadol prn

## 2014-10-25 NOTE — Patient Instructions (Signed)

## 2014-10-25 NOTE — Assessment & Plan Note (Signed)
Bone density ordered Continue Fosamax Will check Vit D today

## 2014-10-28 ENCOUNTER — Telehealth: Payer: Self-pay | Admitting: Internal Medicine

## 2014-10-28 MED ORDER — OXYBUTYNIN CHLORIDE 5 MG PO TABS: 5.0000 mg | ORAL_TABLET | Freq: Two times a day (BID) | ORAL | Status: DC

## 2014-10-28 MED ORDER — MEMANTINE HCL 5 MG PO TABS: 5.0000 mg | ORAL_TABLET | Freq: Every day | ORAL | Status: DC

## 2014-10-28 MED ORDER — METFORMIN HCL 500 MG PO TABS: 500.0000 mg | ORAL_TABLET | Freq: Two times a day (BID) | ORAL | Status: DC

## 2014-10-28 MED ORDER — DONEPEZIL HCL 23 MG PO TABS: 23.0000 mg | ORAL_TABLET | Freq: Every day | ORAL | Status: DC

## 2014-10-28 MED ORDER — GLYBURIDE 5 MG PO TABS: 5.0000 mg | ORAL_TABLET | Freq: Two times a day (BID) | ORAL | Status: DC

## 2014-10-28 MED ORDER — POTASSIUM 99 MG PO TABS: 1.0000 | ORAL_TABLET | Freq: Two times a day (BID) | ORAL | Status: DC

## 2014-10-28 MED ORDER — ALBUTEROL SULFATE HFA 108 (90 BASE) MCG/ACT IN AERS: 2.0000 | INHALATION_SPRAY | Freq: Four times a day (QID) | RESPIRATORY_TRACT | Status: AC | PRN

## 2014-10-28 MED ORDER — ALENDRONATE SODIUM 70 MG PO TABS: 70.0000 mg | ORAL_TABLET | ORAL | Status: DC

## 2014-10-28 MED ORDER — RAMIPRIL 10 MG PO CAPS: 10.0000 mg | ORAL_CAPSULE | Freq: Every day | ORAL | Status: AC

## 2014-10-28 MED ORDER — ATORVASTATIN CALCIUM 20 MG PO TABS: 20.0000 mg | ORAL_TABLET | Freq: Every day | ORAL | Status: DC

## 2014-10-28 MED ORDER — LINAGLIPTIN 5 MG PO TABS: 5.0000 mg | ORAL_TABLET | Freq: Every day | ORAL | Status: AC

## 2014-10-28 MED ORDER — POLYETHYLENE GLYCOL 3350 17 GM/SCOOP PO POWD: 17.0000 g | Freq: Every day | ORAL | Status: DC | PRN

## 2014-10-28 MED ORDER — RANITIDINE HCL 150 MG PO TABS: 150.0000 mg | ORAL_TABLET | Freq: Two times a day (BID) | ORAL | Status: DC

## 2014-10-28 MED ORDER — INSULIN GLARGINE 100 UNIT/ML SOLOSTAR PEN: 40.0000 [IU] | PEN_INJECTOR | Freq: Every day | SUBCUTANEOUS | Status: DC

## 2014-10-28 NOTE — Telephone Encounter (Signed)
[name] wanted to know about Denise [name] labs results Also she has questions about her med

## 2014-10-28 NOTE — Telephone Encounter (Signed)
See result note.  

## 2014-10-28 NOTE — Addendum Note (Signed)
Addended by: Littie Deeds Y on: 10/28/2014 05:00 PM   Modules accepted: Orders, Medications

## 2014-10-30 ENCOUNTER — Telehealth: Payer: Self-pay

## 2014-10-30 ENCOUNTER — Other Ambulatory Visit: Payer: Self-pay | Admitting: Internal Medicine

## 2014-10-30 MED ORDER — GLIPIZIDE 5 MG PO TABS: 5.0000 mg | ORAL_TABLET | Freq: Two times a day (BID) | ORAL | Status: DC

## 2014-10-30 NOTE — Telephone Encounter (Signed)
Incoming fax--Glyburide is no longer covered per pharmacy---looks like they mention alternative glimepiride or glipizide---please advise   Also I refilled the Potassium listed in chart,, pharmacy stated this does not exist--please correct and send to [facility]

## 2014-10-30 NOTE — Telephone Encounter (Signed)
Replaced Glyburide with Glipizide, sent to [facility] D/C potassium supplement  Please call and make pt aware

## 2014-11-04 ENCOUNTER — Other Ambulatory Visit: Payer: Self-pay | Admitting: Internal Medicine

## 2014-11-11 ENCOUNTER — Other Ambulatory Visit: Payer: Medicare Other

## 2014-11-14 NOTE — Telephone Encounter (Signed)
Left detailed msg on VM per HIPAA  

## 2014-11-25 ENCOUNTER — Telehealth: Payer: Self-pay | Admitting: Internal Medicine

## 2014-11-25 ENCOUNTER — Other Ambulatory Visit: Payer: Self-pay | Admitting: Internal Medicine

## 2014-11-25 NOTE — Telephone Encounter (Signed)
Pt has appt 11/26/14 at 2:15 with Nicki Reaperegina Baity NP.

## 2014-11-25 NOTE — Telephone Encounter (Signed)
Patient Name: Denise BurrowBARBARA Labine DOB: December 26, 1940 Initial Comment Caller states mother is urinatig Nurse Assessment Nurse: Yetta BarreJones, RN, Miranda Date/Time (Eastern Time): 11/25/2014 11:18:55 AM Confirm and document reason for call. If symptomatic, describe symptoms. ---Caller states her mother has been having diarrhea and frequent urination for 2 weeks. The last 3 days the know she has had blood in the stool. Has the patient traveled out of the country within the last 30 days? ---No Does the patient have any new or worsening symptoms? ---Yes Will a triage be completed? ---Yes Related visit to physician within the last 2 weeks? ---No Does the PT have any chronic conditions? (i.e. diabetes, asthma, etc.) ---Yes List chronic conditions. ---Dementia Guidelines Guideline Title Affirmed Question Affirmed Notes Diarrhea [1] MODERATE diarrhea (e.g., 4-6 times / day more than normal) AND [2] present > 48 hours (2 days) Final Disposition User See Physician within 24 Hours Yetta BarreJones, RN, Miranda Comments Appt scheduled for tomorrow 11/8 at 2:15 with Denise [name]. Referrals REFERRED TO PCP OFFICE Disagree/Comply: Comply

## 2014-11-26 ENCOUNTER — Ambulatory Visit (INDEPENDENT_AMBULATORY_CARE_PROVIDER_SITE_OTHER): Payer: Medicare Other | Admitting: Internal Medicine

## 2014-11-26 ENCOUNTER — Encounter: Payer: Self-pay | Admitting: Internal Medicine

## 2014-11-26 ENCOUNTER — Other Ambulatory Visit: Payer: Self-pay | Admitting: Internal Medicine

## 2014-11-26 VITALS — BP 120/60 | HR 78 | Temp 98.2°F | Wt 197.0 lb

## 2014-11-26 DIAGNOSIS — N3281 Overactive bladder: Secondary | ICD-10-CM

## 2014-11-26 DIAGNOSIS — R195 Other fecal abnormalities: Secondary | ICD-10-CM

## 2014-11-26 DIAGNOSIS — K921 Melena: Secondary | ICD-10-CM

## 2014-11-26 DIAGNOSIS — R350 Frequency of micturition: Secondary | ICD-10-CM

## 2014-11-26 DIAGNOSIS — R7989 Other specified abnormal findings of blood chemistry: Secondary | ICD-10-CM

## 2014-11-26 LAB — POCT URINALYSIS DIPSTICK
Bilirubin, UA: NEGATIVE
Glucose, UA: 250
KETONES UA: NEGATIVE
Nitrite, UA: NEGATIVE
UROBILINOGEN UA: NEGATIVE
pH, UA: 6

## 2014-11-26 LAB — CBC
HEMATOCRIT: 44.2 % (ref 36.0–46.0)
Hemoglobin: 14.6 g/dL (ref 12.0–15.0)
MCHC: 33.1 g/dL (ref 30.0–36.0)
MCV: 92.7 fl (ref 78.0–100.0)
Platelets: 270 10*3/uL (ref 150.0–400.0)
RBC: 4.77 Mil/uL (ref 3.87–5.11)
RDW: 13.2 % (ref 11.5–15.5)
WBC: 14 10*3/uL — ABNORMAL HIGH (ref 4.0–10.5)

## 2014-11-26 MED ORDER — OXYBUTYNIN CHLORIDE ER 15 MG PO TB24: 15.0000 mg | ORAL_TABLET | Freq: Every day | ORAL | Status: DC

## 2014-11-26 NOTE — Patient Instructions (Signed)
Overactive Bladder, Adult Overactive bladder is a group of urinary symptoms. With overactive bladder, you may suddenly feel the need to pass urine (urinate) right away. After feeling this sudden urge, you might also leak urine if you cannot get to the bathroom fast enough (urinary incontinence). These symptoms might interfere with your daily work or social activities. Overactive bladder symptoms may also wake you up at night. Overactive bladder affects the nerve signals between your bladder and your brain. Your bladder may get the signal to empty before it is full. Very sensitive muscles can also make your bladder squeeze too soon. CAUSES Many things can cause an overactive bladder. Possible causes include:  Urinary tract infection.  Infection of nearby tissues, such as the prostate.  Prostate enlargement.  Being pregnant with twins or more (multiples).  Surgery on the uterus or urethra.  Bladder stones, inflammation, or tumors.  Drinking too much caffeine or alcohol.  Certain medicines, especially those that you take to help your body get rid of extra fluid (diuretics) by increasing urine production.  Muscle or nerve weakness, especially from:  A spinal cord injury.  Stroke.  Multiple sclerosis.  Parkinson disease.  Diabetes. This can cause a high urine volume that fills the bladder so quickly that the normal urge to urinate is triggered very strongly.  Constipation. A buildup of too much stool can put pressure on your bladder. RISK FACTORS You may be at greater risk for overactive bladder if you:  Are an older adult.  Smoke.  Are going through menopause.  Have prostate problems.  Have a neurological disease, such as stroke, dementia, Parkinson disease, or multiple sclerosis (MS).  Eat or drink things that irritate the bladder. These include alcohol, spicy food, and caffeine.  Are overweight or obese. SIGNS AND SYMPTOMS  The signs and symptoms of an overactive  bladder include:  Sudden, strong urges to urinate.  Leaking urine.  Urinating eight or more times per day.  Waking up to urinate two or more times per night. DIAGNOSIS Your health care provider may suspect overactive bladder based on your symptoms. The health care provider will do a physical exam and take your medical history. Blood or urine tests may also be done. For example, you might need to have a bladder function test to check how well you can hold your urine. You might also need to see a health care provider who specializes in the urinary tract (urologist). TREATMENT Treatment for overactive bladder depends on the cause of your condition and whether it is mild or severe. Certain treatments can be done in your health care provider's office or clinic. You can also make lifestyle changes at home. Options include: Behavioral Treatments  Biofeedback. A specialist uses sensors to help you become aware of your body's signals.  Keeping a daily log of when you need to urinate and what happens after the urge. This may help you manage your condition.  Bladder training. This helps you learn to control the urge to urinate by following a schedule that directs you to urinate at regular intervals (timed voiding). At first, you might have to wait a few minutes after feeling the urge. In time, you should be able to schedule bathroom visits an hour or more apart.  Kegel exercises. These are exercises to strengthen the pelvic floor muscles, which support the bladder. Toning these muscles can help you control urination, even if your bladder muscles are overactive. A specialist will teach you how to do these exercises correctly. They   require daily practice.  Weight loss. If you are obese or overweight, losing weight might relieve your symptoms of overactive bladder. Talk to your health care provider about losing weight and whether there is a specific program or method that would work best for you.  Diet  change. This might help if constipation is making your overactive bladder worse. Your health care provider or a dietitian can explain ways to change what you eat to ease constipation. You might also need to consume less alcohol and caffeine or drink other fluids at different times of the day.  Stopping smoking.  Wearing pads to absorb leakage while you wait for other treatments to take effect. Physical Treatments  Electrical stimulation. Electrodes send gentle pulses of electricity to strengthen the nerves or muscles that help to control the bladder. Sometimes, the electrodes are placed outside of the body. In other cases, they might be placed inside the body (implanted). This treatment can take several months to have an effect.  Supportive devices. Women may need a plastic device that fits into the vagina and supports the bladder (pessary). Medicines Several medicines can help treat overactive bladder and are usually used along with other treatments. Some are injected into the muscles involved in urination. Others come in pill form. Your health care provider may prescribe:  Antispasmodics. These medicines block the signals that the nerves send to the bladder. This keeps the bladder from releasing urine at the wrong time.  Tricyclic antidepressants. These types of antidepressants also relax bladder muscles. Surgery  You may have a device implanted to help manage the nerve signals that indicate when you need to urinate.  You may have surgery to implant electrodes for electrical stimulation.  Sometimes, very severe cases of overactive bladder require surgery to change the shape of the bladder. HOME CARE INSTRUCTIONS   Take medicines only as directed by your health care provider.  Use any implants or a pessary as directed by your health care provider.  Make any diet or lifestyle changes that are recommended by your health care provider. These might include:  Drinking less fluid or  drinking at different times of the day. If you need to urinate often during the night, you may need to stop drinking fluids early in the evening.  Cutting down on caffeine or alcohol. Both can make an overactive bladder worse. Caffeine is found in coffee, tea, and sodas.  Doing Kegel exercises to strengthen muscles.  Losing weight if you need to.  Eating a healthy and balanced diet to prevent constipation.  Keep a journal or log to track how much and when you drink and also when you feel the need to urinate. This will help your health care provider to monitor your condition. SEEK MEDICAL CARE IF:  Your symptoms do not get better after treatment.  Your pain and discomfort are getting worse.  You have more frequent urges to urinate.  You have a fever. SEEK IMMEDIATE MEDICAL CARE IF: You are not able to control your bladder at all.   This information is not intended to replace advice given to you by your health care provider. Make sure you discuss any questions you have with your health care provider.   Document Released: 10/31/2008 Document Revised: 01/25/2014 Document Reviewed: 05/30/2013 [employer] Interactive Patient Education 2016 [employer]  

## 2014-11-26 NOTE — Progress Notes (Signed)
Subjective:    Patient ID: Denise [name], female    DOB: 1940-03-14, 74 y.o.   MRN: [id number]  HPI Denise [name] is a 74 year old female who presents today with chief complaint of diarrhea for more than 6 months. In the last 10 days she noted blood. She describes the blood as dark.  No abdominal pain or fevers. She has not been on any antibiotics in the last 6 weeks.  She also complains of urinary frequency for a few months. She takes ditropan twice a day . She says she has to urinate as frequently as every 5-10 min.  She does have a history of DM. Last A1C was 8.4.  Her fasting am blood sugars have been in the 190's. Her family would like to see about starting her on a new bladder control medication.      Review of Systems  Constitutional: Negative for fever, chills and fatigue.  HENT: Negative.   Respiratory: Negative for cough, shortness of breath and wheezing.   Cardiovascular: Negative for chest pain, palpitations and leg swelling.  Gastrointestinal: Positive for diarrhea and blood in stool. Negative for abdominal pain and constipation.  Genitourinary: Positive for urgency and frequency. Negative for dysuria and flank pain.  Neurological: Negative for dizziness, light-headedness and headaches.   Family History  Problem Relation Age of Onset  . Heart disease Mother   . Cancer Mother     ovarian  . Diabetes Neg Hx   . Stroke Neg Hx    Current Outpatient Prescriptions on File Prior to Visit  Medication Sig Dispense Refill  . albuterol (PROVENTIL HFA;VENTOLIN HFA) 108 (90 BASE) MCG/ACT inhaler Inhale 2 puffs into the lungs every 6 (six) hours as needed. 3 Inhaler 0  . alendronate (FOSAMAX) 70 MG tablet Take 1 tablet (70 mg total) by mouth once a week. Take with a full glass of water on an empty stomach. 12 tablet 0  . aspirin 325 MG tablet Take 325 mg by mouth daily.      Marland Kitchen atorvastatin (LIPITOR) 20 MG tablet Take 1 tablet (20 mg total) by mouth daily. 90 tablet 2  . BD PEN NEEDLE  NANO U/F 32G X 4 MM MISC USE DAILY AS DIRECTED 100 each 6  . Blood Glucose Monitoring Suppl (ACCU-CHEK NANO SMARTVIEW) W/DEVICE KIT by Does not apply route.    . Calcium Carbonate-Vitamin D (CALTRATE 600+D PO) Take by mouth 2 (two) times daily.      . Cholecalciferol (VITAMIN D) 2000 UNITS CAPS Take 1 capsule by mouth daily.    Marland Kitchen donepezil (ARICEPT) 23 MG TABS tablet Take 1 tablet (23 mg total) by mouth at bedtime. 90 tablet 3  . fish oil-omega-3 fatty acids 1000 MG capsule Take 2 g by mouth daily.      Marland Kitchen glipiZIDE (GLUCOTROL) 5 MG tablet Take 1 tablet (5 mg total) by mouth 2 (two) times daily before a meal. 180 tablet 1  . glucose blood (ACCU-CHEK SMARTVIEW) test strip 1 each by Other route 2 (two) times daily. 100 each 6  . Insulin Glargine (LANTUS SOLOSTAR) 100 UNIT/ML Solostar Pen Inject 40 Units into the skin daily at 10 pm. 45 mL 3  . linagliptin (TRADJENTA) 5 MG TABS tablet Take 1 tablet (5 mg total) by mouth daily. 90 tablet 2  . memantine (NAMENDA) 5 MG tablet Take 1 tablet (5 mg total) by mouth daily. 90 tablet 3  . metFORMIN (GLUCOPHAGE) 500 MG tablet Take 1 tablet (500 mg total)  by mouth 2 (two) times daily with a meal. 180 tablet 2  . multivitamin (THERAGRAN) per tablet Take 1 tablet by mouth daily.      Marland Kitchen oxybutynin (DITROPAN) 5 MG tablet Take 1 tablet (5 mg total) by mouth 2 (two) times daily. 180 tablet 2  . ramipril (ALTACE) 10 MG capsule Take 1 capsule (10 mg total) by mouth daily. 90 capsule 3  . ranitidine (ZANTAC) 150 MG tablet Take 1 tablet (150 mg total) by mouth 2 (two) times daily. 180 tablet 3  . traMADol (ULTRAM) 50 MG tablet Take 1 tablet (50 mg total) by mouth every 8 (eight) hours as needed for moderate pain. 90 tablet 0   No current facility-administered medications on file prior to visit.       Objective:   Physical Exam  Constitutional: She appears well-developed and well-nourished.  HENT:  Head: Normocephalic.  Neck: Normal range of motion. Neck supple.    Cardiovascular: Normal rate, regular rhythm and normal heart sounds.   Pulmonary/Chest: Effort normal and breath sounds normal.  Abdominal: Soft. Bowel sounds are normal. There is tenderness (right lower quadrant. ).  Musculoskeletal: Normal range of motion.  Neurological: She is alert.  Skin: Skin is warm and dry.  Psychiatric: She has a normal mood and affect.            Assessment & Plan:  1. Urinary incontinence Will increase Ditropan to 30m BID UA and culture today 2. Diarrhea Will send stool culture.  Rectal exam preformed - no external or internal hemorrhoids  Noted. No stool returned.  Will send CBC to check for anemia.

## 2014-11-26 NOTE — Progress Notes (Signed)
Subjective:    Patient ID: Denise [name], female    DOB: 1940/02/23, 74 y.o.   MRN: [id number]  HPI  Pt presents to the clinic today with c/o urinary urgency. This has been going on for months. It has not gotten worse but she just feels like it is not well controlled. She does have OAB and is on Ditropan. She denies dysuria or vaginal complaints. She does have diabetes, her fasting blood sugar are in the 190's. Her last A1C was 8.4% 10/2014.  She has also noted loose stool and blood in her stool. This started about 6 months ago but worsened in the last 10 days ago. The daughter reports the blood is dark burgundy, not bright red. She denies nausea, vomiting, constipation or diarrhea. She denies reflux or abdominal pain. She is on Metformin for diabetes. She has not been on any antibiotics recently. She denies medication changes. She has had hemorrhoids in the past. She denies family history of colon cancer.  Review of Systems      Past Medical History  Diagnosis Date  . Asthma   . COPD (chronic obstructive pulmonary disease) ([place])   . GERD (gastroesophageal reflux disease)   . Arthritis   . Diabetes mellitus   . Hyperlipidemia   . Hypertension   . Hx of atrial fibrillation, no current medication   . Osteopenia   . Asthma   . Cataracts, bilateral 12/2010  . Plantar fasciitis 2010  . Carpal tunnel syndrome 09/2006    bilateral     Current Outpatient Prescriptions  Medication Sig Dispense Refill  . albuterol (PROVENTIL HFA;VENTOLIN HFA) 108 (90 BASE) MCG/ACT inhaler Inhale 2 puffs into the lungs every 6 (six) hours as needed. 3 Inhaler 0  . alendronate (FOSAMAX) 70 MG tablet Take 1 tablet (70 mg total) by mouth once a week. Take with a full glass of water on an empty stomach. 12 tablet 0  . aspirin 325 MG tablet Take 325 mg by mouth daily.      Marland Kitchen atorvastatin (LIPITOR) 20 MG tablet Take 1 tablet (20 mg total) by mouth daily. 90 tablet 2  . BD PEN NEEDLE NANO U/F 32G X 4 MM MISC  USE DAILY AS DIRECTED 100 each 6  . Blood Glucose Monitoring Suppl (ACCU-CHEK NANO SMARTVIEW) W/DEVICE KIT by Does not apply route.    . Calcium Carbonate-Vitamin D (CALTRATE 600+D PO) Take by mouth 2 (two) times daily.      . Cholecalciferol (VITAMIN D) 2000 UNITS CAPS Take 1 capsule by mouth daily.    Marland Kitchen donepezil (ARICEPT) 23 MG TABS tablet Take 1 tablet (23 mg total) by mouth at bedtime. 90 tablet 3  . fish oil-omega-3 fatty acids 1000 MG capsule Take 2 g by mouth daily.      Marland Kitchen glipiZIDE (GLUCOTROL) 5 MG tablet Take 1 tablet (5 mg total) by mouth 2 (two) times daily before a meal. 180 tablet 1  . glucose blood (ACCU-CHEK SMARTVIEW) test strip 1 each by Other route 2 (two) times daily. 100 each 6  . Insulin Glargine (LANTUS SOLOSTAR) 100 UNIT/ML Solostar Pen Inject 40 Units into the skin daily at 10 pm. 45 mL 3  . linagliptin (TRADJENTA) 5 MG TABS tablet Take 1 tablet (5 mg total) by mouth daily. 90 tablet 2  . memantine (NAMENDA) 5 MG tablet Take 1 tablet (5 mg total) by mouth daily. 90 tablet 3  . metFORMIN (GLUCOPHAGE) 500 MG tablet Take 1 tablet (500 mg total) by  mouth 2 (two) times daily with a meal. 180 tablet 2  . multivitamin (THERAGRAN) per tablet Take 1 tablet by mouth daily.      Marland Kitchen oxybutynin (DITROPAN) 5 MG tablet Take 1 tablet (5 mg total) by mouth 2 (two) times daily. 180 tablet 2  . polyethylene glycol powder (GLYCOLAX/MIRALAX) powder Take 17 g by mouth daily as needed. 500 g 11  . ramipril (ALTACE) 10 MG capsule Take 1 capsule (10 mg total) by mouth daily. 90 capsule 3  . ranitidine (ZANTAC) 150 MG tablet Take 1 tablet (150 mg total) by mouth 2 (two) times daily. 180 tablet 3  . traMADol (ULTRAM) 50 MG tablet Take 1 tablet (50 mg total) by mouth every 8 (eight) hours as needed for moderate pain. 90 tablet 0   No current facility-administered medications for this visit.    Allergies  Allergen Reactions  . Codeine Other (See Comments)    unknown  . Sulfonamide Derivatives  Other (See Comments)    unknown    Family History  Problem Relation Age of Onset  . Heart disease Mother   . Cancer Mother     ovarian  . Diabetes Neg Hx   . Stroke Neg Hx     Social History   Social History  . Marital Status: Married    Spouse Name: N/A  . Number of Children: N/A  . Years of Education: N/A   Occupational History  . Not on file.   Social History Main Topics  . Smoking status: Never Smoker   . Smokeless tobacco: Not on file  . Alcohol Use: No  . Drug Use: No  . Sexual Activity:    Partners: Male    Birth Control/ Protection: Post-menopausal   Other Topics Concern  . Not on file   Social History Narrative     Constitutional: Denies fever, malaise, fatigue, headache or abrupt weight changes.  Respiratory: Denies difficulty breathing, shortness of breath, cough or sputum production.   Cardiovascular: Denies chest pain, chest tightness, palpitations or swelling in the hands or feet.  Gastrointestinal: Pt reports blood in her stool. Denies abdominal pain, bloating, constipation, diarrhea.  GU: Pt reports urinary urgency. Denies frequency, pain with urination, burning sensation, blood in urine, odor or discharge.  No other specific complaints in a complete review of systems (except as listed in HPI above).  Objective:   Physical Exam     Wt Readings from Last 3 Encounters:  10/24/14 194 lb (87.998 kg)  03/21/14 197 lb 8.5 oz (89.6 kg)  03/07/14 209 lb 7 oz (95 kg)    General: Appears her stated age, chronically ill appearing in NAD. Cardiovascular: Normal rate and rhythm. S1,S2 noted.  No murmur, rubs or gallops noted.  Pulmonary/Chest: Normal effort and positive vesicular breath sounds. No respiratory distress. No wheezes, rales or ronchi noted.  Abdomen: Soft and generally tender. Normal bowel sounds. No distention or masses noted.  Rectal: No external hemorrhoid noted. Normal rectal tone. No mass or fissure noted. No blood noted on exam.  Mucous stool noted. Neurological: Alert and confused at times.  BMET    Component Value Date/Time   NA 138 10/24/2014 1536   K 4.9 10/24/2014 1536   CL 101 10/24/2014 1536   CO2 30 10/24/2014 1536   GLUCOSE 172* 10/24/2014 1536   BUN 14 10/24/2014 1536   CREATININE 0.60 10/24/2014 1536   CALCIUM 10.0 10/24/2014 1536   GFRNONAA >90 03/25/2014 0633   GFRAA >90 03/25/2014 7939  Lipid Panel     Component Value Date/Time   CHOL 218* 10/24/2014 1536   TRIG 208.0* 10/24/2014 1536   HDL 66.20 10/24/2014 1536   CHOLHDL 3 10/24/2014 1536   VLDL 41.6* 10/24/2014 1536   LDLCALC 56 12/24/2013 1054    CBC    Component Value Date/Time   WBC 12.6* 10/24/2014 1536   RBC 4.76 10/24/2014 1536   HGB 14.9 10/24/2014 1536   HCT 45.1 10/24/2014 1536   PLT 263.0 10/24/2014 1536   MCV 94.7 10/24/2014 1536   MCH 31.6 03/25/2014 0633   MCHC 33.1 10/24/2014 1536   RDW 13.4 10/24/2014 1536   LYMPHSABS 2.1 03/14/2014 0600   MONOABS 1.2* 03/14/2014 0600   EOSABS 0.5 03/14/2014 0600   BASOSABS 0.0 03/14/2014 0600    Hgb A1C Lab Results  Component Value Date   HGBA1C 8.4* 10/24/2014         Assessment & Plan:   Urinary urgency:  Urinalysis: Not enough urine to send culture Likely r/t OAB Will increase Ditropan to 10 mg, RX sent to pharmacy  Loose stools and blood in stool:  Rectal exam normal No indication that this is Cdiff ? R/T Metformin and frequent stooling Will obtain CBC and stool culture If persist, consider stopping Metformin and increasing Glipizide Consider referral to GI if symptoms persist  Will follow up after labs, RTC as needed or if symptoms persist or worsen

## 2014-11-26 NOTE — Addendum Note (Signed)
Addended by: Lorre MunroeBAITY, REGINA W on: 11/26/2014 04:17 PM   Modules accepted: Kipp BroodSmartSet

## 2014-11-26 NOTE — Addendum Note (Signed)
Addended by: Roena MaladyEVONTENNO, Lin Glazier Y on: 11/26/2014 04:38 PM   Modules accepted: Orders

## 2014-11-26 NOTE — Progress Notes (Signed)
Pre visit review using our clinic review tool, if applicable. No additional management support is needed unless otherwise documented below in the visit note. 

## 2014-12-02 ENCOUNTER — Other Ambulatory Visit: Payer: Self-pay | Admitting: Internal Medicine

## 2014-12-02 NOTE — Addendum Note (Signed)
Addended by: Alvina ChouWALSH, Jarick Harkins J on: 12/02/2014 08:25 AM   Modules accepted: Orders

## 2014-12-05 ENCOUNTER — Telehealth: Payer: Self-pay | Admitting: *Deleted

## 2014-12-05 ENCOUNTER — Other Ambulatory Visit: Payer: Self-pay | Admitting: Internal Medicine

## 2014-12-05 NOTE — Telephone Encounter (Signed)
noted 

## 2014-12-05 NOTE — Telephone Encounter (Signed)
Christy called [facility] to let the pts PCP know she did not show up for her diabetic eye exam on 10/27-states she has called the pt a few times and has not been able to reach her.

## 2014-12-23 ENCOUNTER — Other Ambulatory Visit: Payer: Self-pay | Admitting: Internal Medicine

## 2014-12-30 ENCOUNTER — Other Ambulatory Visit: Payer: Self-pay | Admitting: Internal Medicine

## 2015-01-06 ENCOUNTER — Ambulatory Visit: Payer: Medicare Other | Admitting: Internal Medicine

## 2015-01-14 ENCOUNTER — Ambulatory Visit: Payer: Medicare Other | Admitting: Internal Medicine

## 2015-01-21 ENCOUNTER — Other Ambulatory Visit (INDEPENDENT_AMBULATORY_CARE_PROVIDER_SITE_OTHER): Payer: Medicare Other

## 2015-01-21 ENCOUNTER — Ambulatory Visit (INDEPENDENT_AMBULATORY_CARE_PROVIDER_SITE_OTHER): Payer: Medicare Other | Admitting: Internal Medicine

## 2015-01-21 DIAGNOSIS — D72829 Elevated white blood cell count, unspecified: Secondary | ICD-10-CM

## 2015-01-22 LAB — CBC WITH DIFFERENTIAL/PLATELET
Basophils Absolute: 0 10*3/uL (ref 0.0–0.1)
Basophils Relative: 0.2 % (ref 0.0–3.0)
EOS PCT: 1.8 % (ref 0.0–5.0)
Eosinophils Absolute: 0.2 10*3/uL (ref 0.0–0.7)
HCT: 43.9 % (ref 36.0–46.0)
HEMOGLOBIN: 14.5 g/dL (ref 12.0–15.0)
LYMPHS PCT: 22.8 % (ref 12.0–46.0)
Lymphs Abs: 3.1 10*3/uL (ref 0.7–4.0)
MCHC: 33.1 g/dL (ref 30.0–36.0)
MCV: 93.2 fl (ref 78.0–100.0)
MONO ABS: 0.8 10*3/uL (ref 0.1–1.0)
MONOS PCT: 5.7 % (ref 3.0–12.0)
Neutro Abs: 9.4 10*3/uL — ABNORMAL HIGH (ref 1.4–7.7)
Neutrophils Relative %: 69.5 % (ref 43.0–77.0)
Platelets: 249 10*3/uL (ref 150.0–400.0)
RBC: 4.71 Mil/uL (ref 3.87–5.11)
RDW: 13.4 % (ref 11.5–15.5)
WBC: 13.5 10*3/uL — AB (ref 4.0–10.5)

## 2015-01-22 LAB — PATHOLOGIST SMEAR REVIEW

## 2015-01-23 ENCOUNTER — Other Ambulatory Visit: Payer: Self-pay

## 2015-01-23 NOTE — Telephone Encounter (Signed)
Denise DiceAngela [name] (DPR signed) left note requesting refill on tramadol; ibuprofen is not helping knee and hip pain. Unable to reach [place] by phone. Tramadol on med list filled by Dr Denise [name] on 04/10/14 for # 90 x 0. Do not see where Denise Hurry Baity NP has filled tramadol.pt has appt on 01/28/15 for competency letter.Please advise.Denise [name].

## 2015-01-23 NOTE — Telephone Encounter (Signed)
Will discuss this at her upcoming appt

## 2015-01-24 NOTE — Telephone Encounter (Signed)
Spoke to angela and she is aware---also appt changed to 30 mins per provider request

## 2015-01-28 ENCOUNTER — Ambulatory Visit: Payer: Medicare Other | Admitting: Internal Medicine

## 2015-01-28 ENCOUNTER — Other Ambulatory Visit: Payer: Self-pay | Admitting: Internal Medicine

## 2015-02-06 ENCOUNTER — Ambulatory Visit (INDEPENDENT_AMBULATORY_CARE_PROVIDER_SITE_OTHER): Payer: Medicare Other | Admitting: Internal Medicine

## 2015-02-06 ENCOUNTER — Encounter: Payer: Self-pay | Admitting: Internal Medicine

## 2015-02-06 VITALS — BP 138/76 | HR 76 | Temp 98.2°F | Wt 190.0 lb

## 2015-02-06 DIAGNOSIS — F0390 Unspecified dementia without behavioral disturbance: Secondary | ICD-10-CM

## 2015-02-06 DIAGNOSIS — M25559 Pain in unspecified hip: Secondary | ICD-10-CM

## 2015-02-06 MED ORDER — MEMANTINE HCL 5 MG PO TABS: 5.0000 mg | ORAL_TABLET | Freq: Two times a day (BID) | ORAL | Status: DC

## 2015-02-06 MED ORDER — OXYBUTYNIN CHLORIDE ER 15 MG PO TB24: 15.0000 mg | ORAL_TABLET | Freq: Every day | ORAL | Status: DC

## 2015-02-06 NOTE — Progress Notes (Signed)
Pre visit review using our clinic review tool, if applicable. No additional management support is needed unless otherwise documented below in the visit note. 

## 2015-02-06 NOTE — Progress Notes (Signed)
Subjective:    Patient ID: Denise [name], female    DOB: 1940/08/16, 75 y.o.   MRN: [id number]  HPI  Pt is brought to the clinic today by her daughter in law, for a letter of competency. She is trying to change her will, but the lawyer is requesting a competency evaluation, given her history of dementia. Per the daughter in law, other family members have been asking for the pt's husband's will after his death and also after the death of her son. She has as her durable power of attorney, her cousin Carloyn Manner in [place], who apparently wants to put her in a skilled nursing facility, and gain access to all of her financial information. Denise [name], Denise [name]'s daughter in law tells me that Denise [name] would like to cancel Carloyn Manner as the power of attorney and transfer it to her daughter (Denise. Engineer, drilling).   I interviewed Denise [name] alone. She tells me she lives at her home. Her daughter in law Denise [name]), grandaughter, and Angela's mother all live there. She was not sure why she was here today. She does tell me that her next of kin is Carloyn Manner, in [place]. She did not realize that he was the power of attorney and that he was trying to gain access to her financial information. She tells me that she does not want Carloyn Manner to be her power of attorney. She tells me that when she dies, she wants everything left to Denise [name]. She tells me that she feels safe and trust Denise [name] and that she does not trust [place].  The daughter in law also reports that resident is repeating things often. Her long term memory seems to be intact, but short term memory is not. Her daughter in law feels like her [place] needs to be increased. She is on Aricept as well.  The daughter in law reports Denise [name] also c/o hip pain. She broke her hip 03/2014. Denise [name] reports she is not having any pain in her hip. She does not think she needs anything or than Tylenol. She does walk with assistance from a cane.   Review of Systems  Past Medical History    Diagnosis Date  . Asthma   . COPD (chronic obstructive pulmonary disease) ([place])   . GERD (gastroesophageal reflux disease)   . Arthritis   . Diabetes mellitus   . Hyperlipidemia   . Hypertension   . Hx of atrial fibrillation, no current medication   . Osteopenia   . Asthma   . Cataracts, bilateral 12/2010  . Plantar fasciitis 2010  . Carpal tunnel syndrome 09/2006    bilateral     Current Outpatient Prescriptions  Medication Sig Dispense Refill  . albuterol (PROVENTIL HFA;VENTOLIN HFA) 108 (90 BASE) MCG/ACT inhaler Inhale 2 puffs into the lungs every 6 (six) hours as needed. 3 Inhaler 0  . alendronate (FOSAMAX) 70 MG tablet Take 1 tablet (70 mg total) by mouth once a week. Take with a full glass of water on an empty stomach. 12 tablet 0  . aspirin 325 MG tablet Take 325 mg by mouth daily.      Marland Kitchen atorvastatin (LIPITOR) 20 MG tablet Take 1 tablet (20 mg total) by mouth daily. 90 tablet 2  . BD PEN NEEDLE NANO U/F 32G X 4 MM MISC USE DAILY AS DIRECTED 100 each 6  . Blood Glucose Monitoring Suppl (ACCU-CHEK NANO SMARTVIEW) W/DEVICE KIT by Does not apply route.    . Calcium Carbonate-Vitamin D (CALTRATE  600+D PO) Take by mouth 2 (two) times daily.      . Cholecalciferol (VITAMIN D) 2000 UNITS CAPS Take 1 capsule by mouth daily.    Marland Kitchen donepezil (ARICEPT) 23 MG TABS tablet TAKE 1 TABLET BY MOUTH ATBEDTIME. 90 tablet 1  . fish oil-omega-3 fatty acids 1000 MG capsule Take 2 g by mouth daily.      Marland Kitchen glipiZIDE (GLUCOTROL) 5 MG tablet Take 1 tablet (5 mg total) by mouth 2 (two) times daily before a meal. 180 tablet 1  . glucose blood (ACCU-CHEK SMARTVIEW) test strip 1 each by Other route 2 (two) times daily. 100 each 6  . Insulin Glargine (LANTUS SOLOSTAR) 100 UNIT/ML Solostar Pen Inject 40 Units into the skin daily at 10 pm. 45 mL 3  . linagliptin (TRADJENTA) 5 MG TABS tablet Take 1 tablet (5 mg total) by mouth daily. 90 tablet 2  . memantine (NAMENDA) 5 MG tablet TAKE 1 TABLET BY MOUTH DAILY.  90 tablet 2  . metFORMIN (GLUCOPHAGE) 500 MG tablet Take 1 tablet (500 mg total) by mouth 2 (two) times daily with a meal. 180 tablet 2  . multivitamin (THERAGRAN) per tablet Take 1 tablet by mouth daily.      Marland Kitchen oxybutynin (DITROPAN XL) 15 MG 24 hr tablet Take 1 tablet (15 mg total) by mouth at bedtime. 30 tablet 2  . ramipril (ALTACE) 10 MG capsule Take 1 capsule (10 mg total) by mouth daily. 90 capsule 3  . ranitidine (ZANTAC) 150 MG tablet Take 1 tablet (150 mg total) by mouth 2 (two) times daily. 180 tablet 3  . traMADol (ULTRAM) 50 MG tablet Take 1 tablet (50 mg total) by mouth every 8 (eight) hours as needed for moderate pain. 90 tablet 0   No current facility-administered medications for this visit.    Allergies  Allergen Reactions  . Codeine Other (See Comments)    unknown  . Sulfonamide Derivatives Other (See Comments)    unknown    Family History  Problem Relation Age of Onset  . Heart disease Mother   . Cancer Mother     ovarian  . Diabetes Neg Hx   . Stroke Neg Hx     Social History   Social History  . Marital Status: Married    Spouse Name: N/A  . Number of Children: N/A  . Years of Education: N/A   Occupational History  . Not on file.   Social History Main Topics  . Smoking status: Never Smoker   . Smokeless tobacco: Not on file  . Alcohol Use: No  . Drug Use: No  . Sexual Activity:    Partners: Male    Birth Control/ Protection: Post-menopausal   Other Topics Concern  . Not on file   Social History Narrative     Constitutional: Denies fever, malaise, fatigue, headache or abrupt weight changes.  Musculoskeletal: Denies decrease in range of motion, difficulty with gait, muscle pain or joint pain and swelling.  Neurological: Pt reports difficulty with memory. Denies dizziness, difficulty with speech or problems with balance and coordination.  Psych: Denies anxiety, depression, SI/HI.  No other specific complaints in a complete review of systems  (except as listed in HPI above).     Objective:   Physical Exam   BP 138/76 mmHg  Pulse 76  Temp(Src) 98.2 F (36.8 C) (Oral)  Wt 190 lb (86.183 kg)  SpO2 97%  LMP 01/19/1996 Wt Readings from Last 3 Encounters:  02/06/15 190 lb (86.183  kg)  11/26/14 197 lb (89.359 kg)  10/24/14 194 lb (87.998 kg)    General: Appears her stated age, obese in NAD. Musculoskeletal: Using cane for assistance with gait. Neurological: Awake, engages and makes eye contact. Decreased short term memory. Oriented to person and place. See attache Mini Mental Status exam. Psychiatric: Mood and affect norma mildly flat.  BMET    Component Value Date/Time   NA 138 10/24/2014 1536   K 4.9 10/24/2014 1536   CL 101 10/24/2014 1536   CO2 30 10/24/2014 1536   GLUCOSE 172* 10/24/2014 1536   BUN 14 10/24/2014 1536   CREATININE 0.60 10/24/2014 1536   CALCIUM 10.0 10/24/2014 1536   GFRNONAA >90 03/25/2014 0633   GFRAA >90 03/25/2014 0633    Lipid Panel     Component Value Date/Time   CHOL 218* 10/24/2014 1536   TRIG 208.0* 10/24/2014 1536   HDL 66.20 10/24/2014 1536   CHOLHDL 3 10/24/2014 1536   VLDL 41.6* 10/24/2014 1536   LDLCALC 56 12/24/2013 1054    CBC    Component Value Date/Time   WBC 13.5* 01/21/2015 1550   RBC 4.71 01/21/2015 1550   HGB 14.5 01/21/2015 1550   HCT 43.9 01/21/2015 1550   PLT 249.0 01/21/2015 1550   MCV 93.2 01/21/2015 1550   MCH 31.6 03/25/2014 0633   MCHC 33.1 01/21/2015 1550   RDW 13.4 01/21/2015 1550   LYMPHSABS 3.1 01/21/2015 1550   MONOABS 0.8 01/21/2015 1550   EOSABS 0.2 01/21/2015 1550   BASOSABS 0.0 01/21/2015 1550    Hgb A1C Lab Results  Component Value Date   HGBA1C 8.4* 10/24/2014        Assessment & Plan:   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 Moderate white matter changes can be seen with chronic small vessel ischemic disease.  Mini Mental Status score: 20/30- mild to moderate  cognitive impairment Continue Aricept Will increase Namenda to 5 mg BID, new RX sent to pharmacy Will forward copy of this note, copy of MMS to lawyer as requested  Hip pain:  Continue Tylenol prn  RTC in 3 months to follow up chronic conditions

## 2015-02-06 NOTE — Patient Instructions (Signed)

## 2015-02-09 ENCOUNTER — Encounter (HOSPITAL_COMMUNITY): Payer: Self-pay | Admitting: Nurse Practitioner

## 2015-02-09 ENCOUNTER — Emergency Department (HOSPITAL_COMMUNITY): Payer: Medicare Other

## 2015-02-09 ENCOUNTER — Emergency Department (HOSPITAL_COMMUNITY)
Admission: EM | Admit: 2015-02-09 | Discharge: 2015-02-09 | Disposition: A | Discharge status: Home / Self Care | Payer: Medicare Other | Attending: Physician Assistant | Admitting: Physician Assistant

## 2015-02-09 DIAGNOSIS — W07XXXA Fall from chair, initial encounter: Secondary | ICD-10-CM | POA: Diagnosis not present

## 2015-02-09 DIAGNOSIS — Z7984 Long term (current) use of oral hypoglycemic drugs: Secondary | ICD-10-CM | POA: Insufficient documentation

## 2015-02-09 DIAGNOSIS — F0390 Unspecified dementia without behavioral disturbance: Secondary | ICD-10-CM | POA: Insufficient documentation

## 2015-02-09 DIAGNOSIS — Z79899 Other long term (current) drug therapy: Secondary | ICD-10-CM | POA: Insufficient documentation

## 2015-02-09 DIAGNOSIS — M8580 Other specified disorders of bone density and structure, unspecified site: Secondary | ICD-10-CM | POA: Diagnosis not present

## 2015-02-09 DIAGNOSIS — S4991XA Unspecified injury of right shoulder and upper arm, initial encounter: Secondary | ICD-10-CM | POA: Diagnosis not present

## 2015-02-09 DIAGNOSIS — Y9389 Activity, other specified: Secondary | ICD-10-CM | POA: Insufficient documentation

## 2015-02-09 DIAGNOSIS — I10 Essential (primary) hypertension: Secondary | ICD-10-CM | POA: Insufficient documentation

## 2015-02-09 DIAGNOSIS — W19XXXA Unspecified fall, initial encounter: Secondary | ICD-10-CM

## 2015-02-09 DIAGNOSIS — Z794 Long term (current) use of insulin: Secondary | ICD-10-CM | POA: Diagnosis not present

## 2015-02-09 DIAGNOSIS — Y9289 Other specified places as the place of occurrence of the external cause: Secondary | ICD-10-CM | POA: Insufficient documentation

## 2015-02-09 DIAGNOSIS — E785 Hyperlipidemia, unspecified: Secondary | ICD-10-CM | POA: Insufficient documentation

## 2015-02-09 DIAGNOSIS — Y998 Other external cause status: Secondary | ICD-10-CM | POA: Diagnosis not present

## 2015-02-09 DIAGNOSIS — K219 Gastro-esophageal reflux disease without esophagitis: Secondary | ICD-10-CM | POA: Diagnosis not present

## 2015-02-09 DIAGNOSIS — E119 Type 2 diabetes mellitus without complications: Secondary | ICD-10-CM | POA: Diagnosis not present

## 2015-02-09 DIAGNOSIS — J449 Chronic obstructive pulmonary disease, unspecified: Secondary | ICD-10-CM | POA: Insufficient documentation

## 2015-02-09 DIAGNOSIS — R911 Solitary pulmonary nodule: Secondary | ICD-10-CM

## 2015-02-09 DIAGNOSIS — Z8669 Personal history of other diseases of the nervous system and sense organs: Secondary | ICD-10-CM | POA: Diagnosis not present

## 2015-02-09 DIAGNOSIS — M1990 Unspecified osteoarthritis, unspecified site: Secondary | ICD-10-CM | POA: Insufficient documentation

## 2015-02-09 DIAGNOSIS — R918 Other nonspecific abnormal finding of lung field: Secondary | ICD-10-CM | POA: Diagnosis not present

## 2015-02-09 LAB — BASIC METABOLIC PANEL
Anion gap: 10 (ref 5–15)
BUN: 12 mg/dL (ref 6–20)
CHLORIDE: 104 mmol/L (ref 101–111)
CO2: 24 mmol/L (ref 22–32)
Calcium: 9.6 mg/dL (ref 8.9–10.3)
Creatinine, Ser: 0.61 mg/dL (ref 0.44–1.00)
GFR calc Af Amer: >60 mL/min (ref >60)
GFR calc non Af Amer: >60 mL/min (ref >60)
GLUCOSE: 284 mg/dL — AB (ref 65–99)
POTASSIUM: 4.5 mmol/L (ref 3.5–5.1)
Sodium: 138 mmol/L (ref 135–145)

## 2015-02-09 LAB — URINALYSIS, ROUTINE W REFLEX MICROSCOPIC
Bilirubin Urine: NEGATIVE
HGB URINE DIPSTICK: NEGATIVE
Ketones, ur: NEGATIVE mg/dL
Leukocytes, UA: NEGATIVE
Nitrite: NEGATIVE
Protein, ur: NEGATIVE mg/dL
SPECIFIC GRAVITY, URINE: 1.04 — AB (ref 1.005–1.030)
pH: 5.5 (ref 5.0–8.0)

## 2015-02-09 LAB — URINE MICROSCOPIC-ADD ON
Bacteria, UA: NONE SEEN
RBC / HPF: NONE SEEN RBC/hpf (ref 0–5)
SQUAMOUS EPITHELIAL / LPF: NONE SEEN
WBC UA: NONE SEEN WBC/hpf (ref 0–5)

## 2015-02-09 LAB — CBC WITH DIFFERENTIAL/PLATELET
Basophils Absolute: 0 10*3/uL (ref 0.0–0.1)
Basophils Relative: 0 %
EOS PCT: 1 %
Eosinophils Absolute: 0.1 10*3/uL (ref 0.0–0.7)
HEMATOCRIT: 44.3 % (ref 36.0–46.0)
HEMOGLOBIN: 14.5 g/dL (ref 12.0–15.0)
LYMPHS ABS: 2.4 10*3/uL (ref 0.7–4.0)
LYMPHS PCT: 18 %
MCH: 31.2 pg (ref 26.0–34.0)
MCHC: 32.7 g/dL (ref 30.0–36.0)
MCV: 95.3 fL (ref 78.0–100.0)
Monocytes Absolute: 0.9 10*3/uL (ref 0.1–1.0)
Monocytes Relative: 7 %
NEUTROS PCT: 74 %
Neutro Abs: 9.7 10*3/uL — ABNORMAL HIGH (ref 1.7–7.7)
Platelets: 231 10*3/uL (ref 150–400)
RBC: 4.65 MIL/uL (ref 3.87–5.11)
RDW: 13.2 % (ref 11.5–15.5)
WBC: 13.1 10*3/uL — AB (ref 4.0–10.5)

## 2015-02-09 LAB — I-STAT TROPONIN, ED: Troponin i, poc: 0 ng/mL (ref 0.00–0.08)
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
## 2015-02-09 NOTE — ED Notes (Signed)
Patient to CT and radiology

## 2015-02-09 NOTE — ED Notes (Signed)
Patient stated she needed to use restroom, took patient to restroom via [place]. Patient unable to urinate at this time.

## 2015-02-09 NOTE — Discharge Instructions (Signed)
Your imaging studies and labs today were normal. There was a nodule found on your chest x-ray. It was recommended that you have a chest CT, since you have elected to have this done on a outpatient basis for primary care physician can schedule this for you-- recommended to call them tomorrow to schedule this. Return to the ED for new or worsening symptoms.

## 2015-02-09 NOTE — ED Notes (Signed)
Bed: [facility] Expected date:  Expected time:  Means of arrival:  Comments: 

## 2015-02-10 ENCOUNTER — Telehealth: Payer: Self-pay | Admitting: Internal Medicine

## 2015-02-10 ENCOUNTER — Other Ambulatory Visit: Payer: Self-pay | Admitting: Internal Medicine

## 2015-02-10 NOTE — Telephone Encounter (Signed)
I will have it ready by that time

## 2015-02-10 NOTE — Telephone Encounter (Signed)
Denise [name] called and asked if the letter she discussed with Denise [name] at patient's last office visit, is ready.  Patient has appointment with Denise [name] on 02/11/15 and [name] would like to pick up the letter when patient comes in.

## 2015-02-11 ENCOUNTER — Ambulatory Visit: Payer: Medicare Other | Admitting: Internal Medicine

## 2015-02-17 ENCOUNTER — Encounter: Payer: Self-pay | Admitting: Internal Medicine

## 2015-02-17 ENCOUNTER — Ambulatory Visit (INDEPENDENT_AMBULATORY_CARE_PROVIDER_SITE_OTHER): Payer: Medicare Other | Admitting: Internal Medicine

## 2015-02-17 VITALS — BP 136/68 | HR 71 | Temp 98.3°F | Wt 186.0 lb

## 2015-02-17 DIAGNOSIS — H9193 Unspecified hearing loss, bilateral: Secondary | ICD-10-CM | POA: Diagnosis not present

## 2015-02-17 DIAGNOSIS — R911 Solitary pulmonary nodule: Secondary | ICD-10-CM | POA: Diagnosis not present

## 2015-02-17 DIAGNOSIS — M25511 Pain in right shoulder: Secondary | ICD-10-CM

## 2015-02-17 DIAGNOSIS — Y92009 Unspecified place in unspecified non-institutional (private) residence as the place of occurrence of the external cause: Secondary | ICD-10-CM

## 2015-02-17 DIAGNOSIS — W19XXXD Unspecified fall, subsequent encounter: Secondary | ICD-10-CM

## 2015-02-17 DIAGNOSIS — K921 Melena: Secondary | ICD-10-CM

## 2015-02-17 NOTE — Progress Notes (Signed)
Pre visit review using our clinic review tool, if applicable. No additional management support is needed unless otherwise documented below in the visit note. 
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
## 2015-02-17 NOTE — Patient Instructions (Signed)
Pulmonary Nodule A pulmonary nodule is a small, round growth of tissue in the lung. Pulmonary nodules can range in size from less than 1/5 inch (4 mm) to a little bigger than an inch (25 mm). Most pulmonary nodules are detected when imaging tests of the lung are being performed for a different problem. Pulmonary nodules are usually not cancerous (benign). However, some pulmonary nodules are cancerous (malignant). Follow-up treatment or testing is based on the size of the pulmonary nodule and your risk of getting lung cancer.  CAUSES Benign pulmonary nodules can be caused by various things. Some of the causes include:   Bacterial, fungal, or viral infections. This is usually an old infection that is no longer active, but it can sometimes be a current, active infection.  A benign mass of tissue.  Inflammation from conditions such as rheumatoid arthritis.   Abnormal blood vessels in the lungs. Malignant pulmonary nodules can result from lung cancer or from cancers that spread to the lung from other places in the body. SIGNS AND SYMPTOMS Pulmonary nodules usually do not cause symptoms. DIAGNOSIS Most often, pulmonary nodules are found incidentally when an X-ray or CT scan is performed to look for some other problem in the lung area. To help determine whether a pulmonary nodule is benign or malignant, your health care provider will take a medical history and order a variety of tests. Tests done may include:   Blood tests.  A skin test called a tuberculin test. This test is used to determine if you have been exposed to the germ that causes tuberculosis.   Chest X-rays. If possible, a new X-ray may be compared with X-rays you have had in the past.   CT scan. This test shows smaller pulmonary nodules more clearly than an X-ray.   Positron emission tomography (PET) scan. In this test, a safe amount of a radioactive substance is injected into the bloodstream. Then, the scan takes a picture of  the pulmonary nodule. The radioactive substance is eliminated from your body in your urine.   Biopsy. A tiny piece of the pulmonary nodule is removed so it can be checked under a microscope. TREATMENT  Pulmonary nodules that are benign normally do not require any treatment because they usually do not cause symptoms or breathing problems. Your health care provider may want to monitor the pulmonary nodule through follow-up CT scans. The frequency of these CT scans will vary based on the size of the nodule and the risk factors for lung cancer. For example, CT scans will need to be done more frequently if the pulmonary nodule is larger and if you have a history of smoking and a family history of cancer. Further testing or biopsies may be done if any follow-up CT scan shows that the size of the pulmonary nodule has increased. HOME CARE INSTRUCTIONS  Only take over-the-counter or prescription medicines as directed by your health care provider.  Keep all follow-up appointments with your health care provider. SEEK MEDICAL CARE IF:  You have trouble breathing when you are active.   You feel sick or unusually tired.   You do not feel like eating.   You lose weight without trying to.   You develop chills or night sweats.  SEEK IMMEDIATE MEDICAL CARE IF:  You cannot catch your breath, or you begin wheezing.   You cannot stop coughing.   You cough up blood.   You become dizzy or feel like you are going to pass out.   You   have sudden chest pain.   You have a fever or persistent symptoms for more than 2-3 days.   You have a fever and your symptoms suddenly get worse. MAKE SURE YOU:  Understand these instructions.  Will watch your condition.  Will get help right away if you are not doing well or get worse.   This information is not intended to replace advice given to you by your health care provider. Make sure you discuss any questions you have with your health care  provider.   Document Released: 11/01/2008 Document Revised: 09/06/2012 Document Reviewed: 06/26/2012 Elsevier Interactive Patient Education 2016 [employer]vier Inc.  

## 2015-02-18 ENCOUNTER — Encounter: Payer: Self-pay | Admitting: Gastroenterology

## 2015-02-21 ENCOUNTER — Inpatient Hospital Stay: Admission: RE | Admit: 2015-02-21 | Payer: Medicare Other | Source: Ambulatory Visit

## 2015-02-22 ENCOUNTER — Other Ambulatory Visit: Payer: Self-pay | Admitting: Internal Medicine

## 2015-02-26 ENCOUNTER — Telehealth: Payer: Self-pay | Admitting: Internal Medicine

## 2015-02-26 ENCOUNTER — Inpatient Hospital Stay: Admission: RE | Admit: 2015-02-26 | Payer: Medicare Other | Source: Ambulatory Visit

## 2015-02-26 NOTE — Telephone Encounter (Signed)
Patient didn't show for her CT appointment.  Denise [name] tried to call patient 3 times today with no response. Stacy confirmed appointment with patient's daughter yesterday.

## 2015-02-26 NOTE — Telephone Encounter (Signed)
noted 

## 2015-02-28 ENCOUNTER — Telehealth: Payer: Self-pay

## 2015-02-28 NOTE — Telephone Encounter (Signed)
Form completed and faxed to [phone]. Cop sent to scan and will hold onto a copy in my form folder

## 2015-03-18 ENCOUNTER — Telehealth: Payer: Self-pay

## 2015-03-18 NOTE — Telephone Encounter (Signed)
pts daughter in law left v/m requesting in home care for ADL for bathing; ? Home health.[name] pts daughter in law and care giver request cb.

## 2015-03-19 ENCOUNTER — Other Ambulatory Visit: Payer: Self-pay | Admitting: Internal Medicine

## 2015-03-19 DIAGNOSIS — F0391 Unspecified dementia with behavioral disturbance: Secondary | ICD-10-CM

## 2015-03-19 DIAGNOSIS — R296 Repeated falls: Secondary | ICD-10-CM

## 2015-03-19 DIAGNOSIS — F03918 Unspecified dementia, unspecified severity, with other behavioral disturbance: Secondary | ICD-10-CM

## 2015-03-19 NOTE — Telephone Encounter (Signed)
Home health ordered.

## 2015-03-20 ENCOUNTER — Other Ambulatory Visit: Payer: Self-pay | Admitting: Internal Medicine

## 2015-03-20 DIAGNOSIS — F0390 Unspecified dementia without behavioral disturbance: Secondary | ICD-10-CM

## 2015-04-01 ENCOUNTER — Telehealth: Payer: Self-pay | Admitting: Internal Medicine

## 2015-04-01 NOTE — Telephone Encounter (Signed)
erin @ [facility] Called she needs a order For home health  PT 2x week for 6 weeks

## 2015-04-01 NOTE — Telephone Encounter (Signed)
Ok for verbal order  °

## 2015-04-02 NOTE — Telephone Encounter (Signed)
Left message on voicemail with verbal order 

## 2015-04-08 ENCOUNTER — Ambulatory Visit: Payer: Medicare Other | Admitting: Gastroenterology

## 2015-04-11 ENCOUNTER — Telehealth: Payer: Self-pay

## 2015-04-11 NOTE — Telephone Encounter (Signed)
Denise PeonErin [name] with [facility] left v/m requesting verbal order for home health social worker; [name] request social worker for eval and treat; [name] wants to be enrolled in [facility] program.

## 2015-04-14 NOTE — Telephone Encounter (Signed)
Left message on voicemail w/ verbal order 

## 2015-04-14 NOTE — Telephone Encounter (Signed)
Ok for social worker

## 2015-04-21 ENCOUNTER — Encounter: Payer: Self-pay | Admitting: Internal Medicine

## 2015-04-21 ENCOUNTER — Ambulatory Visit (INDEPENDENT_AMBULATORY_CARE_PROVIDER_SITE_OTHER): Payer: Medicare Other | Admitting: Internal Medicine

## 2015-04-21 ENCOUNTER — Telehealth: Payer: Self-pay

## 2015-04-21 ENCOUNTER — Encounter (INDEPENDENT_AMBULATORY_CARE_PROVIDER_SITE_OTHER): Payer: Self-pay

## 2015-04-21 VITALS — BP 110/68 | HR 73 | Temp 98.9°F | Wt 196.0 lb

## 2015-04-21 DIAGNOSIS — I10 Essential (primary) hypertension: Secondary | ICD-10-CM

## 2015-04-21 DIAGNOSIS — K219 Gastro-esophageal reflux disease without esophagitis: Secondary | ICD-10-CM

## 2015-04-21 DIAGNOSIS — E785 Hyperlipidemia, unspecified: Secondary | ICD-10-CM | POA: Diagnosis not present

## 2015-04-21 DIAGNOSIS — M170 Bilateral primary osteoarthritis of knee: Secondary | ICD-10-CM

## 2015-04-21 DIAGNOSIS — M810 Age-related osteoporosis without current pathological fracture: Secondary | ICD-10-CM

## 2015-04-21 DIAGNOSIS — E1165 Type 2 diabetes mellitus with hyperglycemia: Secondary | ICD-10-CM | POA: Diagnosis not present

## 2015-04-21 DIAGNOSIS — R300 Dysuria: Secondary | ICD-10-CM

## 2015-04-21 DIAGNOSIS — F0390 Unspecified dementia without behavioral disturbance: Secondary | ICD-10-CM

## 2015-04-21 DIAGNOSIS — N3281 Overactive bladder: Secondary | ICD-10-CM

## 2015-04-21 DIAGNOSIS — Z794 Long term (current) use of insulin: Secondary | ICD-10-CM

## 2015-04-21 DIAGNOSIS — I480 Paroxysmal atrial fibrillation: Secondary | ICD-10-CM

## 2015-04-21 DIAGNOSIS — J4520 Mild intermittent asthma, uncomplicated: Secondary | ICD-10-CM

## 2015-04-21 LAB — POC URINALSYSI DIPSTICK (AUTOMATED)
Bilirubin, UA: NEGATIVE
Glucose, UA: 2000
Ketones, UA: NEGATIVE
Leukocytes, UA: NEGATIVE
Nitrite, UA: NEGATIVE
PH UA: 5.5
UROBILINOGEN UA: NEGATIVE

## 2015-04-21 NOTE — Telephone Encounter (Signed)
Denise MemosLindsey [name] with [facility] said for one week having loose stools 4 - 10 per day on and off. Denise LaymanLindsey is not sure last time had diarrhea and wanted to know is it OK for pt to take lomotil Denise LaymanLindsey request cb.

## 2015-04-21 NOTE — Patient Instructions (Signed)

## 2015-04-21 NOTE — Assessment & Plan Note (Signed)
Continue Tramadol prn 

## 2015-04-21 NOTE — Progress Notes (Signed)
Pre visit review using our clinic review tool, if applicable. No additional management support is needed unless otherwise documented below in the visit note. 

## 2015-04-21 NOTE — Assessment & Plan Note (Signed)
Having diarrhea with Metformin Will check A1C today No microalbumin secondary to ACEI therapy Foot exam today Flu and pneumonia vaccines UTD Continue Glipizide, Tradjenta, Lantus Consider stopping Metformin due to diarrhea, but will await labs

## 2015-04-21 NOTE — Assessment & Plan Note (Signed)
Worse Continue Aricept and Namenda Referral placed for Neurology for further eval

## 2015-04-21 NOTE — Addendum Note (Signed)
Addended by: Roena MaladyEVONTENNO, Marqueze Ramcharan Y on: 04/21/2015 05:04 PM   Modules accepted: Orders

## 2015-04-21 NOTE — Assessment & Plan Note (Signed)
Continue Albuterol PRN 

## 2015-04-21 NOTE — Telephone Encounter (Signed)
I would not have pt take Lomotil in case this is infectious diarrhea.

## 2015-04-21 NOTE — Assessment & Plan Note (Signed)
Will check Lipid Profile and CMET today Continue Lipitor unless directed otherwise Encouraged her to consume a low fat diet

## 2015-04-21 NOTE — Assessment & Plan Note (Signed)
Rhythm and rate controlled On ASA only for anticoag due to fall risk

## 2015-04-21 NOTE — Assessment & Plan Note (Signed)
Continue Ditropan 

## 2015-04-21 NOTE — Assessment & Plan Note (Signed)
Continue Fosamax  

## 2015-04-21 NOTE — Telephone Encounter (Signed)
Pt was seen in office today

## 2015-04-21 NOTE — Assessment & Plan Note (Signed)
Controlled on Ramipril

## 2015-04-21 NOTE — Progress Notes (Signed)
HPI:  Pt presents to the clinic today for follow up of chronic conditions.  Asthma/COPD: She reports she is breathing fine. She does not really use the Albuterol. She has not had PFT's and she does not follow with a pulmonologist.  GERD: She takes Zantac daily. She denies breakthrough symptoms.  Arthritis: She is not currently complaining of any pain. She has Tramadol to take only when needed.  DM 2: Her last A1C was 8.4%. Her fasting sugars are ranging 99-199. She takes Metformin, Glyburide, Tradjenta and Lantus (40 units daily). Her last eye exam was within the last year with Dr. Gershon Crane. Flu 10/2013. Pneumovax 10/2014. Prevnar 02/2013. Flu: 10/2014.  HTN: Her BP is well controlled on Ramipril. Her BP today is 110/68. ECG from 02/2014 reviewed.  HLD: Her last LDL was 118. She is taking Lipitor as prescribed. She denies myalgias. She does try to consume a low fat diet.  Afib: Paroxysmal. She is not on any medications for this. ASA only for antiocoag.  Osteopenia: Bone Density from 2012 reviewed. She did have evidence of Osteoporosis. She is on Fosamax.  OAB: She reports more urinary frequency. She takes Ditropan as directed..  Memory Impairment: Her daughter in law is concerned about worsening memory impairment. She has terrible short term memory. She can not cook for herself. She needs help dressing and bathing. She has someone administer her medications for her. She no longer drives. She takes Aricept and Namenda but her family has not noticed a difference.    Past Medical History  Diagnosis Date  . Asthma   . COPD (chronic obstructive pulmonary disease) ([place])   . GERD (gastroesophageal reflux disease)   . Arthritis   . Diabetes mellitus   . Hyperlipidemia   . Hypertension   . Hx of atrial fibrillation, no current medication   . Osteopenia   . Asthma   . Cataracts, bilateral 12/2010  . Plantar fasciitis 2010  . Carpal tunnel syndrome 09/2006    bilateral     Current  Outpatient Prescriptions  Medication Sig Dispense Refill  . albuterol (PROVENTIL HFA;VENTOLIN HFA) 108 (90 BASE) MCG/ACT inhaler Inhale 2 puffs into the lungs every 6 (six) hours as needed. 3 Inhaler 0  . alendronate (FOSAMAX) 70 MG tablet TAKE 1 TABLET BY MOUTH ONCE A WEEK WITH FULL GLASS OF WATER ON EMPTY STOMACH 12 tablet 0  . atorvastatin (LIPITOR) 20 MG tablet Take 1 tablet (20 mg total) by mouth daily. 90 tablet 2  . BD PEN NEEDLE NANO U/F 32G X 4 MM MISC USE DAILY AS DIRECTED 100 each 6  . Blood Glucose Monitoring Suppl (ACCU-CHEK NANO SMARTVIEW) W/DEVICE KIT by Does not apply route.    . Cholecalciferol (VITAMIN D) 2000 UNITS CAPS Take 1 capsule by mouth daily.    Marland Kitchen donepezil (ARICEPT) 23 MG TABS tablet TAKE 1 TABLET BY MOUTH ATBEDTIME. 90 tablet 1  . glipiZIDE (GLUCOTROL) 5 MG tablet Take 1 tablet (5 mg total) by mouth 2 (two) times daily before a meal. 180 tablet 1  . glucose blood (ACCU-CHEK SMARTVIEW) test strip 1 each by Other route 2 (two) times daily. 100 each 6  . Insulin Glargine (LANTUS SOLOSTAR) 100 UNIT/ML Solostar Pen Inject 40 Units into the skin daily at 10 pm. 45 mL 3  . linagliptin (TRADJENTA) 5 MG TABS tablet Take 1 tablet (5 mg total) by mouth daily. 90 tablet 2  . memantine (NAMENDA) 5 MG tablet Take 1 tablet (5 mg total) by mouth 2 (two)  times daily. 180 tablet 1  . metFORMIN (GLUCOPHAGE) 500 MG tablet Take 1 tablet (500 mg total) by mouth 2 (two) times daily with a meal. 180 tablet 2  . multivitamin (THERAGRAN) per tablet Take 1 tablet by mouth daily.      Marland Kitchen oxybutynin (DITROPAN XL) 15 MG 24 hr tablet Take 1 tablet (15 mg total) by mouth at bedtime. 30 tablet 2  . ramipril (ALTACE) 10 MG capsule Take 1 capsule (10 mg total) by mouth daily. 90 capsule 3  . ranitidine (ZANTAC) 150 MG tablet Take 1 tablet (150 mg total) by mouth 2 (two) times daily. 180 tablet 3  . traMADol (ULTRAM) 50 MG tablet Take 1 tablet (50 mg total) by mouth every 8 (eight) hours as needed for  moderate pain. 90 tablet 0   No current facility-administered medications for this visit.    Allergies  Allergen Reactions  . Codeine Other (See Comments)    unknown  . Sulfonamide Derivatives Other (See Comments)    unknown    Family History  Problem Relation Age of Onset  . Heart disease Mother   . Cancer Mother     ovarian  . Diabetes Neg Hx   . Stroke Neg Hx     Social History   Social History  . Marital Status: Married    Spouse Name: N/A  . Number of Children: N/A  . Years of Education: N/A   Occupational History  . Not on file.   Social History Main Topics  . Smoking status: Never Smoker   . Smokeless tobacco: Not on file  . Alcohol Use: No  . Drug Use: No  . Sexual Activity:    Partners: Male    Birth Control/ Protection: Post-menopausal   Other Topics Concern  . Not on file   Social History Narrative     Subjective:   Review of Systems:   Constitutional: Pt reports fatigue. Denies fever, malaise, headache or abrupt weight changes.  HEENT: Denies eye pain, eye redness, ear pain, ringing in the ears, wax buildup, runny nose, nasal congestion, bloody nose, or sore throat. Respiratory: Denies difficulty breathing, shortness of breath, cough or sputum production.   Cardiovascular: Denies chest pain, chest tightness, palpitations or swelling in the hands or feet.  Gastrointestinal: Pt reports diarrhea. Denies abdominal pain, bloating, constipation, or blood in the stool.  GU: Pt reports urinary incontinence. Denies urgency, frequency, pain with urination, burning sensation, blood in urine, odor or discharge. Musculoskeletal: Pt reports difficulty with gait. Denies decrease in range of motion, muscle pain or joint pain and swelling.  Skin: Denies redness, rashes, lesions or ulcercations.  Neurological: Pt reports difficulty with memory. Denies dizziness, difficulty with speech or problems with balance and coordination.  Psych: Denies anxiety,  depression, SI/HI.  No other specific complaints in a complete review of systems (except as listed in HPI above).  Objective:  PE:  BP 110/68 mmHg  Pulse 73  Temp(Src) 98.9 F (37.2 C) (Oral)  Wt 196 lb (88.905 kg)  SpO2 98%  LMP 01/19/1996   Wt Readings from Last 3 Encounters:  02/17/15 186 lb (84.369 kg)  02/06/15 190 lb (86.183 kg)  11/26/14 197 lb (89.359 kg)    General: Appears her stated age, chronically ill appearing, in NAD. Neck: Neck supple, trachea midline. No masses, lumps or thyromegaly present.  Cardiovascular: Normal rate and rhythm. S1,S2 noted.  No murmur, rubs or gallops noted. No JVD. Trace BLE edema.  Pulmonary/Chest: Normal effort and positive  vesicular breath sounds. No respiratory distress. No wheezes, rales or ronchi noted.  Abdomen: Soft and nontender.  Musculoskeletal: Gait slow. She should be walking with a cane, walker- currently working with PT. Neurological: Oriented to person. She is very confused. Psychiatric: Mood and affect flat.     BMET    Component Value Date/Time   NA 138 02/09/2015 1657   K 4.5 02/09/2015 1657   CL 104 02/09/2015 1657   CO2 24 02/09/2015 1657   GLUCOSE 284* 02/09/2015 1657   BUN 12 02/09/2015 1657   CREATININE 0.61 02/09/2015 1657   CALCIUM 9.6 02/09/2015 1657   GFRNONAA >60 02/09/2015 1657   GFRAA >60 02/09/2015 1657    Lipid Panel     Component Value Date/Time   CHOL 218* 10/24/2014 1536   TRIG 208.0* 10/24/2014 1536   HDL 66.20 10/24/2014 1536   CHOLHDL 3 10/24/2014 1536   VLDL 41.6* 10/24/2014 1536   LDLCALC 56 12/24/2013 1054    CBC    Component Value Date/Time   WBC 13.1* 02/09/2015 1657   RBC 4.65 02/09/2015 1657   HGB 14.5 02/09/2015 1657   HCT 44.3 02/09/2015 1657   PLT 231 02/09/2015 1657   MCV 95.3 02/09/2015 1657   MCH 31.2 02/09/2015 1657   MCHC 32.7 02/09/2015 1657   RDW 13.2 02/09/2015 1657   LYMPHSABS 2.4 02/09/2015 1657   MONOABS 0.9 02/09/2015 1657   EOSABS 0.1 02/09/2015  1657   BASOSABS 0.0 02/09/2015 1657    Hgb A1C Lab Results  Component Value Date   HGBA1C 8.4* 10/24/2014      Assessment and Plan:

## 2015-04-21 NOTE — Assessment & Plan Note (Signed)
Continue Zantac daily Discussed how weight loss would help improve her reflux

## 2015-04-21 NOTE — Addendum Note (Signed)
Addended by: Alvina ChouWALSH, TERRI J on: 04/21/2015 04:26 PM   Modules accepted: Orders

## 2015-04-22 ENCOUNTER — Ambulatory Visit: Payer: Medicare Other | Admitting: Internal Medicine

## 2015-04-22 ENCOUNTER — Other Ambulatory Visit: Payer: Self-pay | Admitting: Internal Medicine

## 2015-04-22 LAB — COMPREHENSIVE METABOLIC PANEL
ALK PHOS: 56 U/L (ref 39–117)
ALT: 17 U/L (ref 0–35)
AST: 16 U/L (ref 0–37)
Albumin: 3.9 g/dL (ref 3.5–5.2)
BILIRUBIN TOTAL: 0.9 mg/dL (ref 0.2–1.2)
BUN: 13 mg/dL (ref 6–23)
CO2: 27 meq/L (ref 19–32)
Calcium: 9.8 mg/dL (ref 8.4–10.5)
Chloride: 102 mEq/L (ref 96–112)
Creatinine, Ser: 0.69 mg/dL (ref 0.40–1.20)
GFR: 88.01 mL/min (ref >60.00)
GLUCOSE: 276 mg/dL — AB (ref 70–99)
POTASSIUM: 4.7 meq/L (ref 3.5–5.1)
SODIUM: 138 meq/L (ref 135–145)
TOTAL PROTEIN: 6.9 g/dL (ref 6.0–8.3)

## 2015-04-22 LAB — LIPID PANEL
Cholesterol: 134 mg/dL (ref 0–200)
HDL: 50.1 mg/dL (ref >39.00)
LDL Cholesterol: 60 mg/dL (ref 0–99)
NonHDL: 83.69
TRIGLYCERIDES: 118 mg/dL (ref 0.0–149.0)
Total CHOL/HDL Ratio: 3
VLDL: 23.6 mg/dL (ref 0.0–40.0)

## 2015-04-22 LAB — HEMOGLOBIN A1C: Hgb A1c MFr Bld: 9.1 % — ABNORMAL HIGH (ref 4.6–6.5)

## 2015-04-22 LAB — CBC
HCT: 44 % (ref 36.0–46.0)
HEMOGLOBIN: 14.6 g/dL (ref 12.0–15.0)
MCHC: 33.1 g/dL (ref 30.0–36.0)
MCV: 91.7 fl (ref 78.0–100.0)
Platelets: 274 10*3/uL (ref 150.0–400.0)
RBC: 4.8 Mil/uL (ref 3.87–5.11)
RDW: 13.9 % (ref 11.5–15.5)
WBC: 13.4 10*3/uL — AB (ref 4.0–10.5)

## 2015-04-22 MED ORDER — METFORMIN HCL ER 750 MG PO TB24: 750.0000 mg | ORAL_TABLET | Freq: Two times a day (BID) | ORAL | Status: DC

## 2015-04-22 MED ORDER — GLIPIZIDE 10 MG PO TABS: 10.0000 mg | ORAL_TABLET | Freq: Two times a day (BID) | ORAL | Status: DC

## 2015-04-26 ENCOUNTER — Other Ambulatory Visit: Payer: Self-pay | Admitting: Internal Medicine

## 2015-05-07 ENCOUNTER — Other Ambulatory Visit: Payer: Self-pay | Admitting: Internal Medicine

## 2015-05-21 ENCOUNTER — Ambulatory Visit: Payer: Medicare Other | Admitting: Neurology

## 2015-05-21 DIAGNOSIS — Z029 Encounter for administrative examinations, unspecified: Secondary | ICD-10-CM

## 2015-06-02 ENCOUNTER — Encounter (HOSPITAL_COMMUNITY): Payer: Self-pay

## 2015-06-02 ENCOUNTER — Emergency Department (HOSPITAL_COMMUNITY)
Admission: EM | Admit: 2015-06-02 | Discharge: 2015-06-03 | Disposition: A | Discharge status: Home / Self Care | Payer: Medicare Other | Attending: Emergency Medicine | Admitting: Emergency Medicine

## 2015-06-02 DIAGNOSIS — Z7984 Long term (current) use of oral hypoglycemic drugs: Secondary | ICD-10-CM | POA: Diagnosis not present

## 2015-06-02 DIAGNOSIS — Z79891 Long term (current) use of opiate analgesic: Secondary | ICD-10-CM | POA: Diagnosis not present

## 2015-06-02 DIAGNOSIS — I4891 Unspecified atrial fibrillation: Secondary | ICD-10-CM | POA: Insufficient documentation

## 2015-06-02 DIAGNOSIS — Y999 Unspecified external cause status: Secondary | ICD-10-CM | POA: Insufficient documentation

## 2015-06-02 DIAGNOSIS — M8580 Other specified disorders of bone density and structure, unspecified site: Secondary | ICD-10-CM | POA: Diagnosis not present

## 2015-06-02 DIAGNOSIS — S51811A Laceration without foreign body of right forearm, initial encounter: Secondary | ICD-10-CM

## 2015-06-02 DIAGNOSIS — Z794 Long term (current) use of insulin: Secondary | ICD-10-CM | POA: Insufficient documentation

## 2015-06-02 DIAGNOSIS — J449 Chronic obstructive pulmonary disease, unspecified: Secondary | ICD-10-CM | POA: Diagnosis not present

## 2015-06-02 DIAGNOSIS — I10 Essential (primary) hypertension: Secondary | ICD-10-CM | POA: Insufficient documentation

## 2015-06-02 DIAGNOSIS — E119 Type 2 diabetes mellitus without complications: Secondary | ICD-10-CM | POA: Diagnosis not present

## 2015-06-02 DIAGNOSIS — Z79899 Other long term (current) drug therapy: Secondary | ICD-10-CM | POA: Diagnosis not present

## 2015-06-02 DIAGNOSIS — Y939 Activity, unspecified: Secondary | ICD-10-CM | POA: Diagnosis not present

## 2015-06-02 DIAGNOSIS — K219 Gastro-esophageal reflux disease without esophagitis: Secondary | ICD-10-CM | POA: Diagnosis not present

## 2015-06-02 DIAGNOSIS — Z7951 Long term (current) use of inhaled steroids: Secondary | ICD-10-CM | POA: Insufficient documentation

## 2015-06-02 DIAGNOSIS — E785 Hyperlipidemia, unspecified: Secondary | ICD-10-CM | POA: Diagnosis not present

## 2015-06-02 DIAGNOSIS — M1990 Unspecified osteoarthritis, unspecified site: Secondary | ICD-10-CM | POA: Insufficient documentation

## 2015-06-02 DIAGNOSIS — Z7983 Long term (current) use of bisphosphonates: Secondary | ICD-10-CM | POA: Diagnosis not present

## 2015-06-02 DIAGNOSIS — Y929 Unspecified place or not applicable: Secondary | ICD-10-CM | POA: Diagnosis not present

## 2015-06-02 DIAGNOSIS — W0110XA Fall on same level from slipping, tripping and stumbling with subsequent striking against unspecified object, initial encounter: Secondary | ICD-10-CM | POA: Diagnosis not present

## 2015-06-02 MED ORDER — LIDOCAINE HCL (PF) 1 % IJ SOLN: 5.0000 mL | Freq: Once | INTRAMUSCULAR | Status: DC

## 2015-06-02 MED ORDER — LIDOCAINE HCL 1 % IJ SOLN
5.0000 mL | Freq: Once | INTRAMUSCULAR | Status: AC
  Administered 2015-06-02: 5 mL
  Filled 2015-06-02: qty 20

## 2015-06-02 NOTE — ED Provider Notes (Signed)
CSN: [id number]     Arrival date & time 06/02/15  2100 History  By signing my name below, I, Doran Stabler, attest that this documentation has been prepared under the direction and in the presence of Charlann Lange, PA-C. Electronically Signed: Doran Stabler, ED Scribe. 06/02/2015. 10:36 PM.   Chief Complaint  Patient presents with  . Fall   The history is provided by the patient and a relative. No language interpreter was used.   HPI Comments: Denise [name] is a 75 y.o. female with a PMHx of HTN and DM who presents to the Emergency Department complaining of right forearm laceration with no active bleeding s/p mechanical fall less than one hour ago. Daughter states the pt slipped on cooking grease and hit her forearm on a metal table. Pt denies any LOC, neck pain, back pain, CP, SOB, abd pain, N/V/D or any other symptoms at this time. Pt is not on blood thinners.  Pt last tetanus injection was 1 year ago at [facility] as per daughter.   Past Medical History  Diagnosis Date  . Asthma   . COPD (chronic obstructive pulmonary disease) ([place])   . GERD (gastroesophageal reflux disease)   . Arthritis   . Diabetes mellitus   . Hyperlipidemia   . Hypertension   . Hx of atrial fibrillation, no current medication   . Osteopenia   . Asthma   . Cataracts, bilateral 12/2010  . Plantar fasciitis 2010  . Carpal tunnel syndrome 09/2006    bilateral    Past Surgical History  Procedure Laterality Date  . Colopnoscopy  05/17/2001  . Tonsillectomy    . Oophorectomy cyst    . Cardiac ablation in 2001 for a fib    . Carpal tunnel release Bilateral 09/2006  . Breast biopsy Left 2001    Family History  Problem Relation Age of Onset  . Heart disease Mother   . Cancer Mother     ovarian  . Diabetes Neg Hx   . Stroke Neg Hx    Social History  Substance Use Topics  . Smoking status: Never Smoker   . Smokeless tobacco: None  . Alcohol Use: No   OB History    Gravida Para Term Preterm AB TAB SAB  Ectopic Multiple Living   1    1  1         Review of Systems  Respiratory: Negative for shortness of breath.   Cardiovascular: Negative for chest pain.  Gastrointestinal: Negative for nausea, vomiting, abdominal pain and diarrhea.  Musculoskeletal: Negative for back pain and neck pain.  Skin: Positive for wound (laceration).  Neurological: Negative for syncope.   Allergies  Codeine and Sulfonamide derivatives  Home Medications   Prior to Admission medications   Medication Sig Start Date End Date Taking? Authorizing Provider  albuterol (PROVENTIL HFA;VENTOLIN HFA) 108 (90 BASE) MCG/ACT inhaler Inhale 2 puffs into the lungs every 6 (six) hours as needed. 10/28/14   Jearld Fenton, NP  alendronate (FOSAMAX) 70 MG tablet TAKE 1 TABLET BY MOUTH ONCE A WEEK WITH FULL GLASS OF WATER ON EMPTY STOMACH 05/07/15   Jearld Fenton, NP  atorvastatin (LIPITOR) 20 MG tablet Take 1 tablet (20 mg total) by mouth daily. 10/28/14   Jearld Fenton, NP  BD PEN NEEDLE NANO U/F 32G X 4 MM MISC USE DAILY AS DIRECTED 12/31/14   Jearld Fenton, NP  Blood Glucose Monitoring Suppl (ACCU-CHEK NANO SMARTVIEW) W/DEVICE KIT by Does not apply route.  Historical Provider, MD  Cholecalciferol (VITAMIN D) 2000 UNITS CAPS Take 1 capsule by mouth daily.    Historical Provider, MD  donepezil (ARICEPT) 23 MG TABS tablet TAKE 1 TABLET BY MOUTH ATBEDTIME. 12/23/14   Jearld Fenton, NP  glipiZIDE (GLUCOTROL) 10 MG tablet Take 1 tablet (10 mg total) by mouth 2 (two) times daily before a meal. 04/22/15   Jearld Fenton, NP  glucose blood (ACCU-CHEK SMARTVIEW) test strip 1 each by Other route 2 (two) times daily. 11/25/14   Jearld Fenton, NP  Insulin Glargine (LANTUS SOLOSTAR) 100 UNIT/ML Solostar Pen Inject 40 Units into the skin daily at 10 pm. 10/28/14   Jearld Fenton, NP  linagliptin (TRADJENTA) 5 MG TABS tablet Take 1 tablet (5 mg total) by mouth daily. 10/28/14   Jearld Fenton, NP  memantine (NAMENDA) 5 MG tablet Take 1 tablet  (5 mg total) by mouth 2 (two) times daily. 02/06/15   Jearld Fenton, NP  metFORMIN (GLUCOPHAGE-XR) 750 MG 24 hr tablet Take 1 tablet (750 mg total) by mouth 2 (two) times daily. 04/22/15   Jearld Fenton, NP  multivitamin [facility]) per tablet Take 1 tablet by mouth daily.      Historical Provider, MD  oxybutynin (DITROPAN XL) 15 MG 24 hr tablet Take 1 tablet (15 mg total) by mouth at bedtime. 02/06/15   Jearld Fenton, NP  ramipril (ALTACE) 10 MG capsule Take 1 capsule (10 mg total) by mouth daily. 10/28/14   Jearld Fenton, NP  ranitidine (ZANTAC) 150 MG tablet Take 1 tablet (150 mg total) by mouth 2 (two) times daily. 10/28/14   Jearld Fenton, NP  traMADol (ULTRAM) 50 MG tablet Take 1 tablet (50 mg total) by mouth every 8 (eight) hours as needed for moderate pain. 04/10/14   Meredith Staggers, MD   BP 152/62 mmHg  Pulse 83  Temp(Src) 98.6 F (37 C) (Oral)  Resp 18  SpO2 97%  LMP 01/19/1996   Physical Exam  Constitutional: She is oriented to person, place, and time. She appears well-developed and well-nourished.  HENT:  Head: Normocephalic and atraumatic.  Neck: Normal range of motion. Neck supple. No muscular tenderness present.  Cardiovascular: Normal rate.   Pulmonary/Chest: Effort normal. She exhibits no tenderness.  Abdominal: Soft. There is no tenderness.  Neurological: She is alert and oriented to person, place, and time.  Skin: Skin is warm and dry.  4 cm jagged edged laceration to the posterior right forearm with uneven wound margins; no bony tenderness; full pain free ROM of RUE  Psychiatric: She has a normal mood and affect.  Nursing note and vitals reviewed.  ED Course  Procedures  LACERATION REPAIR PROCEDURE NOTE The patient's identification was confirmed and consent was obtained. This procedure was performed by Charlann Lange, PA-C at 10:52 PM. Site: Right posterior forearm Sterile procedures observedyes Anesthetic used (type and amt): 1% lido plain, 3cc Suture  type/size:4-0 prolene Length:4 cm # of Sutures: 6 Technique:simple interrupted Complexity moderate with wound edge revision Antibx ointment applied yes Tetanus UTD or ordered UTD Site anesthetized, irrigated with NS, explored without evidence of foreign body, wound well approximated, site covered with dry, sterile dressing.  Patient tolerated procedure well without complications. Instructions for care discussed verbally and patient provided with additional written instructions for homecare and f/u.  DIAGNOSTIC STUDIES: Oxygen Saturation is 97% on room air, normal by my interpretation.    COORDINATION OF CARE: 10:35 PM Will preform laceration repair. Discussed  treatment plan with pt at bedside and pt agreed to plan.  Labs Review Labs Reviewed - No data to display  Imaging Review No results found. I have personally reviewed and evaluated these images and lab results as part of my medical decision-making.   EKG Interpretation None      MDM   Final diagnoses:  None    1. Right arm laceration  Uncomplicated right arm laceration after fall. Isolated injury to arm repaired per above note. No head, chest, abdomen or other extremity finding.   I personally performed the services described in this documentation, which was scribed in my presence. The recorded information has been reviewed and is accurate.     Charlann Lange, PA-C 06/03/15 0001  Orlie Dakin, MD 06/03/15 [phone]

## 2015-06-02 NOTE — ED Notes (Signed)
Pt slipped and fell on grease and hit her right forearm on the corner of a table, bleeding controlled at this time

## 2015-06-02 NOTE — Discharge Instructions (Signed)
Laceration Care, Adult  A laceration is a cut that goes through all layers of the skin. The cut also goes into the tissue that is right under the skin. Some cuts heal on their own. Others need to be closed with stitches (sutures), staples, skin adhesive strips, or wound glue. Taking care of your cut lowers your risk of infection and helps your cut to heal better.  HOW TO TAKE CARE OF YOUR CUT  For stitches or staples:  · Keep the wound clean and dry.  · If you were given a bandage (dressing), you should change it at least one time per day or as told by your doctor. You should also change it if it gets wet or dirty.  · Keep the wound completely dry for the first 24 hours or as told by your doctor. After that time, you may take a shower or a bath. However, make sure that the wound is not soaked in water until after the stitches or staples have been removed.  · Clean the wound one time each day or as told by your doctor:    Wash the wound with soap and water.    Rinse the wound with water until all of the soap comes off.    Pat the wound dry with a clean towel. Do not rub the wound.  · After you clean the wound, put a thin layer of antibiotic ointment on it as told by your doctor. This ointment:    Helps to prevent infection.    Keeps the bandage from sticking to the wound.  · Have your stitches or staples removed as told by your doctor.  If your doctor used skin adhesive strips:   · Keep the wound clean and dry.  · If you were given a bandage, you should change it at least one time per day or as told by your doctor. You should also change it if it gets dirty or wet.  · Do not get the skin adhesive strips wet. You can take a shower or a bath, but be careful to keep the wound dry.  · If the wound gets wet, pat it dry with a clean towel. Do not rub the wound.  · Skin adhesive strips fall off on their own. You can trim the strips as the wound heals. Do not remove any strips that are still stuck to the wound. They will  fall off after a while.  If your doctor used wound glue:  · Try to keep your wound dry, but you may briefly wet it in the shower or bath. Do not soak the wound in water, such as by swimming.  · After you take a shower or a bath, gently pat the wound dry with a clean towel. Do not rub the wound.  · Do not do any activities that will make you really sweaty until the skin glue has fallen off on its own.  · Do not apply liquid, cream, or ointment medicine to your wound while the skin glue is still on.  · If you were given a bandage, you should change it at least one time per day or as told by your doctor. You should also change it if it gets dirty or wet.  · If a bandage is placed over the wound, do not let the tape for the bandage touch the skin glue.  · Do not pick at the glue. The skin glue usually stays on for 5-10 days. Then, it   falls off of the skin.  General Instructions   · To help prevent scarring, make sure to cover your wound with sunscreen whenever you are outside after stitches are removed, after adhesive strips are removed, or when wound glue stays in place and the wound is healed. Make sure to wear a sunscreen of at least 30 SPF.  · Take over-the-counter and prescription medicines only as told by your doctor.  · If you were given antibiotic medicine or ointment, take or apply it as told by your doctor. Do not stop using the antibiotic even if your wound is getting better.  · Do not scratch or pick at the wound.  · Keep all follow-up visits as told by your doctor. This is important.  · Check your wound every day for signs of infection. Watch for:    Redness, swelling, or pain.    Fluid, blood, or pus.  · Raise (elevate) the injured area above the level of your heart while you are sitting or lying down, if possible.  GET HELP IF:  · You got a tetanus shot and you have any of these problems at the injection site:    Swelling.    Very bad pain.    Redness.    Bleeding.  · You have a fever.  · A wound that was  closed breaks open.  · You notice a bad smell coming from your wound or your bandage.  · You notice something coming out of the wound, such as wood or glass.  · Medicine does not help your pain.  · You have more redness, swelling, or pain at the site of your wound.  · You have fluid, blood, or pus coming from your wound.  · You notice a change in the color of your skin near your wound.  · You need to change the bandage often because fluid, blood, or pus is coming from the wound.  · You start to have a new rash.  · You start to have numbness around the wound.  GET HELP RIGHT AWAY IF:  · You have very bad swelling around the wound.  · Your pain suddenly gets worse and is very bad.  · You notice painful lumps near the wound or on skin that is anywhere on your body.  · You have a red streak going away from your wound.  · The wound is on your hand or foot and you cannot move a finger or toe like you usually can.  · The wound is on your hand or foot and you notice that your fingers or toes look pale or bluish.     This information is not intended to replace advice given to you by your health care provider. Make sure you discuss any questions you have with your health care provider.     Document Released: 06/23/2007 Document Revised: 05/21/2014 Document Reviewed: 12/31/2013  Elsevier Interactive Patient Education ©2016 [employer]

## 2015-06-03 NOTE — ED Notes (Signed)
Using clean technique, cleansed around the sutures with chlorhexidine. Covered sutures with 2x2 gauze and secured with kerlix and tape.

## 2015-06-09 ENCOUNTER — Encounter: Payer: Self-pay | Admitting: Internal Medicine

## 2015-06-09 ENCOUNTER — Ambulatory Visit (INDEPENDENT_AMBULATORY_CARE_PROVIDER_SITE_OTHER): Payer: Medicare Other | Admitting: Internal Medicine

## 2015-06-09 VITALS — BP 118/60 | HR 78 | Temp 98.7°F

## 2015-06-09 DIAGNOSIS — Z4802 Encounter for removal of sutures: Secondary | ICD-10-CM

## 2015-06-09 DIAGNOSIS — W19XXXD Unspecified fall, subsequent encounter: Secondary | ICD-10-CM

## 2015-06-09 DIAGNOSIS — Y92009 Unspecified place in unspecified non-institutional (private) residence as the place of occurrence of the external cause: Secondary | ICD-10-CM

## 2015-06-09 DIAGNOSIS — S51811D Laceration without foreign body of right forearm, subsequent encounter: Secondary | ICD-10-CM

## 2015-06-09 DIAGNOSIS — Z0289 Encounter for other administrative examinations: Secondary | ICD-10-CM

## 2015-06-09 NOTE — Patient Instructions (Signed)

## 2015-06-09 NOTE — Progress Notes (Signed)
Pre visit review using our clinic review tool, if applicable. No additional management support is needed unless otherwise documented below in the visit note. 

## 2015-06-09 NOTE — Progress Notes (Signed)
Subjective:    Patient ID: Denise [name], female    DOB: 1940/03/06, 75 y.o.   MRN: [id number]  HPI  Pt presents to the clinic today for ER followup. She went to the ER s/p fall at her home. She slipped on grease. She tried to catch herself from falling, and ended up sustaining a laceration to her forearm. The area was sutured in the ER. She did not hit her head and was not complaining of pain in any other areas. No xrays were done. She was advised to follow up with her PCP in 7-10 days for suture removal.  She also has a [place]-112 form that needs completion. Per her daughter in law, she will be moving to [facility] assisted living. Her memory is getting worse, she is incontinent of bladder. She can not prepare her own meals, she needs assistance with walking, bathing, dressing. She has had multiple falls in the last 6 months.  Review of Systems      Past Medical History  Diagnosis Date  . Asthma   . COPD (chronic obstructive pulmonary disease) ([place])   . GERD (gastroesophageal reflux disease)   . Arthritis   . Diabetes mellitus   . Hyperlipidemia   . Hypertension   . Hx of atrial fibrillation, no current medication   . Osteopenia   . Asthma   . Cataracts, bilateral 12/2010  . Plantar fasciitis 2010  . Carpal tunnel syndrome 09/2006    bilateral     Current Outpatient Prescriptions  Medication Sig Dispense Refill  . albuterol (PROVENTIL HFA;VENTOLIN HFA) 108 (90 BASE) MCG/ACT inhaler Inhale 2 puffs into the lungs every 6 (six) hours as needed. 3 Inhaler 0  . alendronate (FOSAMAX) 70 MG tablet TAKE 1 TABLET BY MOUTH ONCE A WEEK WITH FULL GLASS OF WATER ON EMPTY STOMACH 12 tablet 3  . atorvastatin (LIPITOR) 20 MG tablet Take 1 tablet (20 mg total) by mouth daily. 90 tablet 2  . BD PEN NEEDLE NANO U/F 32G X 4 MM MISC USE DAILY AS DIRECTED 100 each 6  . Blood Glucose Monitoring Suppl (ACCU-CHEK NANO SMARTVIEW) W/DEVICE KIT by Does not apply route.    . Cholecalciferol (VITAMIN  D) 2000 UNITS CAPS Take 1 capsule by mouth daily.    Marland Kitchen donepezil (ARICEPT) 23 MG TABS tablet TAKE 1 TABLET BY MOUTH ATBEDTIME. 90 tablet 1  . glipiZIDE (GLUCOTROL) 10 MG tablet Take 1 tablet (10 mg total) by mouth 2 (two) times daily before a meal. 60 tablet 3  . glucose blood (ACCU-CHEK SMARTVIEW) test strip 1 each by Other route 2 (two) times daily. 100 each 6  . Insulin Glargine (LANTUS SOLOSTAR) 100 UNIT/ML Solostar Pen Inject 40 Units into the skin daily at 10 pm. 45 mL 3  . linagliptin (TRADJENTA) 5 MG TABS tablet Take 1 tablet (5 mg total) by mouth daily. 90 tablet 2  . memantine (NAMENDA) 5 MG tablet Take 1 tablet (5 mg total) by mouth 2 (two) times daily. 180 tablet 1  . metFORMIN (GLUCOPHAGE-XR) 750 MG 24 hr tablet Take 1 tablet (750 mg total) by mouth 2 (two) times daily. 60 tablet 3  . multivitamin (THERAGRAN) per tablet Take 1 tablet by mouth daily.      Marland Kitchen oxybutynin (DITROPAN XL) 15 MG 24 hr tablet Take 1 tablet (15 mg total) by mouth at bedtime. 30 tablet 2  . ramipril (ALTACE) 10 MG capsule Take 1 capsule (10 mg total) by mouth daily. 90 capsule 3  .  ranitidine (ZANTAC) 150 MG tablet Take 1 tablet (150 mg total) by mouth 2 (two) times daily. 180 tablet 3  . traMADol (ULTRAM) 50 MG tablet Take 1 tablet (50 mg total) by mouth every 8 (eight) hours as needed for moderate pain. 90 tablet 0   No current facility-administered medications for this visit.    Allergies  Allergen Reactions  . Codeine Other (See Comments)    unknown  . Sulfonamide Derivatives Other (See Comments)    unknown    Family History  Problem Relation Age of Onset  . Heart disease Mother   . Cancer Mother     ovarian  . Diabetes Neg Hx   . Stroke Neg Hx     Social History   Social History  . Marital Status: Married    Spouse Name: N/A  . Number of Children: N/A  . Years of Education: N/A   Occupational History  . Not on file.   Social History Main Topics  . Smoking status: Never Smoker   .  Smokeless tobacco: Not on file  . Alcohol Use: No  . Drug Use: No  . Sexual Activity:    Partners: Male    Birth Control/ Protection: Post-menopausal   Other Topics Concern  . Not on file   Social History Narrative     Constitutional: Denies fever, malaise, fatigue, headache or abrupt weight changes.  Respiratory: Denies difficulty breathing, shortness of breath, cough or sputum production.   Cardiovascular: Denies chest pain, chest tightness, palpitations or swelling in the hands or feet.  Musculoskeletal: Denies decrease in range of motion, difficulty with gait, muscle pain or joint pain and swelling.  Skin: Pt reports laceration to right forearm. Denies redness, rashes, lesions or ulcercations.   No other specific complaints in a complete review of systems (except as listed in HPI above).  Objective:   Physical Exam   BP 118/60 mmHg  Pulse 78  Temp(Src) 98.7 F (37.1 C) (Oral)  Wt   SpO2 97%  LMP 01/19/1996 Wt Readings from Last 3 Encounters:  04/21/15 196 lb (88.905 kg)  02/17/15 186 lb (84.369 kg)  02/06/15 190 lb (86.183 kg)    General: Appears her stated age, chronically ill appearing, in NAD. Skin: Laceration approximated with 6 sutures to right forearm. No drainage or infection noted. Neck: Neck supple, trachea midline. No masses, lumps or thyromegaly present.  Cardiovascular: Normal rate and rhythm. S1,S2 noted.  No murmur, rubs or gallops noted. No JVD. Trace BLE edema.   Pulmonary/Chest: Normal effort and positive vesicular breath sounds. No respiratory distress. No wheezes, rales or ronchi noted.  Abdomen: Soft and nontender.   Musculoskeletal: Gait slow. She should be walking with a cane, walker- currently working with PT. Neurological: Oriented to person. She is very confused. Psychiatric: Mood and affect flat.     BMET    Component Value Date/Time   NA 138 04/21/2015 1549   K 4.7 04/21/2015 1549   CL 102 04/21/2015 1549   CO2 27 04/21/2015 1549     GLUCOSE 276* 04/21/2015 1549   BUN 13 04/21/2015 1549   CREATININE 0.69 04/21/2015 1549   CALCIUM 9.8 04/21/2015 1549   GFRNONAA >60 02/09/2015 1657   GFRAA >60 02/09/2015 1657    Lipid Panel     Component Value Date/Time   CHOL 134 04/21/2015 1549   TRIG 118.0 04/21/2015 1549   HDL 50.10 04/21/2015 1549   CHOLHDL 3 04/21/2015 1549   VLDL 23.6 04/21/2015 1549  LDLCALC 60 04/21/2015 1549    CBC    Component Value Date/Time   WBC 13.4* 04/21/2015 1549   RBC 4.80 04/21/2015 1549   HGB 14.6 04/21/2015 1549   HCT 44.0 04/21/2015 1549   PLT 274.0 04/21/2015 1549   MCV 91.7 04/21/2015 1549   MCH 31.2 02/09/2015 1657   MCHC 33.1 04/21/2015 1549   RDW 13.9 04/21/2015 1549   LYMPHSABS 2.4 02/09/2015 1657   MONOABS 0.9 02/09/2015 1657   EOSABS 0.1 02/09/2015 1657   BASOSABS 0.0 02/09/2015 1657    Hgb A1C Lab Results  Component Value Date   HGBA1C 9.1* 04/21/2015        Assessment & Plan:   ER follow up for fall, laceration to right forearm and suture removal:  ER notes reviewed 8 sutures removed, laceration intacts After care instructions given  Encounter for form completion:  FL-112 form filled out per request  RTC in 5 months for annual exam

## 2015-06-18 ENCOUNTER — Ambulatory Visit: Payer: Medicare Other | Admitting: Gastroenterology

## 2015-07-15 ENCOUNTER — Ambulatory Visit: Payer: Medicare Other | Admitting: Neurology

## 2015-07-28 ENCOUNTER — Other Ambulatory Visit: Payer: Medicare Other

## 2015-08-01 ENCOUNTER — Other Ambulatory Visit: Payer: Self-pay

## 2015-08-01 MED ORDER — OXYBUTYNIN CHLORIDE ER 15 MG PO TB24: 15.0000 mg | ORAL_TABLET | Freq: Every day | ORAL | Status: DC

## 2015-08-18 ENCOUNTER — Telehealth: Payer: Self-pay | Admitting: Internal Medicine
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
## 2015-08-19 ENCOUNTER — Other Ambulatory Visit: Payer: Self-pay | Admitting: Internal Medicine

## 2015-08-19 DIAGNOSIS — H9190 Unspecified hearing loss, unspecified ear: Secondary | ICD-10-CM

## 2015-08-19 NOTE — Telephone Encounter (Signed)
I will place audiology referral. She can make appt to discuss if she wants but I am not getting in the middle of legal matters.

## 2015-10-13 ENCOUNTER — Ambulatory Visit: Payer: Medicare Other | Admitting: Neurology

## 2015-10-22 ENCOUNTER — Ambulatory Visit: Payer: Medicare Other | Admitting: Neurology
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
## 2016-02-02 IMAGING — CR DG CHEST 1V PORT
1 series · 1 of 1 positions shown · non-contrast
Comparison: 03/03/2014

CLINICAL DATA: Right pneumothorax.  Right rib fractures.

EXAM:
PORTABLE CHEST - 1 VIEW

[AP]
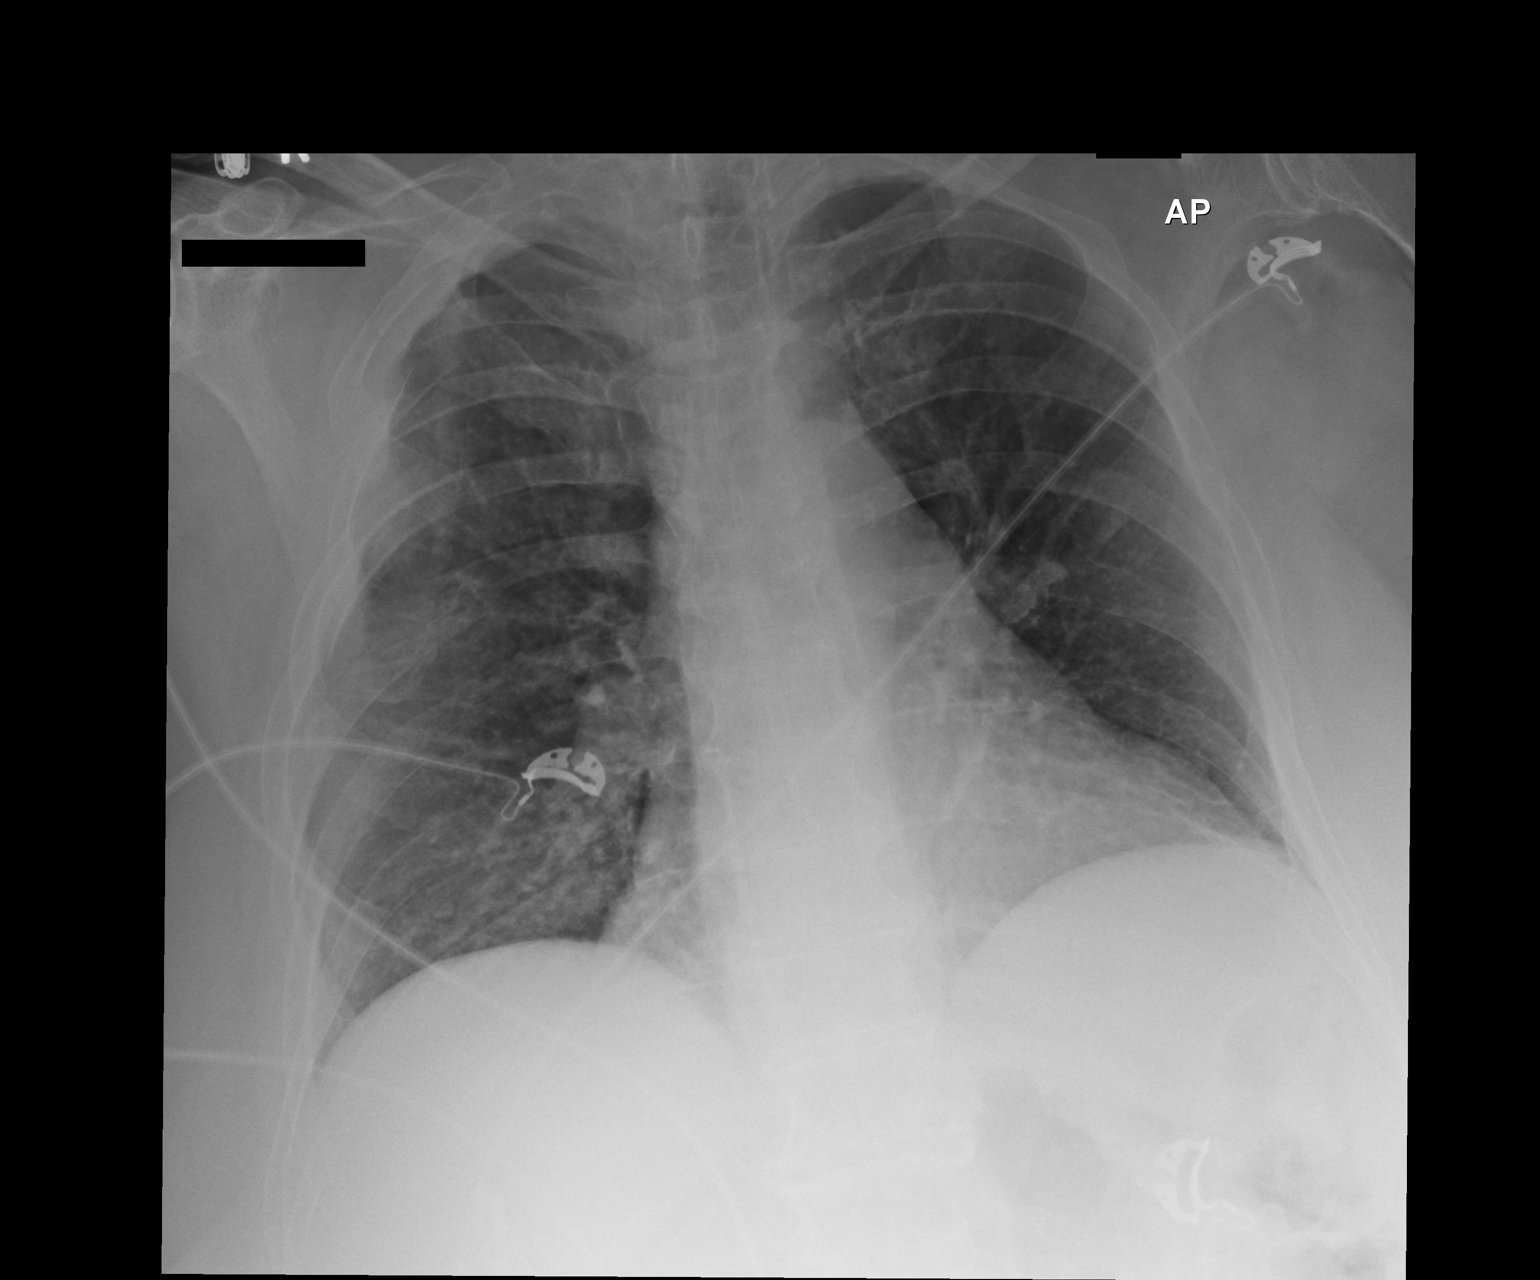

[1 of 1 positions shown; findings below may reference images not displayed]

FINDINGS: There is small right apical pneumothorax, unchanged. Multiple
displaced right posterior lateral rib fractures. Lungs are otherwise
clear. Heart size and vascularity are normal.
IMPRESSION: No significant change in the small right apical pneumothorax.
Multiple displaced right rib fractures.

## 2016-02-11 ENCOUNTER — Telehealth: Payer: Self-pay

## 2016-02-11 NOTE — Telephone Encounter (Signed)
I called [facility] in reference to the fax we received for orders and they told me that they do not have MD on staff, one visits but not there---please advise--order placed in your box

## 2016-02-11 NOTE — Telephone Encounter (Signed)
So if that is the case, then I need to be seeing her on a regular schedule. Have her make a follow up appt with me

## 2016-02-12 NOTE — Telephone Encounter (Signed)
Faxed back with a note stating pt needs a f/u appt

## 2020-05-05 ENCOUNTER — Emergency Department (HOSPITAL_COMMUNITY): Payer: Medicare PPO

## 2020-05-05 ENCOUNTER — Other Ambulatory Visit: Payer: Self-pay

## 2020-05-05 ENCOUNTER — Inpatient Hospital Stay (HOSPITAL_COMMUNITY): Payer: Medicare PPO

## 2020-05-05 ENCOUNTER — Encounter (HOSPITAL_COMMUNITY): Payer: Self-pay | Admitting: Emergency Medicine

## 2020-05-05 ENCOUNTER — Inpatient Hospital Stay (HOSPITAL_COMMUNITY)
Admission: EM | Admit: 2020-05-05 | Discharge: 2020-05-09 | DRG: 493 | Disposition: A | Discharge status: Skilled Nursing Facility | Payer: Medicare PPO | Source: Skilled Nursing Facility | Attending: Internal Medicine | Admitting: Internal Medicine

## 2020-05-05 DIAGNOSIS — H269 Unspecified cataract: Secondary | ICD-10-CM | POA: Diagnosis present

## 2020-05-05 DIAGNOSIS — Z8249 Family history of ischemic heart disease and other diseases of the circulatory system: Secondary | ICD-10-CM | POA: Diagnosis not present

## 2020-05-05 DIAGNOSIS — Z79899 Other long term (current) drug therapy: Secondary | ICD-10-CM

## 2020-05-05 DIAGNOSIS — W19XXXA Unspecified fall, initial encounter: Secondary | ICD-10-CM | POA: Diagnosis present

## 2020-05-05 DIAGNOSIS — Z7983 Long term (current) use of bisphosphonates: Secondary | ICD-10-CM | POA: Diagnosis not present

## 2020-05-05 DIAGNOSIS — K219 Gastro-esophageal reflux disease without esophagitis: Secondary | ICD-10-CM | POA: Diagnosis present

## 2020-05-05 DIAGNOSIS — F419 Anxiety disorder, unspecified: Secondary | ICD-10-CM | POA: Diagnosis present

## 2020-05-05 DIAGNOSIS — Z20822 Contact with and (suspected) exposure to covid-19: Secondary | ICD-10-CM | POA: Diagnosis present

## 2020-05-05 DIAGNOSIS — Y92129 Unspecified place in nursing home as the place of occurrence of the external cause: Secondary | ICD-10-CM

## 2020-05-05 DIAGNOSIS — Z794 Long term (current) use of insulin: Secondary | ICD-10-CM

## 2020-05-05 DIAGNOSIS — M8580 Other specified disorders of bone density and structure, unspecified site: Secondary | ICD-10-CM | POA: Diagnosis present

## 2020-05-05 DIAGNOSIS — S82842A Displaced bimalleolar fracture of left lower leg, initial encounter for closed fracture: Secondary | ICD-10-CM | POA: Diagnosis present

## 2020-05-05 DIAGNOSIS — Z7984 Long term (current) use of oral hypoglycemic drugs: Secondary | ICD-10-CM | POA: Diagnosis not present

## 2020-05-05 DIAGNOSIS — I350 Nonrheumatic aortic (valve) stenosis: Secondary | ICD-10-CM | POA: Diagnosis present

## 2020-05-05 DIAGNOSIS — Z01818 Encounter for other preprocedural examination: Secondary | ICD-10-CM | POA: Diagnosis not present

## 2020-05-05 DIAGNOSIS — E119 Type 2 diabetes mellitus without complications: Secondary | ICD-10-CM | POA: Diagnosis present

## 2020-05-05 DIAGNOSIS — I10 Essential (primary) hypertension: Secondary | ICD-10-CM | POA: Diagnosis present

## 2020-05-05 DIAGNOSIS — N390 Urinary tract infection, site not specified: Secondary | ICD-10-CM | POA: Diagnosis present

## 2020-05-05 DIAGNOSIS — J449 Chronic obstructive pulmonary disease, unspecified: Secondary | ICD-10-CM | POA: Diagnosis present

## 2020-05-05 DIAGNOSIS — D509 Iron deficiency anemia, unspecified: Secondary | ICD-10-CM | POA: Diagnosis present

## 2020-05-05 DIAGNOSIS — Z885 Allergy status to narcotic agent status: Secondary | ICD-10-CM | POA: Diagnosis not present

## 2020-05-05 DIAGNOSIS — I050 Rheumatic mitral stenosis: Secondary | ICD-10-CM | POA: Diagnosis present

## 2020-05-05 DIAGNOSIS — F0390 Unspecified dementia without behavioral disturbance: Secondary | ICD-10-CM | POA: Diagnosis present

## 2020-05-05 DIAGNOSIS — Z882 Allergy status to sulfonamides status: Secondary | ICD-10-CM | POA: Diagnosis not present

## 2020-05-05 DIAGNOSIS — Z7901 Long term (current) use of anticoagulants: Secondary | ICD-10-CM | POA: Diagnosis not present

## 2020-05-05 DIAGNOSIS — E785 Hyperlipidemia, unspecified: Secondary | ICD-10-CM | POA: Diagnosis present

## 2020-05-05 DIAGNOSIS — S82892A Other fracture of left lower leg, initial encounter for closed fracture: Principal | ICD-10-CM

## 2020-05-05 DIAGNOSIS — R55 Syncope and collapse: Secondary | ICD-10-CM

## 2020-05-05 DIAGNOSIS — Z809 Family history of malignant neoplasm, unspecified: Secondary | ICD-10-CM | POA: Diagnosis not present

## 2020-05-05 DIAGNOSIS — F32A Depression, unspecified: Secondary | ICD-10-CM | POA: Diagnosis present

## 2020-05-05 DIAGNOSIS — L89311 Pressure ulcer of right buttock, stage 1: Secondary | ICD-10-CM | POA: Diagnosis present

## 2020-05-05 DIAGNOSIS — S82891A Other fracture of right lower leg, initial encounter for closed fracture: Secondary | ICD-10-CM | POA: Diagnosis present

## 2020-05-05 DIAGNOSIS — I480 Paroxysmal atrial fibrillation: Secondary | ICD-10-CM | POA: Diagnosis present

## 2020-05-05 DIAGNOSIS — R001 Bradycardia, unspecified: Secondary | ICD-10-CM | POA: Diagnosis not present

## 2020-05-05 DIAGNOSIS — B9620 Unspecified Escherichia coli [E. coli] as the cause of diseases classified elsewhere: Secondary | ICD-10-CM | POA: Diagnosis present

## 2020-05-05 DIAGNOSIS — L8990 Pressure ulcer of unspecified site, unspecified stage: Secondary | ICD-10-CM | POA: Insufficient documentation

## 2020-05-05 LAB — CBC WITH DIFFERENTIAL/PLATELET
Abs Immature Granulocytes: 0.05 10*3/uL (ref 0.00–0.07)
Basophils Absolute: 0 10*3/uL (ref 0.0–0.1)
Basophils Relative: 0 %
Eosinophils Absolute: 0 10*3/uL (ref 0.0–0.5)
Eosinophils Relative: 0 %
HCT: 24.1 % — ABNORMAL LOW (ref 36.0–46.0)
Hemoglobin: 6.6 g/dL — CL (ref 12.0–15.0)
Immature Granulocytes: 0 %
Lymphocytes Relative: 7 %
Lymphs Abs: 0.9 10*3/uL (ref 0.7–4.0)
MCH: 18.6 pg — ABNORMAL LOW (ref 26.0–34.0)
MCHC: 27.4 g/dL — ABNORMAL LOW (ref 30.0–36.0)
MCV: 67.9 fL — ABNORMAL LOW (ref 80.0–100.0)
Monocytes Absolute: 0.6 10*3/uL (ref 0.1–1.0)
Monocytes Relative: 4 %
Neutro Abs: 11.6 10*3/uL — ABNORMAL HIGH (ref 1.7–7.7)
Neutrophils Relative %: 89 %
Platelets: 305 10*3/uL (ref 150–400)
RBC: 3.55 MIL/uL — ABNORMAL LOW (ref 3.87–5.11)
RDW: 19.3 % — ABNORMAL HIGH (ref 11.5–15.5)
WBC: 13.1 10*3/uL — ABNORMAL HIGH (ref 4.0–10.5)
nRBC: 0 % (ref 0.0–0.2)

## 2020-05-05 LAB — URINALYSIS, ROUTINE W REFLEX MICROSCOPIC
Bilirubin Urine: NEGATIVE
Glucose, UA: 150 mg/dL — AB
Ketones, ur: 20 mg/dL — AB
Nitrite: POSITIVE — AB
Protein, ur: NEGATIVE mg/dL
Specific Gravity, Urine: 1.015 (ref 1.005–1.030)
WBC, UA: >50 WBC/hpf — ABNORMAL HIGH (ref 0–5)
pH: 6 (ref 5.0–8.0)

## 2020-05-05 LAB — RESP PANEL BY RT-PCR (FLU A&B, COVID) ARPGX2
Influenza A by PCR: NEGATIVE
Influenza B by PCR: NEGATIVE
SARS Coronavirus 2 by RT PCR: NEGATIVE

## 2020-05-05 LAB — ECHOCARDIOGRAM COMPLETE
AR max vel: 1.42 cm2
AV Area VTI: 1.29 cm2
AV Area mean vel: 1.3 cm2
AV Mean grad: 42.3 mmHg
AV Peak grad: 69.6 mmHg
Ao pk vel: 4.17 m/s
Area-P 1/2: 4.39 cm2
Calc EF: 59.1 %
MV VTI: 1.89 cm2
S' Lateral: 3 cm
Single Plane A2C EF: 49.3 %
Single Plane A4C EF: 68.7 %

## 2020-05-05 LAB — BASIC METABOLIC PANEL WITH GFR
Anion gap: 10 (ref 5–15)
BUN: 15 mg/dL (ref 8–23)
CO2: 22 mmol/L (ref 22–32)
Calcium: 8.9 mg/dL (ref 8.9–10.3)
Chloride: 107 mmol/L (ref 98–111)
Creatinine, Ser: 0.57 mg/dL (ref 0.44–1.00)
GFR, Estimated: >60 mL/min
Glucose, Bld: 198 mg/dL — ABNORMAL HIGH (ref 70–99)
Potassium: 4 mmol/L (ref 3.5–5.1)
Sodium: 139 mmol/L (ref 135–145)

## 2020-05-05 LAB — GLUCOSE, CAPILLARY
Glucose-Capillary: 162 mg/dL — ABNORMAL HIGH (ref 70–99)
Glucose-Capillary: 151 mg/dL — ABNORMAL HIGH (ref 70–99)
Glucose-Capillary: 130 mg/dL — ABNORMAL HIGH (ref 70–99)

## 2020-05-05 LAB — SURGICAL PCR SCREEN
MRSA, PCR: NEGATIVE
Staphylococcus aureus: POSITIVE — AB

## 2020-05-05 LAB — HEMOGLOBIN AND HEMATOCRIT, BLOOD
HCT: 27.7 % — ABNORMAL LOW (ref 36.0–46.0)
Hemoglobin: 8.2 g/dL — ABNORMAL LOW (ref 12.0–15.0)

## 2020-05-05 LAB — IRON AND TIBC
Iron: 7 ug/dL — ABNORMAL LOW (ref 28–170)
Saturation Ratios: 2 % — ABNORMAL LOW (ref 10.4–31.8)
TIBC: 443 ug/dL (ref 250–450)
UIBC: 436 ug/dL

## 2020-05-05 LAB — VITAMIN B12: Vitamin B-12: 657 pg/mL (ref 180–914)

## 2020-05-05 LAB — PREPARE RBC (CROSSMATCH)

## 2020-05-05 LAB — FOLATE: Folate: 43.5 ng/mL (ref >5.9)

## 2020-05-05 LAB — HEMOGLOBIN A1C
Hgb A1c MFr Bld: 6.5 % — ABNORMAL HIGH (ref 4.8–5.6)
Mean Plasma Glucose: 139.85 mg/dL

## 2020-05-05 LAB — CBG MONITORING, ED: Glucose-Capillary: 212 mg/dL — ABNORMAL HIGH (ref 70–99)

## 2020-05-05 LAB — MRSA PCR SCREENING: MRSA by PCR: NEGATIVE

## 2020-05-05 LAB — ABO/RH: ABO/RH(D): A POS

## 2020-05-05 MED ORDER — MUPIROCIN 2 % EX OINT
1.0000 "application " | TOPICAL_OINTMENT | Freq: Two times a day (BID) | CUTANEOUS | Status: DC
  Administered 2020-05-06 – 2020-05-08 (×7): 1 via NASAL
  Filled 2020-05-05: qty 22

## 2020-05-05 MED ORDER — CEFAZOLIN SODIUM-DEXTROSE 2-4 GM/100ML-% IV SOLN
2.0000 g | INTRAVENOUS | Status: AC
Start: 2020-05-06 — End: 2020-05-06
  Administered 2020-05-06: 2 g via INTRAVENOUS
  Filled 2020-05-05: qty 100

## 2020-05-05 MED ORDER — SODIUM CHLORIDE 0.9% IV SOLUTION: Freq: Once | INTRAVENOUS | Status: AC

## 2020-05-05 MED ORDER — ATORVASTATIN CALCIUM 10 MG PO TABS
10.0000 mg | ORAL_TABLET | Freq: Every day | ORAL | Status: DC
  Administered 2020-05-05 – 2020-05-08 (×4): 10 mg via ORAL
  Filled 2020-05-05 (×4): qty 1

## 2020-05-05 MED ORDER — INSULIN ASPART 100 UNIT/ML ~~LOC~~ SOLN
0.0000 [IU] | Freq: Three times a day (TID) | SUBCUTANEOUS | Status: DC
  Administered 2020-05-05 – 2020-05-07 (×5): 2 [IU] via SUBCUTANEOUS
  Administered 2020-05-07: 5 [IU] via SUBCUTANEOUS
  Administered 2020-05-08: 3 [IU] via SUBCUTANEOUS
  Administered 2020-05-08: 5 [IU] via SUBCUTANEOUS

## 2020-05-05 MED ORDER — CHLORHEXIDINE GLUCONATE CLOTH 2 % EX PADS: 6.0000 | MEDICATED_PAD | Freq: Every day | CUTANEOUS | Status: DC

## 2020-05-05 MED ORDER — ONDANSETRON HCL 4 MG PO TABS: 4.0000 mg | ORAL_TABLET | Freq: Four times a day (QID) | ORAL | Status: DC | PRN

## 2020-05-05 MED ORDER — POVIDONE-IODINE 10 % EX SWAB
2.0000 "application " | Freq: Once | CUTANEOUS | Status: AC
  Administered 2020-05-06: 2 via TOPICAL

## 2020-05-05 MED ORDER — FENTANYL CITRATE (PF) 100 MCG/2ML IJ SOLN
50.0000 ug | Freq: Once | INTRAMUSCULAR | Status: AC
  Administered 2020-05-05: 50 ug via INTRAVENOUS
  Filled 2020-05-05: qty 2

## 2020-05-05 MED ORDER — ACETAMINOPHEN 500 MG PO TABS
1000.0000 mg | ORAL_TABLET | ORAL | Status: AC
Start: 2020-05-06 — End: 2020-05-06
  Administered 2020-05-06: 1000 mg via ORAL
  Filled 2020-05-05: qty 2

## 2020-05-05 MED ORDER — SODIUM CHLORIDE 0.9 % IV SOLN: INTRAVENOUS | Status: DC

## 2020-05-05 MED ORDER — CHLORHEXIDINE GLUCONATE 4 % EX LIQD
60.0000 mL | Freq: Once | CUTANEOUS | Status: AC
  Administered 2020-05-06: 4 via TOPICAL
  Filled 2020-05-05: qty 60

## 2020-05-05 MED ORDER — ACETAMINOPHEN 325 MG PO TABS
650.0000 mg | ORAL_TABLET | Freq: Four times a day (QID) | ORAL | Status: DC | PRN
  Administered 2020-05-05 – 2020-05-08 (×3): 650 mg via ORAL
  Filled 2020-05-05 (×3): qty 2

## 2020-05-05 MED ORDER — HYDROCODONE-ACETAMINOPHEN 5-325 MG PO TABS: 1.0000 | ORAL_TABLET | Freq: Four times a day (QID) | ORAL | Status: DC | PRN

## 2020-05-05 MED ORDER — ACETAMINOPHEN 650 MG RE SUPP: 650.0000 mg | Freq: Four times a day (QID) | RECTAL | Status: DC | PRN

## 2020-05-05 MED ORDER — SODIUM CHLORIDE 0.9 % IV SOLN: Freq: Once | INTRAVENOUS | Status: AC

## 2020-05-05 MED ORDER — MEMANTINE HCL 10 MG PO TABS
10.0000 mg | ORAL_TABLET | Freq: Two times a day (BID) | ORAL | Status: DC
  Administered 2020-05-05 – 2020-05-08 (×7): 10 mg via ORAL
  Filled 2020-05-05 (×7): qty 1

## 2020-05-05 MED ORDER — ONDANSETRON HCL 4 MG/2ML IJ SOLN: 4.0000 mg | Freq: Four times a day (QID) | INTRAMUSCULAR | Status: DC | PRN

## 2020-05-05 MED ORDER — RAMIPRIL 10 MG PO CAPS
10.0000 mg | ORAL_CAPSULE | Freq: Every day | ORAL | Status: DC
  Administered 2020-05-07 – 2020-05-08 (×2): 10 mg via ORAL
  Filled 2020-05-05 (×3): qty 1

## 2020-05-05 MED ORDER — SODIUM CHLORIDE 0.9% FLUSH
3.0000 mL | Freq: Two times a day (BID) | INTRAVENOUS | Status: DC
  Administered 2020-05-05 – 2020-05-07 (×5): 3 mL via INTRAVENOUS

## 2020-05-05 MED ORDER — SERTRALINE HCL 50 MG PO TABS
50.0000 mg | ORAL_TABLET | Freq: Every day | ORAL | Status: DC
  Administered 2020-05-06 – 2020-05-08 (×3): 50 mg via ORAL
  Filled 2020-05-05 (×3): qty 1

## 2020-05-05 MED ORDER — DONEPEZIL HCL 10 MG PO TABS
10.0000 mg | ORAL_TABLET | Freq: Every day | ORAL | Status: DC
  Administered 2020-05-05 – 2020-05-08 (×4): 10 mg via ORAL
  Filled 2020-05-05 (×4): qty 1

## 2020-05-05 NOTE — ED Provider Notes (Signed)
[facility] DEPT Provider Note   CSN: [id number] Arrival date & time: 05/05/20  0240     History Chief Complaint  Patient presents with  . Fall    Ankl    . Ankle Pain    Denise [name] is a 80 y.o. female.  80 year old female presents after unwitnessed fall from nursing home.  Has a history of dementia and is on Xarelto.  Has deformity to left ankle.  No other injuries were noted.  Transported by EMS after attempted splinting of her left ankle        Past Medical History:  Diagnosis Date  . Arthritis   . Asthma   . Asthma   . Carpal tunnel syndrome 09/2006   bilateral   . Cataracts, bilateral 12/2010  . COPD (chronic obstructive pulmonary disease) ([place])   . Diabetes mellitus   . GERD (gastroesophageal reflux disease)   . Hx of atrial fibrillation, no current medication   . Hyperlipidemia   . Hypertension   . Osteopenia   . Plantar fasciitis 2010    Patient Active Problem List   Diagnosis Date Noted  . OAB (overactive bladder) 10/25/2014  . Paroxysmal a-fib ([place]) 10/25/2014  . Obesity (BMI 30-39.9) 06/08/2013  . Dementia ([place]) 06/08/2013  . DM type 2 (diabetes mellitus, type 2) ([place]) 01/05/2010  . Osteoporosis 04/08/2008  . CARPAL TUNNEL SYNDROME, RIGHT 08/22/2006  . HLD (hyperlipidemia) 08/19/2006  . Essential hypertension 08/19/2006  . Mild intermittent asthma 08/09/2006  . GERD 08/09/2006  . Osteoarthritis 08/09/2006    Past Surgical History:  Procedure Laterality Date  . BREAST BIOPSY Left 2001   . cardiac ablation in 2001 for a fib    . CARPAL TUNNEL RELEASE Bilateral 09/2006  . colopnoscopy  05/17/2001  . oophorectomy cyst    . TONSILLECTOMY       OB History    Gravida  1   Para      Term      Preterm      AB  1   Living        SAB  1   IAB      Ectopic      Multiple      Live Births              Family History  Problem Relation Age of Onset  . Heart disease Mother   . Cancer  Mother        ovarian  . Diabetes Neg Hx   . Stroke Neg Hx     Social History   Tobacco Use  . Smoking status: Never Smoker  . Smokeless tobacco: Never Used  Substance Use Topics  . Alcohol use: No  . Drug use: No    Home Medications Prior to Admission medications   Medication Sig Start Date End Date Taking? Authorizing Provider  albuterol (PROVENTIL HFA;VENTOLIN HFA) 108 (90 BASE) MCG/ACT inhaler Inhale 2 puffs into the lungs every 6 (six) hours as needed. 10/28/14   Jearld Fenton, NP  alendronate (FOSAMAX) 70 MG tablet TAKE 1 TABLET BY MOUTH ONCE A WEEK WITH FULL GLASS OF WATER ON EMPTY STOMACH 05/07/15   Jearld Fenton, NP  atorvastatin (LIPITOR) 20 MG tablet Take 1 tablet (20 mg total) by mouth daily. 10/28/14   Jearld Fenton, NP  BD PEN NEEDLE NANO U/F 32G X 4 MM MISC USE DAILY AS DIRECTED 12/31/14   Jearld Fenton, NP  Blood Glucose Monitoring Suppl (  ACCU-CHEK NANO SMARTVIEW) W/DEVICE KIT by Does not apply route.    [provider]  Cholecalciferol (VITAMIN D) 2000 UNITS CAPS Take 1 capsule by mouth daily.    [provider]  donepezil (ARICEPT) 23 MG TABS tablet TAKE 1 TABLET BY MOUTH ATBEDTIME. 12/23/14   Jearld Fenton, NP  glipiZIDE (GLUCOTROL) 10 MG tablet Take 1 tablet (10 mg total) by mouth 2 (two) times daily before a meal. 04/22/15   Baity, Coralie Keens, NP  glucose blood (ACCU-CHEK SMARTVIEW) test strip 1 each by Other route 2 (two) times daily. 11/25/14   Jearld Fenton, NP  Insulin Glargine (LANTUS SOLOSTAR) 100 UNIT/ML Solostar Pen Inject 40 Units into the skin daily at 10 pm. 10/28/14   Jearld Fenton, NP  linagliptin (TRADJENTA) 5 MG TABS tablet Take 1 tablet (5 mg total) by mouth daily. 10/28/14   Jearld Fenton, NP  memantine (NAMENDA) 5 MG tablet Take 1 tablet (5 mg total) by mouth 2 (two) times daily. 02/06/15   Jearld Fenton, NP  metFORMIN (GLUCOPHAGE-XR) 750 MG 24 hr tablet Take 1 tablet (750 mg total) by mouth 2 (two) times daily. 04/22/15    Jearld Fenton, NP  multivitamin [facility]) per tablet Take 1 tablet by mouth daily.      [provider]  oxybutynin (DITROPAN XL) 15 MG 24 hr tablet Take 1 tablet (15 mg total) by mouth at bedtime. 08/01/15   Jearld Fenton, NP  ramipril (ALTACE) 10 MG capsule Take 1 capsule (10 mg total) by mouth daily. 10/28/14   Jearld Fenton, NP  ranitidine (ZANTAC) 150 MG tablet Take 1 tablet (150 mg total) by mouth 2 (two) times daily. 10/28/14   Jearld Fenton, NP  traMADol (ULTRAM) 50 MG tablet Take 1 tablet (50 mg total) by mouth every 8 (eight) hours as needed for moderate pain. 04/10/14   Meredith Staggers, MD    Allergies    Codeine and Sulfonamide derivatives  Review of Systems   Review of Systems  Unable to perform ROS: Dementia    Physical Exam Updated Vital Signs BP (!) 179/74 (BP Location: Left Arm)   Pulse 88   Temp (!) 97.5 F (36.4 C) (Oral)   Resp 17   LMP 01/19/1996   SpO2 100%   Physical Exam Vitals and nursing note reviewed.  Constitutional:      General: She is not in acute distress.    Appearance: Normal appearance. She is well-developed. She is not toxic-appearing.  HENT:     Head: Normocephalic and atraumatic.  Eyes:     General: Lids are normal.     Conjunctiva/sclera: Conjunctivae normal.     Pupils: Pupils are equal, round, and reactive to light.  Neck:     Thyroid: No thyroid mass.     Trachea: No tracheal deviation.  Cardiovascular:     Rate and Rhythm: Normal rate and regular rhythm.     Heart sounds: Normal heart sounds. No murmur heard. No gallop.   Pulmonary:     Effort: Pulmonary effort is normal. No respiratory distress.     Breath sounds: Normal breath sounds. No stridor. No decreased breath sounds, wheezing, rhonchi or rales.  Abdominal:     General: Bowel sounds are normal. There is no distension.     Palpations: Abdomen is soft.     Tenderness: There is no abdominal tenderness. There is no rebound.  Musculoskeletal:         General:  No tenderness. Normal range of motion.     Cervical back: Normal range of motion and neck supple.       Legs:     Comments: No shortening or rotation noted to left lower extremity.  Skin:    General: Skin is warm and dry.     Findings: No abrasion or rash.  Neurological:     Mental Status: She is alert. She is disoriented.     GCS: GCS eye subscore is 4. GCS verbal subscore is 5. GCS motor subscore is 6.     Cranial Nerves: No cranial nerve deficit.     Sensory: No sensory deficit.     Comments: Moves all 4 extremities at this time.  Psychiatric:        Attention and Perception: She is inattentive.     ED Results / Procedures / Treatments   Labs (all labs ordered are listed, but only abnormal results are displayed) Labs Reviewed - No data to display  EKG None  Radiology No results found.  Procedures Procedures   Medications Ordered in ED Medications  0.9 %  sodium chloride infusion (has no administration in time range)    ED Course  I have reviewed the triage vital signs and the nursing notes.  Pertinent labs & imaging results that were available during my care of the patient were reviewed by me and considered in my medical decision making (see chart for details).    MDM Rules/Calculators/A&P                         Fentanyl given for pain Patient with bimalleolar ankle fracture on the left and this was splinted by me.  Discussed with Dr. Mardelle Matte from orthopedics who states he will likely perform surgery on the patient tomorrow and request medicine admission attempted to contact patient's relative listed in the EMR but no voicemail was available Final Clinical Impression(s) / ED Diagnoses Final diagnoses:  None    Rx / DC Orders ED Discharge Orders    None       Lacretia Leigh, MD 05/05/20 [phone]

## 2020-05-05 NOTE — Progress Notes (Signed)
Orthopedic Tech Progress Note Patient Details:  Denise [name] 28-Dec-1940 [id number]  Ortho Devices Type of Ortho Device: Ace wrap,Stirrup splint,Short leg splint Ortho Device/Splint Location: reduction Ortho Device/Splint Interventions: Application   Post Interventions Patient Tolerated: Well Instructions Provided: Care of device   Saul Fordyce 05/05/2020, 9:36 AM

## 2020-05-05 NOTE — Progress Notes (Signed)
  Echocardiogram 2D Echocardiogram has been performed.  Janalyn Harder 05/05/2020, 3:45 PM

## 2020-05-05 NOTE — Consult Note (Signed)
ORTHOPAEDIC CONSULTATION  REQUESTING PHYSICIAN: Teddy Spike, DO  Chief Complaint: Left ankle pain  HPI: Denise [name] is a 80 y.o. female with history of dementia, COPD, diabetes type 2, GERD, A. fib on Xarelto who complains of left ankle pain after an unwitnessed fall at nursing home.  She noted left ankle pain and was taken to the ED via EMS. Patient is poor historian due to dementia and history is obtained from chart review and guardians.  Patient states left ankle pain is moderate, pain is worse with movement and palpation, no relieving factors. Ambulates with a walker at baseline, was participating in PT prior to this event due to instability with walking.    Past Medical History:  Diagnosis Date  . Arthritis   . Asthma   . Asthma   . Carpal tunnel syndrome 09/2006   bilateral   . Cataracts, bilateral 12/2010  . COPD (chronic obstructive pulmonary disease) ([facility])   . Diabetes mellitus   . GERD (gastroesophageal reflux disease)   . Hx of atrial fibrillation, no current medication   . Hyperlipidemia   . Hypertension   . Osteopenia   . Plantar fasciitis 2010   Past Surgical History:  Procedure Laterality Date  . BREAST BIOPSY Left 2001   . cardiac ablation in 2001 for a fib    . CARPAL TUNNEL RELEASE Bilateral 09/2006  . colopnoscopy  05/17/2001  . oophorectomy cyst    . TONSILLECTOMY     Social History   Socioeconomic History  . Marital status: Married    Spouse name: Not on file  . Number of children: Not on file  . Years of education: Not on file  . Highest education level: Not on file  Occupational History  . Not on file  Tobacco Use  . Smoking status: Never Smoker  . Smokeless tobacco: Never Used  Substance and Sexual Activity  . Alcohol use: No  . Drug use: No  . Sexual activity: Yes    Partners: Male    Birth control/protection: Post-menopausal  Other Topics Concern  . Not on file  Social History Narrative  . Not on file   Social Determinants  of Health   Financial Resource Strain: Not on file  Food Insecurity: Not on file  Transportation Needs: Not on file  Physical Activity: Not on file  Stress: Not on file  Social Connections: Not on file   Family History  Problem Relation Age of Onset  . Heart disease Mother   . Cancer Mother        ovarian  . Diabetes Neg Hx   . Stroke Neg Hx    Allergies  Allergen Reactions  . Codeine Other (See Comments)    unknown  . Sulfonamide Derivatives Other (See Comments)    unknown     Positive ROS: All other systems have been reviewed and were otherwise negative with the exception of those mentioned in the HPI and as above.  Physical Exam: General: Alert, sitting up in bed, no acute distress. Cardiovascular: No pedal edema Respiratory: No cyanosis, no use of accessory musculature GI: No organomegaly, abdomen is soft and non-tender Skin: Left ankle splint in place unable to visualize skin. Neurologic: Patient endorses sensation is intact to all toes of left foot Psychiatric: Patient with mittens on bilateral hands.  Oriented to person only. Lymphatic: No axillary or cervical lymphadenopathy  MUSCULOSKELETAL: LLE - Short leg splint in place.  Patient able to wiggle all toes of left foot.  Endorses sensation to all toes of left foot.  Capillary refill intact.  No tenderness to palpation of left knee or edema.  Imaging: 3 views of left ankle shows displaced bimalleolar fractures of medial and lateral malleoli.  4 views of left knee show degenerative changes but no fracture  Assessment/Plan: Left bimalleolar ankle fracture in patient with A. fib on Xarelto, dementia, symptomatic anemia - spoke with Co-guardian Ennis Forts phone #:[phone] (alternative number is [phone] cell #) -Left ankle ORIF tentatively scheduled for tomorrow morning, however patient was found to be anemic with hemoglobin of 6.6 and transfusion was ordered, pending results of echocardiogram, currently  holding Xarelto -We will make patient n.p.o. after midnight and reassess if optimized for surgery in the morning - Nonweightbearing left lower extremity, keep splint in place, can prop on pillows for comfort   Armida Sans, PA-C  05/05/2020 5:04 PM

## 2020-05-05 NOTE — ED Notes (Signed)
Patient attached to external female catheter 

## 2020-05-05 NOTE — ED Triage Notes (Addendum)
Patient here via EMS from [facility] reporting unwitnessed fall. Left ankle deformity noted. Left pedal pulse palpable. On blood thinner.

## 2020-05-05 NOTE — Plan of Care (Signed)

## 2020-05-05 NOTE — Progress Notes (Signed)
Left bimal fx, complex history, plan for admission to [facility], medical optimization, surgery probably tomorrow.  Full consult to follow.   Eulas Post, MD

## 2020-05-05 NOTE — ED Notes (Signed)
Bed alarm on Yellow socks Yellow fall risk armband Yellow fall risk sign outside of room [place]

## 2020-05-05 NOTE — H&P (Signed)
History and Physical    Denise [name] FHQ:[id number] DOB: 02-Dec-1940 DOA: 05/05/2020  PCP: Jearld Fenton, NP  Patient coming from: [place]  Chief Complaint: Unwitnessed fall.  HPI: Denise [name] is a 80 y.o. female with medical history significant of dementia, afib on anticoagulation, COPD, DM2. Presenting with leg pain after unwitnessed fall. Patient is poor historian d/t dementia. Hx from chart review and call to facility. Apparently during 3rd shift last night, it was noted that the patient had fallen and was having difficulty getting up. EMS was called for transfer to ED. There are no reports of recent fevers, mentation changes, c/o of dysuria. There are no other reported aggravating or alleviating factors.     ED Course: She was found to have a left ankle fracture. Orthopedics was consulted. [facility] was called for admission.   Review of Systems:  Unable to obtained d/t mentation.   PMHx Past Medical History:  Diagnosis Date  . Arthritis   . Asthma   . Asthma   . Carpal tunnel syndrome 09/2006   bilateral   . Cataracts, bilateral 12/2010  . COPD (chronic obstructive pulmonary disease) ([place])   . Diabetes mellitus   . GERD (gastroesophageal reflux disease)   . Hx of atrial fibrillation, no current medication   . Hyperlipidemia   . Hypertension   . Osteopenia   . Plantar fasciitis 2010    PSHx Past Surgical History:  Procedure Laterality Date  . BREAST BIOPSY Left 2001   . cardiac ablation in 2001 for a fib    . CARPAL TUNNEL RELEASE Bilateral 09/2006  . colopnoscopy  05/17/2001  . oophorectomy cyst    . TONSILLECTOMY      SocHx  reports that she has never smoked. She has never used smokeless tobacco. She reports that she does not drink alcohol and does not use drugs.  Allergies  Allergen Reactions  . Codeine Other (See Comments)    unknown  . Sulfonamide Derivatives Other (See Comments)    unknown    FamHx Family History  Problem Relation  Age of Onset  . Heart disease Mother   . Cancer Mother        ovarian  . Diabetes Neg Hx   . Stroke Neg Hx     Prior to Admission medications   Medication Sig Start Date End Date Taking? Authorizing Provider  albuterol (PROVENTIL HFA;VENTOLIN HFA) 108 (90 BASE) MCG/ACT inhaler Inhale 2 puffs into the lungs every 6 (six) hours as needed. 10/28/14   Jearld Fenton, NP  alendronate (FOSAMAX) 70 MG tablet TAKE 1 TABLET BY MOUTH ONCE A WEEK WITH FULL GLASS OF WATER ON EMPTY STOMACH 05/07/15   Jearld Fenton, NP  atorvastatin (LIPITOR) 20 MG tablet Take 1 tablet (20 mg total) by mouth daily. 10/28/14   Jearld Fenton, NP  BD PEN NEEDLE NANO U/F 32G X 4 MM MISC USE DAILY AS DIRECTED 12/31/14   Jearld Fenton, NP  Blood Glucose Monitoring Suppl (ACCU-CHEK NANO SMARTVIEW) W/DEVICE KIT by Does not apply route.    [provider]  Cholecalciferol (VITAMIN D) 2000 UNITS CAPS Take 1 capsule by mouth daily.    [provider]  donepezil (ARICEPT) 23 MG TABS tablet TAKE 1 TABLET BY MOUTH ATBEDTIME. 12/23/14   Jearld Fenton, NP  glipiZIDE (GLUCOTROL) 10 MG tablet Take 1 tablet (10 mg total) by mouth 2 (two) times daily before a meal. 04/22/15   Baity, Coralie Keens, NP  glucose blood (ACCU-CHEK SMARTVIEW) test strip 1 each by Other route 2 (two) times daily. 11/25/14   Jearld Fenton, NP  Insulin Glargine (LANTUS SOLOSTAR) 100 UNIT/ML Solostar Pen Inject 40 Units into the skin daily at 10 pm. 10/28/14   Jearld Fenton, NP  linagliptin (TRADJENTA) 5 MG TABS tablet Take 1 tablet (5 mg total) by mouth daily. 10/28/14   Jearld Fenton, NP  memantine (NAMENDA) 5 MG tablet Take 1 tablet (5 mg total) by mouth 2 (two) times daily. 02/06/15   Jearld Fenton, NP  metFORMIN (GLUCOPHAGE-XR) 750 MG 24 hr tablet Take 1 tablet (750 mg total) by mouth 2 (two) times daily. 04/22/15   Jearld Fenton, NP  multivitamin [facility]) per tablet Take 1 tablet by mouth daily.      [provider]  oxybutynin  (DITROPAN XL) 15 MG 24 hr tablet Take 1 tablet (15 mg total) by mouth at bedtime. 08/01/15   Jearld Fenton, NP  ramipril (ALTACE) 10 MG capsule Take 1 capsule (10 mg total) by mouth daily. 10/28/14   Jearld Fenton, NP  ranitidine (ZANTAC) 150 MG tablet Take 1 tablet (150 mg total) by mouth 2 (two) times daily. 10/28/14   Jearld Fenton, NP  traMADol (ULTRAM) 50 MG tablet Take 1 tablet (50 mg total) by mouth every 8 (eight) hours as needed for moderate pain. 04/10/14   Meredith Staggers, MD    Physical Exam: Vitals:   05/05/20 0858 05/05/20 0900 05/05/20 0915 05/05/20 1013  BP: 113/88 (!) 145/58 (!) 145/58   Pulse: 72 88 75 76  Resp: 15 15 (!) 21 16  Temp:      TempSrc:      SpO2: 94% (!) 84% 100% 100%    General: 80 y.o. female resting in bed in NAD Eyes: PERRL, normal sclera ENMT: Nares patent w/o discharge, orophaynx clear, dentition normal, ears w/o discharge/lesions/ulcers Neck: Supple, trachea midline Cardiovascular: RRR, +S1, S2, no g/r, 3/6 SEM, equal pulses throughout Respiratory: CTABL, no w/r/r, normal WOB GI: BS+, NDNT, no masses noted, no organomegaly noted MSK: No e/c/c; LLE in cast Skin: No rashes, bruises, ulcerations noted Neuro: alert to name, not cooperative with neuro exam Psyc: Agitated; does not want to talk or participate in exam  Labs on Admission: I have personally reviewed following labs and imaging studies  CBC: Recent Labs  Lab 05/05/20 0901  WBC 13.1*  NEUTROABS 11.6*  HGB 6.6*  HCT 24.1*  MCV 67.9*  PLT 161   Basic Metabolic Panel: Recent Labs  Lab 05/05/20 0901  NA 139  K 4.0  CL 107  CO2 22  GLUCOSE 198*  BUN 15  CREATININE 0.57  CALCIUM 8.9   GFR: CrCl cannot be calculated (Unknown ideal weight.). Liver Function Tests: No results for input(s): AST, ALT, ALKPHOS, BILITOT, PROT, ALBUMIN in the last 168 hours. No results for input(s): LIPASE, AMYLASE in the last 168 hours. No results for input(s): AMMONIA in the last 168  hours. Coagulation Profile: No results for input(s): INR, PROTIME in the last 168 hours. Cardiac Enzymes: No results for input(s): CKTOTAL, CKMB, CKMBINDEX, TROPONINI in the last 168 hours. BNP (last 3 results) No results for input(s): PROBNP in the last 8760 hours. HbA1C: No results for input(s): HGBA1C in the last 72 hours. CBG: Recent Labs  Lab 05/05/20 0732  GLUCAP 212*   Lipid Profile: No results for input(s): CHOL, HDL, LDLCALC, TRIG, CHOLHDL, LDLDIRECT in the last 72 hours. Thyroid Function  Tests: No results for input(s): TSH, T4TOTAL, FREET4, T3FREE, THYROIDAB in the last 72 hours. Anemia Panel: No results for input(s): VITAMINB12, FOLATE, FERRITIN, TIBC, IRON, RETICCTPCT in the last 72 hours. Urine analysis:    Component Value Date/Time   COLORURINE YELLOW 02/09/2015 1720   APPEARANCEUR CLEAR 02/09/2015 1720   LABSPEC 1.040 (H) 02/09/2015 1720   PHURINE 5.5 02/09/2015 1720   GLUCOSEU >1000 (A) 02/09/2015 1720   HGBUR NEGATIVE 02/09/2015 1720   BILIRUBINUR neg 04/21/2015 [place] 02/09/2015 1720   PROTEINUR trace 04/21/2015 1703   PROTEINUR NEGATIVE 02/09/2015 1720   UROBILINOGEN negative 04/21/2015 1703   UROBILINOGEN 1.0 03/13/2014 1854   NITRITE neg 04/21/2015 1703   NITRITE NEGATIVE 02/09/2015 1720   LEUKOCYTESUR Negative 04/21/2015 1703    Radiological Exams on Admission: DG Pelvis 1-2 Views  Result Date: 05/05/2020 CLINICAL DATA:  [place]. EXAM: PELVIS - 1-2 VIEW COMPARISON:  03/03/2014 FINDINGS: Both hips are normally located. Stable appearing bilateral hip joint degenerative changes but no definite acute hip fracture. The pubic symphysis and SI joints are intact. Remote healed right pubic rami fractures are noted. No definite acute pelvic fractures. IMPRESSION: 1. No acute bony findings. 2. Remote healed right pubic rami fractures. Electronically Signed   By: Marijo Sanes M.D.   On: 05/05/2020 08:30   DG Ankle Complete Left  Result Date:  05/05/2020 CLINICAL DATA:  [place].  Injured left ankle. EXAM: LEFT ANKLE COMPLETE - 3+ VIEW COMPARISON:  None. FINDINGS: Bimalleolar ankle fractures are noted. There is an oblique minimally displaced fracture involving the lateral malleolus at and above the level of the ankle mortise. There is also a mildly displaced transverse fracture through the base of the medial malleolus at the level of the ankle mortise. No fracture of the posterior malleolus of tibia. The talus is intact. Dense calcification noted posteriorly likely in the Achilles tendon possibly related to a chronic tear. Fairly extensive small vessel calcifications. IMPRESSION: Bimalleolar ankle fractures. Electronically Signed   By: Marijo Sanes M.D.   On: 05/05/2020 08:28   CT Head Wo Contrast  Result Date: 05/05/2020 CLINICAL DATA:  Facial trauma.  Neck trauma.  Unwitnessed fall. EXAM: CT HEAD WITHOUT CONTRAST CT CERVICAL SPINE WITHOUT CONTRAST TECHNIQUE: Multidetector CT imaging of the head and cervical spine was performed following the standard protocol without intravenous contrast. Multiplanar CT image reconstructions of the cervical spine were also generated. COMPARISON:  Head CT 02/09/2015. CT of the cervical spine 03/04/2014. FINDINGS: CT HEAD FINDINGS Brain: Mildly motion degraded exam. Moderate to moderately advanced cerebral atrophy. Comparatively mild cerebellar atrophy. Mild ill-defined and patchy hypoattenuation within the cerebral white matter is nonspecific, but compatible with chronic small vessel ischemic disease. There is no acute intracranial hemorrhage. No demarcated cortical infarct. No extra-axial fluid collection. No evidence of intracranial mass. No midline shift. Partially empty sella turcica. Vascular: No hyperdense vessel.  Atherosclerotic calcifications Skull: Normal. Negative for fracture or focal lesion. Sinuses/Orbits: Visualized orbits show no acute finding. Mild bilateral ethmoid sinus mucosal thickening. Other:  Nonspecific 9 mm polypoid soft tissue focus within the posterior right nasopharynx (series 2, image 5) (series 3, image 8). Trace fluid within the left mastoid air cells. CT CERVICAL SPINE FINDINGS Mildly motion degraded exam. Alignment: Cervical levocurvature. 2 mm C3-C4 grade 1 retrolisthesis. Trace C7-T1 grade 1 anterolisthesis. Skull base and vertebrae: The basion-dental and atlanto-dental intervals are maintained.No evidence of acute fracture to the cervical spine. Soft tissues and spinal canal: No prevertebral fluid or swelling. No  visible canal hematoma. Disc levels: Cervical spondylosis with multilevel disc space narrowing, disc bulges, posterior disc osteophytes, endplate spurring, uncovertebral hypertrophy and facet arthrosis. There is fusion across the C5-C6 disc space as well as facet joint ankylosis on the right at this level. Otherwise, disc space narrowing is greatest at C6-C7 (moderate/advanced). Multilevel spinal canal stenosis. Most notably, calcified disc bulges contribute to moderate/severe spinal canal stenosis at C3-C4 and C4-C5. Multilevel bony neural foraminal narrowing. Degenerative changes are also present at the C1-C2 articulation. Upper chest: No consolidation within the imaged lung apices. Interlobular septal thickening within the imaged lung apices, nonspecific but possibly reflecting edema. Other: Right thyroid lobe nodule measuring at least 2.1 cm. IMPRESSION: CT head: 1. Mildly motion degraded examination. 2. No evidence of acute intracranial abnormality. 3. Moderate to moderately advanced cerebral atrophy with comparatively mild cerebellar atrophy, progressed as compared to the head CT of 02/09/2015. 4. Mild cerebral white matter chronic small vessel ischemic disease. 5. Nonspecific 9 mm polypoid soft tissue focus within the posterior right nasopharynx. Nonemergent ENT consultation recommended. CT cervical spine: 1. Mildly motion degraded examination. 2. No evidence of acute  fracture to the cervical spine. 3. Cervical levocurvature. 4. 2 mm C3-C4 grade 1 retrolisthesis. 5. Trace C7-T1 grade 1 anterolisthesis. 6. Cervical spondylosis as outlined with degenerative appearing fusion at C5-C6. Multilevel spinal canal stenosis. Most notably, there is apparent moderate/severe spinal canal stenosis at C3-C4 and C4-C5. Multilevel bony neural foraminal narrowing. 7. Right thyroid lobe nodule measuring at least 2.1 cm. Nonemergent thyroid ultrasound recommended for further evaluation. Electronically Signed   By: Kellie Simmering DO   On: 05/05/2020 08:57   CT Cervical Spine Wo Contrast  Result Date: 05/05/2020 CLINICAL DATA:  Facial trauma.  Neck trauma.  Unwitnessed fall. EXAM: CT HEAD WITHOUT CONTRAST CT CERVICAL SPINE WITHOUT CONTRAST TECHNIQUE: Multidetector CT imaging of the head and cervical spine was performed following the standard protocol without intravenous contrast. Multiplanar CT image reconstructions of the cervical spine were also generated. COMPARISON:  Head CT 02/09/2015. CT of the cervical spine 03/04/2014. FINDINGS: CT HEAD FINDINGS Brain: Mildly motion degraded exam. Moderate to moderately advanced cerebral atrophy. Comparatively mild cerebellar atrophy. Mild ill-defined and patchy hypoattenuation within the cerebral white matter is nonspecific, but compatible with chronic small vessel ischemic disease. There is no acute intracranial hemorrhage. No demarcated cortical infarct. No extra-axial fluid collection. No evidence of intracranial mass. No midline shift. Partially empty sella turcica. Vascular: No hyperdense vessel.  Atherosclerotic calcifications Skull: Normal. Negative for fracture or focal lesion. Sinuses/Orbits: Visualized orbits show no acute finding. Mild bilateral ethmoid sinus mucosal thickening. Other: Nonspecific 9 mm polypoid soft tissue focus within the posterior right nasopharynx (series 2, image 5) (series 3, image 8). Trace fluid within the left mastoid air  cells. CT CERVICAL SPINE FINDINGS Mildly motion degraded exam. Alignment: Cervical levocurvature. 2 mm C3-C4 grade 1 retrolisthesis. Trace C7-T1 grade 1 anterolisthesis. Skull base and vertebrae: The basion-dental and atlanto-dental intervals are maintained.No evidence of acute fracture to the cervical spine. Soft tissues and spinal canal: No prevertebral fluid or swelling. No visible canal hematoma. Disc levels: Cervical spondylosis with multilevel disc space narrowing, disc bulges, posterior disc osteophytes, endplate spurring, uncovertebral hypertrophy and facet arthrosis. There is fusion across the C5-C6 disc space as well as facet joint ankylosis on the right at this level. Otherwise, disc space narrowing is greatest at C6-C7 (moderate/advanced). Multilevel spinal canal stenosis. Most notably, calcified disc bulges contribute to moderate/severe spinal canal stenosis at C3-C4 and C4-C5.  Multilevel bony neural foraminal narrowing. Degenerative changes are also present at the C1-C2 articulation. Upper chest: No consolidation within the imaged lung apices. Interlobular septal thickening within the imaged lung apices, nonspecific but possibly reflecting edema. Other: Right thyroid lobe nodule measuring at least 2.1 cm. IMPRESSION: CT head: 1. Mildly motion degraded examination. 2. No evidence of acute intracranial abnormality. 3. Moderate to moderately advanced cerebral atrophy with comparatively mild cerebellar atrophy, progressed as compared to the head CT of 02/09/2015. 4. Mild cerebral white matter chronic small vessel ischemic disease. 5. Nonspecific 9 mm polypoid soft tissue focus within the posterior right nasopharynx. Nonemergent ENT consultation recommended. CT cervical spine: 1. Mildly motion degraded examination. 2. No evidence of acute fracture to the cervical spine. 3. Cervical levocurvature. 4. 2 mm C3-C4 grade 1 retrolisthesis. 5. Trace C7-T1 grade 1 anterolisthesis. 6. Cervical spondylosis as  outlined with degenerative appearing fusion at C5-C6. Multilevel spinal canal stenosis. Most notably, there is apparent moderate/severe spinal canal stenosis at C3-C4 and C4-C5. Multilevel bony neural foraminal narrowing. 7. Right thyroid lobe nodule measuring at least 2.1 cm. Nonemergent thyroid ultrasound recommended for further evaluation. Electronically Signed   By: Kellie Simmering DO   On: 05/05/2020 08:57   DG Knee Complete 4 Views Left  Result Date: 05/05/2020 CLINICAL DATA:  [place]. EXAM: LEFT KNEE - COMPLETE 4+ VIEW COMPARISON:  None. FINDINGS: Moderate age related degenerative changes but no acute fracture or osteochondral lesion. Chondrocalcinosis is noted. Small knee joint effusion. Vascular calcifications noted. Remote healed proximal fibular fractures noted. IMPRESSION: 1. No acute bony findings. 2. Small knee joint effusion. 3. 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 Electronically Signed   By: Marijo Sanes M.D.   On: 05/05/2020 08:31    EKG: Independently reviewed. Sinus, no st elevations  Assessment/Plan Left ankle fracture after unwitnessed fall     - admit to inpt, tele     - ortho to see; possible surgery tomorrow  Unwitnessed fall     - not sure if this is syncope or simple mechanical fall     - will check echo for possible syncope     - on able to do orthostatics d/t ankle     - her Hgb is low; possible that this is symptomatic anemia     - PT/OT after surgery  Symptomatic anemia Microcytic anemia     - check iron studies     - get 1 unit pRBCs; consented Co-Guardian     - SCDs for now     - on xarelto for afib; holding xarelto  Leukocytosis     - likely stress related; but will check UA  DM2     - SSI, A1c, DM diet, glucose checks  A fib     - hold xarelto d/t anemia     - monitor on tele  Dementia     - continue home meds when med rec completed  HLD     - continue home statin with med rec completed  HTN     - soft BPs now; hold  home regimen for now; resume as BP tolerates  DVT prophylaxis: SCDs  Code Status: FULL  Family Communication: Spoke with co-guardian Murlean Caller @ [phone]) Consults called: Orthopedics   Status is: Inpatient  Remains inpatient appropriate because:Inpatient level of care appropriate due to severity of illness   Dispo: The patient is from: SNF              Anticipated d/c  is to: SNF              Patient currently is not medically stable to d/c.   Difficult to place patient No  Time spent coordinating admission: 75 minutes  [facility]  If 7PM-7AM, please contact night-coverage [email or web address]  05/05/2020, 10:18 AM

## 2020-05-06 ENCOUNTER — Encounter (HOSPITAL_COMMUNITY)
Admission: EM | Disposition: A | Discharge status: Skilled Nursing Facility | Payer: Self-pay | Source: Skilled Nursing Facility | Attending: Internal Medicine

## 2020-05-06 ENCOUNTER — Encounter (HOSPITAL_COMMUNITY): Payer: Self-pay | Admitting: Internal Medicine

## 2020-05-06 ENCOUNTER — Inpatient Hospital Stay (HOSPITAL_COMMUNITY): Payer: Medicare PPO | Admitting: Certified Registered Nurse Anesthetist

## 2020-05-06 DIAGNOSIS — I350 Nonrheumatic aortic (valve) stenosis: Secondary | ICD-10-CM

## 2020-05-06 DIAGNOSIS — S82892A Other fracture of left lower leg, initial encounter for closed fracture: Secondary | ICD-10-CM | POA: Diagnosis not present

## 2020-05-06 DIAGNOSIS — Z01818 Encounter for other preprocedural examination: Secondary | ICD-10-CM

## 2020-05-06 HISTORY — PX: ORIF ANKLE FRACTURE: SHX5408

## 2020-05-06 LAB — GLUCOSE, CAPILLARY
Glucose-Capillary: 181 mg/dL — ABNORMAL HIGH (ref 70–99)
Glucose-Capillary: 158 mg/dL — ABNORMAL HIGH (ref 70–99)
Glucose-Capillary: 238 mg/dL — ABNORMAL HIGH (ref 70–99)

## 2020-05-06 LAB — COMPREHENSIVE METABOLIC PANEL
ALT: 14 U/L (ref 0–44)
AST: 19 U/L (ref 15–41)
Albumin: 3.2 g/dL — ABNORMAL LOW (ref 3.5–5.0)
Alkaline Phosphatase: 41 U/L (ref 38–126)
Anion gap: 8 (ref 5–15)
BUN: 10 mg/dL (ref 8–23)
CO2: 19 mmol/L — ABNORMAL LOW (ref 22–32)
Calcium: 8.4 mg/dL — ABNORMAL LOW (ref 8.9–10.3)
Chloride: 112 mmol/L — ABNORMAL HIGH (ref 98–111)
Creatinine, Ser: 0.6 mg/dL (ref 0.44–1.00)
GFR, Estimated: >60 mL/min (ref >60)
Glucose, Bld: 124 mg/dL — ABNORMAL HIGH (ref 70–99)
Potassium: 3.5 mmol/L (ref 3.5–5.1)
Sodium: 139 mmol/L (ref 135–145)
Total Bilirubin: 1.7 mg/dL — ABNORMAL HIGH (ref 0.3–1.2)
Total Protein: 5.7 g/dL — ABNORMAL LOW (ref 6.5–8.1)

## 2020-05-06 LAB — CBC
HCT: 26.5 % — ABNORMAL LOW (ref 36.0–46.0)
Hemoglobin: 7.7 g/dL — ABNORMAL LOW (ref 12.0–15.0)
MCH: 20.9 pg — ABNORMAL LOW (ref 26.0–34.0)
MCHC: 29.1 g/dL — ABNORMAL LOW (ref 30.0–36.0)
MCV: 72 fL — ABNORMAL LOW (ref 80.0–100.0)
Platelets: 243 10*3/uL (ref 150–400)
RBC: 3.68 MIL/uL — ABNORMAL LOW (ref 3.87–5.11)
RDW: 21.2 % — ABNORMAL HIGH (ref 11.5–15.5)
WBC: 12 10*3/uL — ABNORMAL HIGH (ref 4.0–10.5)
nRBC: 0 % (ref 0.0–0.2)

## 2020-05-06 LAB — HEMOGLOBIN AND HEMATOCRIT, BLOOD
HCT: 26.2 % — ABNORMAL LOW (ref 36.0–46.0)
HCT: 30.1 % — ABNORMAL LOW (ref 36.0–46.0)
Hemoglobin: 7.6 g/dL — ABNORMAL LOW (ref 12.0–15.0)
Hemoglobin: 8.9 g/dL — ABNORMAL LOW (ref 12.0–15.0)

## 2020-05-06 LAB — PREPARE RBC (CROSSMATCH)

## 2020-05-06 SURGERY — OPEN REDUCTION INTERNAL FIXATION (ORIF) ANKLE FRACTURE
Anesthesia: Monitor Anesthesia Care | Site: Ankle | Laterality: Left

## 2020-05-06 MED ORDER — METOCLOPRAMIDE HCL 5 MG PO TABS: 5.0000 mg | ORAL_TABLET | Freq: Three times a day (TID) | ORAL | Status: DC | PRN

## 2020-05-06 MED ORDER — BISACODYL 10 MG RE SUPP: 10.0000 mg | Freq: Every day | RECTAL | Status: DC | PRN

## 2020-05-06 MED ORDER — DOCUSATE SODIUM 100 MG PO CAPS
100.0000 mg | ORAL_CAPSULE | Freq: Two times a day (BID) | ORAL | Status: DC
  Administered 2020-05-06 – 2020-05-08 (×5): 100 mg via ORAL
  Filled 2020-05-06 (×5): qty 1

## 2020-05-06 MED ORDER — DEXAMETHASONE SODIUM PHOSPHATE 10 MG/ML IJ SOLN
INTRAMUSCULAR | Status: AC
  Filled 2020-05-06: qty 1

## 2020-05-06 MED ORDER — SODIUM CHLORIDE 0.9 % IR SOLN
Status: DC | PRN
  Administered 2020-05-06: 1000 mL

## 2020-05-06 MED ORDER — METOCLOPRAMIDE HCL 5 MG/ML IJ SOLN: 5.0000 mg | Freq: Three times a day (TID) | INTRAMUSCULAR | Status: DC | PRN

## 2020-05-06 MED ORDER — LACTATED RINGERS IV SOLN: INTRAVENOUS | Status: DC | PRN

## 2020-05-06 MED ORDER — SENNA 8.6 MG PO TABS
1.0000 | ORAL_TABLET | Freq: Two times a day (BID) | ORAL | Status: DC
  Administered 2020-05-06 – 2020-05-08 (×5): 8.6 mg via ORAL
  Filled 2020-05-06 (×5): qty 1

## 2020-05-06 MED ORDER — LIDOCAINE 2% (20 MG/ML) 5 ML SYRINGE
INTRAMUSCULAR | Status: AC
  Filled 2020-05-06: qty 5

## 2020-05-06 MED ORDER — ROPIVACAINE HCL 5 MG/ML IJ SOLN
INTRAMUSCULAR | Status: DC | PRN
  Administered 2020-05-06: 40 mL via PERINEURAL

## 2020-05-06 MED ORDER — FENTANYL CITRATE (PF) 100 MCG/2ML IJ SOLN
50.0000 ug | INTRAMUSCULAR | Status: DC
  Administered 2020-05-06: 50 ug via INTRAVENOUS
  Filled 2020-05-06: qty 2

## 2020-05-06 MED ORDER — ONDANSETRON HCL 4 MG/2ML IJ SOLN
INTRAMUSCULAR | Status: AC
  Filled 2020-05-06: qty 2

## 2020-05-06 MED ORDER — SODIUM CHLORIDE 0.9% IV SOLUTION: Freq: Once | INTRAVENOUS | Status: AC

## 2020-05-06 MED ORDER — ONDANSETRON HCL 4 MG/2ML IJ SOLN
INTRAMUSCULAR | Status: DC | PRN
  Administered 2020-05-06: 4 mg via INTRAVENOUS

## 2020-05-06 MED ORDER — FENTANYL CITRATE (PF) 100 MCG/2ML IJ SOLN: 25.0000 ug | INTRAMUSCULAR | Status: DC | PRN

## 2020-05-06 MED ORDER — RIVAROXABAN 20 MG PO TABS
20.0000 mg | ORAL_TABLET | Freq: Every day | ORAL | Status: DC
  Administered 2020-05-07 – 2020-05-08 (×2): 20 mg via ORAL
  Filled 2020-05-06 (×2): qty 1

## 2020-05-06 MED ORDER — CEFAZOLIN SODIUM-DEXTROSE 2-4 GM/100ML-% IV SOLN
2.0000 g | Freq: Four times a day (QID) | INTRAVENOUS | Status: AC
  Administered 2020-05-06 – 2020-05-07 (×3): 2 g via INTRAVENOUS
  Filled 2020-05-06 (×4): qty 100

## 2020-05-06 MED ORDER — POLYETHYLENE GLYCOL 3350 17 G PO PACK: 17.0000 g | PACK | Freq: Every day | ORAL | Status: DC | PRN

## 2020-05-06 MED ORDER — DIPHENHYDRAMINE HCL 12.5 MG/5ML PO ELIX: 12.5000 mg | ORAL_SOLUTION | ORAL | Status: DC | PRN

## 2020-05-06 MED ORDER — MAGNESIUM CITRATE PO SOLN: 1.0000 | Freq: Once | ORAL | Status: DC | PRN

## 2020-05-06 MED ORDER — PROPOFOL 1000 MG/100ML IV EMUL
INTRAVENOUS | Status: AC
  Filled 2020-05-06: qty 100

## 2020-05-06 MED ORDER — DEXAMETHASONE SODIUM PHOSPHATE 10 MG/ML IJ SOLN
INTRAMUSCULAR | Status: DC | PRN
  Administered 2020-05-06: 10 mg via INTRAVENOUS

## 2020-05-06 MED ORDER — SODIUM CHLORIDE 0.9 % IV SOLN
510.0000 mg | Freq: Once | INTRAVENOUS | Status: AC
  Administered 2020-05-06: 510 mg via INTRAVENOUS
  Filled 2020-05-06: qty 510

## 2020-05-06 MED ORDER — FENTANYL CITRATE (PF) 100 MCG/2ML IJ SOLN
INTRAMUSCULAR | Status: DC | PRN
  Administered 2020-05-06: 25 ug via INTRAVENOUS

## 2020-05-06 MED ORDER — PROPOFOL 500 MG/50ML IV EMUL
INTRAVENOUS | Status: DC | PRN
  Administered 2020-05-06: 75 ug/kg/min via INTRAVENOUS

## 2020-05-06 MED ORDER — MIDAZOLAM HCL 2 MG/2ML IJ SOLN
1.0000 mg | INTRAMUSCULAR | Status: DC
  Filled 2020-05-06: qty 2

## 2020-05-06 MED ORDER — FENTANYL CITRATE (PF) 100 MCG/2ML IJ SOLN
INTRAMUSCULAR | Status: AC
  Filled 2020-05-06: qty 2

## 2020-05-06 MED ORDER — AMISULPRIDE (ANTIEMETIC) 5 MG/2ML IV SOLN: 10.0000 mg | Freq: Once | INTRAVENOUS | Status: DC | PRN

## 2020-05-06 MED ORDER — PHENYLEPHRINE HCL (PRESSORS) 10 MG/ML IV SOLN
INTRAVENOUS | Status: AC
  Filled 2020-05-06: qty 1

## 2020-05-06 SURGICAL SUPPLY — 59 items
APL SKNCLS STERI-STRIP NONHPOA (GAUZE/BANDAGES/DRESSINGS) ×1
BAG SPEC THK2 15X12 ZIP CLS (MISCELLANEOUS) ×1
BAG ZIPLOCK 12X15 (MISCELLANEOUS) ×2 IMPLANT
BENZOIN TINCTURE PRP APPL 2/3 (GAUZE/BANDAGES/DRESSINGS) ×2 IMPLANT
BIT DRILL 110X2.5XQCK CNCT (BIT) IMPLANT
BIT DRILL 2.5 (BIT) ×2
BIT DRILL 2.7XCANN QCK CNCT (BIT) ×1 IMPLANT
BIT DRILL CANN 2.7 (BIT) ×2
BIT DRILL STD 2.0MM (DRILL) IMPLANT
BIT DRL 110X2.5XQCK CNCT (BIT) ×1
BIT DRL 2.7XCANN QCK CNCT (BIT) ×1
BNDG COHESIVE 6X5 TAN STRL LF (GAUZE/BANDAGES/DRESSINGS) ×2 IMPLANT
BNDG ELASTIC 4X5.8 VLCR STR LF (GAUZE/BANDAGES/DRESSINGS) ×2 IMPLANT
CLSR STERI-STRIP ANTIMIC 1/2X4 (GAUZE/BANDAGES/DRESSINGS) ×4 IMPLANT
COVER SURGICAL LIGHT HANDLE (MISCELLANEOUS) ×2 IMPLANT
COVER WAND RF STERILE (DRAPES) IMPLANT
CUFF TOURN SGL QUICK 34 (TOURNIQUET CUFF) ×4
CUFF TRNQT CYL 34X4.125X (TOURNIQUET CUFF) ×1 IMPLANT
CUFF TRNQT CYL 34X4X40X1 (TOURNIQUET CUFF) ×1 IMPLANT
DRAPE C-ARM 42X120 X-RAY (DRAPES) ×2 IMPLANT
DRAPE OEC MINIVIEW 54X84 (DRAPES) ×2 IMPLANT
DRAPE U-SHAPE 47X51 STRL (DRAPES) ×2 IMPLANT
DRILL STANDARD 2.0MM (DRILL) ×2
DRSG ADAPTIC 3X8 NADH LF (GAUZE/BANDAGES/DRESSINGS) ×2 IMPLANT
DRSG PAD ABDOMINAL 8X10 ST (GAUZE/BANDAGES/DRESSINGS) ×2 IMPLANT
ELECT REM PT RETURN 15FT ADLT (MISCELLANEOUS) ×2 IMPLANT
GAUZE SPONGE 4X4 12PLY STRL (GAUZE/BANDAGES/DRESSINGS) ×2 IMPLANT
GLOVE ORTHO TXT STRL SZ7.5 (GLOVE) ×2 IMPLANT
GLOVE SRG 8 PF TXTR STRL LF DI (GLOVE) ×1 IMPLANT
GLOVE SURG LTX SZ7.5 (GLOVE) ×2 IMPLANT
GLOVE SURG UNDER POLY LF SZ8 (GLOVE) ×2
GOWN STRL REUS W/TWL LRG LVL3 (GOWN DISPOSABLE) ×2 IMPLANT
GUIDEWIRE PIN ORTH 6X1.6XSMTH (WIRE) IMPLANT
K-WIRE 1.6 (WIRE) ×2
KIT TURNOVER KIT A (KITS) ×2 IMPLANT
PACK ORTHO EXTREMITY (CUSTOM PROCEDURE TRAY) ×2 IMPLANT
PAD CAST 4YDX4 CTTN HI CHSV (CAST SUPPLIES) ×4 IMPLANT
PADDING CAST COTTON 4X4 STRL (CAST SUPPLIES) ×8
PENCIL SMOKE EVACUATOR (MISCELLANEOUS) IMPLANT
PLATE LOCK DIST FIB 2.7X80 LT (Plate) ×1 IMPLANT
PROTECTOR NERVE ULNAR (MISCELLANEOUS) ×2 IMPLANT
SCREW 2.7X16MM (Screw) ×4 IMPLANT
SCREW 3.5X16MM (Screw) ×4 IMPLANT
SCREW BN 2.7X16X3.5XST NS (Screw) IMPLANT
SCREW CANN 1/3 THRD RVRS CT (Screw) ×1 IMPLANT
SCREW CANNULATED 4.0X40 (Screw) ×2 IMPLANT
SCREW CORT 2.5X20X2.7XST SM (Screw) ×1 IMPLANT
SCREW CORT S/T 3.5X22 (Screw) ×1 IMPLANT
SCREW CORTICAL 2.7X18MM (Screw) ×3 IMPLANT
SCREW CORTICAL 2.7X20MM (Screw) ×2 IMPLANT
SCREW CORTICAL 3.5X18 (Screw) ×2 IMPLANT
SCREW NLOCK CORT 2.7X16 NS (Screw) ×2 IMPLANT
SPLINT PLASTER CAST XFAST 5X30 (CAST SUPPLIES) ×1 IMPLANT
SPLINT PLASTER XFAST SET 5X30 (CAST SUPPLIES) ×1
STRIP CLOSURE SKIN 1/2X4 (GAUZE/BANDAGES/DRESSINGS) ×2 IMPLANT
SUT MNCRL AB 4-0 PS2 18 (SUTURE) ×2 IMPLANT
SUT VIC AB 3-0 SH 8-18 (SUTURE) ×2 IMPLANT
TOWEL OR 17X26 10 PK STRL BLUE (TOWEL DISPOSABLE) ×4 IMPLANT
WASHER SM (Washer) ×1 IMPLANT

## 2020-05-06 NOTE — Consult Note (Addendum)
Cardiology Consultation:   Patient ID: Denise [name] MRN: [id number]; DOB: 1940-05-07  Admit date: 05/05/2020 Date of Consult: 05/06/2020  PCP:  Jearld Fenton, NP   [place]  Cardiologist:  No primary care provider on file. New Advanced Practice Provider:  No care team member to display Electrophysiologist:  None  [id number]}    Patient Profile:   Denise [name] is a 80 y.o. female with a hx of DM, HTN, HLD, OA, asthma, COPD, GERD, Afib on Xarelto, dementia, HOH, who is being seen today for the preop evaluation of ankle surgery at the request of Dr Maylene Roes.  History of Present Illness:   [name] lives in a facility. She had an unwitnessed fall. Not able to provide much information. She needs ankle surgery, Cards asked to see.  Information was obtained from staff and chart notes, [name] is not able to provide much info. No family is present.  [name] is oriented to her name only. She does not know that she is in the hospital. She does not remember falling. She does not know that her ankle is broken.  No info is available on baseline activity level.   She is resting comfortably in bed. She denies pain at baseline, but her ankle/foot are painful to touch. She denies SOB. She denies chest pain or palpitations. She denies being light-headed or dizzy. She denies any hx of syncope.   Past Medical History:  Diagnosis Date  . Arthritis   . Asthma   . Asthma   . Carpal tunnel syndrome 09/2006   bilateral   . Cataracts, bilateral 12/2010  . COPD (chronic obstructive pulmonary disease) ([place])   . Diabetes mellitus   . GERD (gastroesophageal reflux disease)   . Hx of atrial fibrillation, no current medication   . Hyperlipidemia   . Hypertension   . Osteopenia   . Plantar fasciitis 2010    Past Surgical History:  Procedure Laterality Date  . BREAST BIOPSY Left 2001   . cardiac ablation in 2001 for a fib    . CARPAL TUNNEL RELEASE Bilateral 09/2006   . colopnoscopy  05/17/2001  . oophorectomy cyst    . TONSILLECTOMY       Home Medications:  Prior to Admission medications   Medication Sig Start Date End Date Taking? Authorizing Provider  acetaminophen (TYLENOL) 500 MG tablet Take 500 mg by mouth every 4 (four) hours as needed for mild pain.   Yes [provider]  atorvastatin (LIPITOR) 10 MG tablet Take 10 mg by mouth at bedtime.   Yes [provider]  calcium carbonate (OSCAL) 1500 (600 Ca) MG TABS tablet Take 600 mg of elemental calcium by mouth 2 (two) times daily with a meal.   Yes [provider]  donepezil (ARICEPT) 10 MG tablet Take 10 mg by mouth at bedtime.   Yes [provider]  Fluticasone-Salmeterol (ADVAIR) 100-50 MCG/DOSE AEPB Inhale 1 puff into the lungs 2 (two) times daily.   Yes [provider]  glimepiride (AMARYL) 4 MG tablet Take 4 mg by mouth daily with breakfast.   Yes [provider]  insulin lispro (HUMALOG) 100 UNIT/ML injection Inject 8 Units into the skin every morning.   Yes [provider]  linagliptin (TRADJENTA) 5 MG TABS tablet Take 1 tablet (5 mg total) by mouth daily. 10/28/14  Yes Jearld Fenton, NP  loperamide (IMODIUM A-D) 2 MG tablet Take 2 mg by mouth daily as needed for  diarrhea or loose stools (max of 4 tablets in 24hrs).   Yes [provider]  memantine (NAMENDA) 10 MG tablet Take 10 mg by mouth 2 (two) times daily.   Yes [provider]  metFORMIN (GLUCOPHAGE) 500 MG tablet Take 500 mg by mouth 2 (two) times daily with a meal.   Yes [provider]  Multiple Vitamins-Minerals (CERTAVITE SENIOR PO) Take 1 tablet by mouth daily.   Yes [provider]  ondansetron (ZOFRAN) 4 MG tablet Take 4 mg by mouth every 8 (eight) hours as needed for nausea or vomiting.   Yes [provider]  ramipril (ALTACE) 10 MG capsule Take 1 capsule (10 mg total) by mouth daily. 10/28/14  Yes Jearld Fenton, NP   rivaroxaban (XARELTO) 20 MG TABS tablet Take 20 mg by mouth daily with supper.   Yes [provider]  sertraline (ZOLOFT) 50 MG tablet Take 50 mg by mouth daily.   Yes [provider]  Zinc Oxide (DESITIN) 13 % CREA Apply 1 application topically See admin instructions. Apply to bottom every shift   Yes [provider]  albuterol (PROVENTIL HFA;VENTOLIN HFA) 108 (90 BASE) MCG/ACT inhaler Inhale 2 puffs into the lungs every 6 (six) hours as needed. Patient not taking: No sig reported 10/28/14   Jearld Fenton, NP  alendronate (FOSAMAX) 70 MG tablet TAKE 1 TABLET BY MOUTH ONCE A WEEK WITH FULL GLASS OF WATER ON EMPTY STOMACH Patient not taking: No sig reported 05/07/15   Jearld Fenton, NP  atorvastatin (LIPITOR) 20 MG tablet Take 1 tablet (20 mg total) by mouth daily. Patient not taking: No sig reported 10/28/14   Jearld Fenton, NP  BD PEN NEEDLE NANO U/F 32G X 4 MM MISC USE DAILY AS DIRECTED 12/31/14   Jearld Fenton, NP  Blood Glucose Monitoring Suppl (ACCU-CHEK NANO SMARTVIEW) W/DEVICE KIT by Does not apply route.    [provider]  donepezil (ARICEPT) 23 MG TABS tablet TAKE 1 TABLET BY MOUTH ATBEDTIME. Patient not taking: No sig reported 12/23/14   Jearld Fenton, NP  glipiZIDE (GLUCOTROL) 10 MG tablet Take 1 tablet (10 mg total) by mouth 2 (two) times daily before a meal. Patient not taking: No sig reported 04/22/15   Jearld Fenton, NP  glucose blood (ACCU-CHEK SMARTVIEW) test strip 1 each by Other route 2 (two) times daily. 11/25/14   Jearld Fenton, NP  Insulin Glargine (LANTUS SOLOSTAR) 100 UNIT/ML Solostar Pen Inject 40 Units into the skin daily at 10 pm. Patient not taking: No sig reported 10/28/14   Jearld Fenton, NP  memantine (NAMENDA) 5 MG tablet Take 1 tablet (5 mg total) by mouth 2 (two) times daily. Patient not taking: No sig reported 02/06/15   Jearld Fenton, NP  metFORMIN (GLUCOPHAGE-XR) 750 MG 24 hr tablet Take 1 tablet (750 mg  total) by mouth 2 (two) times daily. Patient not taking: No sig reported 04/22/15   Jearld Fenton, NP  oxybutynin (DITROPAN XL) 15 MG 24 hr tablet Take 1 tablet (15 mg total) by mouth at bedtime. Patient not taking: Reported on 05/05/2020 08/01/15   Jearld Fenton, NP  ranitidine (ZANTAC) 150 MG tablet Take 1 tablet (150 mg total) by mouth 2 (two) times daily. Patient not taking: Reported on 05/05/2020 10/28/14   Jearld Fenton, NP  traMADol (ULTRAM) 50 MG tablet Take 1 tablet (50 mg total) by mouth every 8 (eight) hours as needed for moderate pain.  Patient not taking: Reported on 05/05/2020 04/10/14   Meredith Staggers, MD    Inpatient Medications: Scheduled Meds: . acetaminophen  1,000 mg Oral On Call to OR  . atorvastatin  10 mg Oral QHS  . Chlorhexidine Gluconate Cloth  6 each Topical Daily  . donepezil  10 mg Oral QHS  . insulin aspart  0-9 Units Subcutaneous TID WC  . memantine  10 mg Oral BID  . mupirocin ointment  1 application Nasal BID  . povidone-iodine  2 application Topical Once  . ramipril  10 mg Oral Daily  . sertraline  50 mg Oral Daily  . sodium chloride flush  3 mL Intravenous Q12H   Continuous Infusions: . sodium chloride Stopped (05/05/20 1015)  .  ceFAZolin (ANCEF) IV     PRN Meds: acetaminophen **OR** acetaminophen, HYDROcodone-acetaminophen, ondansetron **OR** ondansetron (ZOFRAN) IV  Allergies:    Allergies  Allergen Reactions  . Codeine Other (See Comments)    unknown  . Sulfonamide Derivatives Other (See Comments)    unknown    Social History:   Social History   Socioeconomic History  . Marital status: Married    Spouse name: Not on file  . Number of children: Not on file  . Years of education: Not on file  . Highest education level: Not on file  Occupational History  . Not on file  Tobacco Use  . Smoking status: Never Smoker  . Smokeless tobacco: Never Used  Substance and Sexual Activity  . Alcohol use: No  . Drug use: No  . Sexual  activity: Yes    Partners: Male    Birth control/protection: Post-menopausal  Other Topics Concern  . Not on file  Social History Narrative  . Not on file   Social Determinants of Health   Financial Resource Strain: Not on file  Food Insecurity: Not on file  Transportation Needs: Not on file  Physical Activity: Not on file  Stress: Not on file  Social Connections: Not on file  Intimate Partner Violence: Not on file    Family History:   Family History  Problem Relation Age of Onset  . Heart disease Mother   . Cancer Mother        ovarian  . Diabetes Neg Hx   . Stroke Neg Hx     Family Status  Relation Name Status  . Mother 61  yo Deceased  . Father 34yo Deceased  . Neg Hx  (Not Specified)     ROS:  Please see the history of present illness.  ROS not obtainable due to pt condition.    Physical Exam/Data:   Vitals:   05/05/20 2136 05/06/20 0639 05/06/20 0800 05/06/20 0815  BP: (!) 123/52 (!) 149/53 (!) 138/53 (!) 137/43  Pulse: 72 77 70 71  Resp: _0 Temp: 99.3 F (37.4 C) 98 F (36.7 C) 98.5 F (36.9 C) 98.4 F (36.9 C)  TempSrc: Oral  Axillary Axillary  SpO2: 92% 96%  98%  Weight:      Height:        Intake/Output Summary (Last 24 hours) at 05/06/2020 0949 Last data filed at 05/06/2020 0818 Gross per 24 hour  Intake 1575.98 ml  Output 175 ml  Net 1400.98 ml   Last 3 Weights 05/05/2020 06/09/2015 04/21/2015  Weight (lbs) 175 lb 14.8 oz (No Data) 196 lb  Weight (kg) 79.8 kg (No Data) 88.905 kg     Body mass index is 33.24 kg/m.  General:  Well nourished, well developed, elderly female,in no acute distress HEENT: normal Lymph: no adenopathy Neck: no JVD Endocrine:  No thryomegaly Vascular: No carotid bruits; 3/4 pulses 2+, R foot splinted, not disturbed, U/A to ck DP/PT pulses but cap refill WNL Cardiac:  normal S1, S2; RRR; 3/6 classic AS murmur  Lungs:  clear to auscultation bilaterally, no wheezing, rhonchi or rales  Abd: soft,  nontender, no hepatomegaly  Ext: no edema Musculoskeletal:  LLE splinted, BUE strength weak but equal Skin: warm and dry  Neuro:  CNs 2-12 intact, no focal abnormalities noted Psych:  Normal affect, oriented to name only  EKG:  The EKG was personally reviewed and demonstrates:  SR, HR 84, PR 287 ms, PVC seen, diffuse lateral T wave flattening new since 2017 Telemetry:  Telemetry was personally reviewed and demonstrates:  SR, non-conducted PACs  Relevant CV Studies: ECHO: 05/05/2020 1. There is severe aortic stenosis with AVA 0.8 by continuity equation,  mean gradient 60mHg, peak gradient 758mg, Vmax 4.1775m DI 0.27.  2. There is mild calcific mitral stenosis with mean gradient 8mm42mat HR  74bpm. MVA by continuity equation is 1.2cm2  3. Left ventricular ejection fraction, by estimation, is 60 to 65%. The  left ventricle has normal function. The left ventricle has no regional  wall motion abnormalities. There is moderate concentric left ventricular  hypertrophy. Diastolic function  indeterminant due to severe MAC. Elevated left atrial pressure.  4. Right ventricular systolic function is normal. The right ventricular  size is normal. Tricuspid regurgitation signal is inadequate for assessing  PA pressure.  5. Left atrial size was mildly dilated.  6. The mitral valve is abnormal. Trivial mitral valve regurgitation.  Severe mitral annular calcification.  7. The aortic valve is calcified. There is severe calcifcation of the  aortic valve. There is severe thickening of the aortic valve. Aortic valve  regurgitation is trivial. Severe aortic valve stenosis.   Laboratory Data:  High Sensitivity Troponin:  No results for input(s): TROPONINIHS in the last 720 hours.   Chemistry Recent Labs  Lab 05/05/20 0901 05/06/20 0046  NA 139 139  K 4.0 3.5  CL 107 112*  CO2 22 19*  GLUCOSE 198* 124*  BUN 15 10  CREATININE 0.57 0.60  CALCIUM 8.9 8.4*  GFRNONAA >60 >60  ANIONGAP 10 8     Recent Labs  Lab 05/06/20 0046  PROT 5.7*  ALBUMIN 3.2*  AST 19  ALT 14  ALKPHOS 41  BILITOT 1.7*   Hematology Recent Labs  Lab 05/05/20 0901 05/05/20 1955 05/06/20 0046  WBC 13.1*  --  12.0*  RBC 3.55*  --  3.68*  HGB 6.6* 8.2* 7.6*  7.7*  HCT 24.1* 27.7* 26.2*  26.5*  MCV 67.9*  --  72.0*  MCH 18.6*  --  20.9*  MCHC 27.4*  --  29.1*  RDW 19.3*  --  21.2*  PLT 305  --  243   BNPNo results for input(s): BNP, PROBNP in the last 168 hours.  DDimer No results for input(s): DDIMER in the last 168 hours. Lab Results  Component Value Date   TSH 1.72 10/24/2014     Radiology/Studies:  DG Pelvis 1-2 Views  Result Date: 05/05/2020 [place]  [place] PEL[place] - 1-2 VIEW COMPARISON:  03/03/2014 FINDINGS: Both hips are normally located. Stable appearing bilateral hip joint degenerative changes but no definite acute hip fracture. The pubic symphysis and SI joints are intact. Remote healed right pubic rami fractures are noted. No  definite acute pelvic fractures. IMPRESSION: 1. No acute bony findings. 2. Remote healed right pubic rami fractures. Electronically Signed   By: Marijo Sanes M.D.   On: 05/05/2020 08:30   DG Ankle Complete Left  Result Date: 05/05/2020 CLINICAL DATA:  [place].  Injured left ankle. EXAM: LEFT ANKLE COMPLETE - 3+ VIEW COMPARISON:  None. FINDINGS: Bimalleolar ankle fractures are noted. There is an oblique minimally displaced fracture involving the lateral malleolus at and above the level of the ankle mortise. There is also a mildly displaced transverse fracture through the base of the medial malleolus at the level of the ankle mortise. No fracture of the posterior malleolus of tibia. The talus is intact. Dense calcification noted posteriorly likely in the Achilles tendon possibly related to a chronic tear. Fairly extensive small vessel calcifications. IMPRESSION: Bimalleolar ankle fractures. Electronically Signed   By: Marijo Sanes M.D.   On: 05/05/2020  08:28   CT Head Wo Contrast  Result Date: 05/05/2020 CLINICAL DATA:  Facial trauma.  Neck trauma.  Unwitnessed fall. EXAM: CT HEAD WITHOUT CONTRAST CT CERVICAL SPINE WITHOUT CONTRAST TECHNIQUE: Multidetector CT imaging of the head and cervical spine was performed following the standard protocol without intravenous contrast. Multiplanar CT image reconstructions of the cervical spine were also generated. COMPARISON:  Head CT 02/09/2015. CT of the cervical spine 03/04/2014. FINDINGS: CT HEAD FINDINGS Brain: Mildly motion degraded exam. Moderate to moderately advanced cerebral atrophy. Comparatively mild cerebellar atrophy. Mild ill-defined and patchy hypoattenuation within the cerebral white matter is nonspecific, but compatible with chronic small vessel ischemic disease. There is no acute intracranial hemorrhage. No demarcated cortical infarct. No extra-axial fluid collection. No evidence of intracranial mass. No midline shift. Partially empty sella turcica. Vascular: No hyperdense vessel.  Atherosclerotic calcifications Skull: Normal. Negative for fracture or focal lesion. Sinuses/Orbits: Visualized orbits show no acute finding. Mild bilateral ethmoid sinus mucosal thickening. Other: Nonspecific 9 mm polypoid soft tissue focus within the posterior right nasopharynx (series 2, image 5) (series 3, image 8). Trace fluid within the left mastoid air cells. CT CERVICAL SPINE FINDINGS Mildly motion degraded exam. Alignment: Cervical levocurvature. 2 mm C3-C4 grade 1 retrolisthesis. Trace C7-T1 grade 1 anterolisthesis. Skull base and vertebrae: The basion-dental and atlanto-dental intervals are maintained.No evidence of acute fracture to the cervical spine. Soft tissues and spinal canal: No prevertebral fluid or swelling. No visible canal hematoma. Disc levels: Cervical spondylosis with multilevel disc space narrowing, disc bulges, posterior disc osteophytes, endplate spurring, uncovertebral hypertrophy and facet  arthrosis. There is fusion across the C5-C6 disc space as well as facet joint ankylosis on the right at this level. Otherwise, disc space narrowing is greatest at C6-C7 (moderate/advanced). Multilevel spinal canal stenosis. Most notably, calcified disc bulges contribute to moderate/severe spinal canal stenosis at C3-C4 and C4-C5. Multilevel bony neural foraminal narrowing. Degenerative changes are also present at the C1-C2 articulation. Upper chest: No consolidation within the imaged lung apices. Interlobular septal thickening within the imaged lung apices, nonspecific but possibly reflecting edema. Other: Right thyroid lobe nodule measuring at least 2.1 cm. IMPRESSION: CT head: 1. Mildly motion degraded examination. 2. No evidence of acute intracranial abnormality. 3. Moderate to moderately advanced cerebral atrophy with comparatively mild cerebellar atrophy, progressed as compared to the head CT of 02/09/2015. 4. Mild cerebral white matter chronic small vessel ischemic disease. 5. Nonspecific 9 mm polypoid soft tissue focus within the posterior right nasopharynx. Nonemergent ENT consultation recommended. CT cervical spine: 1. Mildly motion degraded examination. 2. No evidence of acute fracture  to the cervical spine. 3. Cervical levocurvature. 4. 2 mm C3-C4 grade 1 retrolisthesis. 5. Trace C7-T1 grade 1 anterolisthesis. 6. Cervical spondylosis as outlined with degenerative appearing fusion at C5-C6. Multilevel spinal canal stenosis. Most notably, there is apparent moderate/severe spinal canal stenosis at C3-C4 and C4-C5. Multilevel bony neural foraminal narrowing. 7. Right thyroid lobe nodule measuring at least 2.1 cm. Nonemergent thyroid ultrasound recommended for further evaluation. Electronically Signed   By: Kellie Simmering DO   On: 05/05/2020 08:57   CT Cervical Spine Wo Contrast  Result Date: 05/05/2020 CLINICAL DATA:  Facial trauma.  Neck trauma.  Unwitnessed fall. EXAM: CT HEAD WITHOUT CONTRAST CT CERVICAL  SPINE WITHOUT CONTRAST TECHNIQUE: Multidetector CT imaging of the head and cervical spine was performed following the standard protocol without intravenous contrast. Multiplanar CT image reconstructions of the cervical spine were also generated. COMPARISON:  Head CT 02/09/2015. CT of the cervical spine 03/04/2014. FINDINGS: CT HEAD FINDINGS Brain: Mildly motion degraded exam. Moderate to moderately advanced cerebral atrophy. Comparatively mild cerebellar atrophy. Mild ill-defined and patchy hypoattenuation within the cerebral white matter is nonspecific, but compatible with chronic small vessel ischemic disease. There is no acute intracranial hemorrhage. No demarcated cortical infarct. No extra-axial fluid collection. No evidence of intracranial mass. No midline shift. Partially empty sella turcica. Vascular: No hyperdense vessel.  Atherosclerotic calcifications Skull: Normal. Negative for fracture or focal lesion. Sinuses/Orbits: Visualized orbits show no acute finding. Mild bilateral ethmoid sinus mucosal thickening. Other: Nonspecific 9 mm polypoid soft tissue focus within the posterior right nasopharynx (series 2, image 5) (series 3, image 8). Trace fluid within the left mastoid air cells. CT CERVICAL SPINE FINDINGS Mildly motion degraded exam. Alignment: Cervical levocurvature. 2 mm C3-C4 grade 1 retrolisthesis. Trace C7-T1 grade 1 anterolisthesis. Skull base and vertebrae: The basion-dental and atlanto-dental intervals are maintained.No evidence of acute fracture to the cervical spine. Soft tissues and spinal canal: No prevertebral fluid or swelling. No visible canal hematoma. Disc levels: Cervical spondylosis with multilevel disc space narrowing, disc bulges, posterior disc osteophytes, endplate spurring, uncovertebral hypertrophy and facet arthrosis. There is fusion across the C5-C6 disc space as well as facet joint ankylosis on the right at this level. Otherwise, disc space narrowing is greatest at C6-C7  (moderate/advanced). Multilevel spinal canal stenosis. Most notably, calcified disc bulges contribute to moderate/severe spinal canal stenosis at C3-C4 and C4-C5. Multilevel bony neural foraminal narrowing. Degenerative changes are also present at the C1-C2 articulation. Upper chest: No consolidation within the imaged lung apices. Interlobular septal thickening within the imaged lung apices, nonspecific but possibly reflecting edema. Other: Right thyroid lobe nodule measuring at least 2.1 cm. IMPRESSION: CT head: 1. Mildly motion degraded examination. 2. No evidence of acute intracranial abnormality. 3. Moderate to moderately advanced cerebral atrophy with comparatively mild cerebellar atrophy, progressed as compared to the head CT of 02/09/2015. 4. Mild cerebral white matter chronic small vessel ischemic disease. 5. Nonspecific 9 mm polypoid soft tissue focus within the posterior right nasopharynx. Nonemergent ENT consultation recommended. CT cervical spine: 1. Mildly motion degraded examination. 2. No evidence of acute fracture to the cervical spine. 3. Cervical levocurvature. 4. 2 mm C3-C4 grade 1 retrolisthesis. 5. Trace C7-T1 grade 1 anterolisthesis. 6. Cervical spondylosis as outlined with degenerative appearing fusion at C5-C6. Multilevel spinal canal stenosis. Most notably, there is apparent moderate/severe spinal canal stenosis at C3-C4 and C4-C5. Multilevel bony neural foraminal narrowing. 7. Right thyroid lobe nodule measuring at least 2.1 cm. Nonemergent thyroid ultrasound recommended for further evaluation. Electronically Signed  By: Kellie Simmering DO   On: 05/05/2020 08:57   DG Knee Complete 4 Views Left  Result Date: 05/05/2020 CLINICAL DATA:  [place]. EXAM: LEFT KNEE - COMPLETE 4+ VIEW COMPARISON:  None. FINDINGS: Moderate age related degenerative changes but no acute fracture or osteochondral lesion. Chondrocalcinosis is noted. Small knee joint effusion. Vascular calcifications noted. Remote healed  proximal fibular fractures noted. IMPRESSION: 1. No acute bony findings. 2. Small knee joint effusion. 3. 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 Electronically Signed   By: Marijo Sanes M.D.   On: 05/05/2020 08:31   ECHOCARDIOGRAM COMPLETE  Result Date: 05/05/2020    ECHOCARDIOGRAM REPORT   Patient Name:   Denise [name] Date of Exam: 05/05/2020 Medical Rec #:  [id number]       Height:       63.0 in Accession #:    [id number]      Weight:       196.0 lb Date of Birth:  25-Aug-1940       BSA:          1.917 m Patient Age:    72 years        BP:           123/51 mmHg Patient Gender: F               HR:           69 bpm. Exam Location:  Inpatient Procedure: 2D Echo, 3D Echo, Cardiac Doppler and Color Doppler Indications:    R55 Syncope  History:        Patient has no prior history of Echocardiogram examinations.                 Abnormal ECG, Arrythmias:Atrial Fibrillation,                 Signs/Symptoms:Alzheimer's and Altered Mental Status; Risk                 Factors:Hypertension, Diabetes and Dyslipidemia.  Sonographer:    Roseanna Rainbow RDCS Referring Phys: [id number] Jonnie Finner  Sonographer Comments: Technically difficult study due to poor echo windows. Image acquisition challenging due to uncooperative patient. Patient supine and pushed my hand away at the end of study. IMPRESSIONS  1. There is severe aortic stenosis with AVA 0.8 by continuity equation, mean gradient 47mHg, peak gradient 730mg, Vmax 4.1714m DI 0.27.  2. There is mild calcific mitral stenosis with mean gradient 8mm15mat HR 74bpm. MVA by continuity equation is 1.2cm2  3. Left ventricular ejection fraction, by estimation, is 60 to 65%. The left ventricle has normal function. The left ventricle has no regional wall motion abnormalities. There is moderate concentric left ventricular hypertrophy. Diastolic function indeterminant due to severe MAC. Elevated left atrial pressure.  4. Right ventricular systolic function is  normal. The right ventricular size is normal. Tricuspid regurgitation signal is inadequate for assessing PA pressure.  5. Left atrial size was mildly dilated.  6. The mitral valve is abnormal. Trivial mitral valve regurgitation. Severe mitral annular calcification.  7. The aortic valve is calcified. There is severe calcifcation of the aortic valve. There is severe thickening of the aortic valve. Aortic valve regurgitation is trivial. Severe aortic valve stenosis. Comparison(s): No prior Echocardiogram. FINDINGS  Left Ventricle: Left ventricular ejection fraction, by estimation, is 60 to 65%. The left ventricle has normal function. The left ventricle has no regional wall motion abnormalities. The left ventricular internal cavity size was normal in size.  There is  moderate concentric left ventricular hypertrophy. Diastolic function indeterminant due to severe MAC. Elevated left atrial pressure. The E/e' is 80. Right Ventricle: The right ventricular size is normal. No increase in right ventricular wall thickness. Right ventricular systolic function is normal. Tricuspid regurgitation signal is inadequate for assessing PA pressure. Left [place]: Left atrial size was mildly dilated. Right Atrium: Right atrial size was normal in size. Pericardium: There is no evidence of pericardial effusion. Mitral Valve: There is mild calcific mitral stenosis with mean gradient 50mHg at HR 74bpm. MVA by continuity equation is 1.2cm2. The mitral valve is abnormal. There is moderate thickening of the mitral valve leaflet(s). There is moderate calcification of  the mitral valve leaflet(s). Severe mitral annular calcification. Trivial mitral valve regurgitation. MV peak gradient, 25.5 mmHg. The mean mitral valve gradient is 8.0 mmHg. Tricuspid Valve: The tricuspid valve is normal in structure. Tricuspid valve regurgitation is trivial. Aortic Valve: There is severe aortic stenosis with AVA 0.8 by continuity equation, mean gradient 432mg, peak  gradient 7034m, Vmax 4.39m90mDI 0.27. The aortic valve is calcified. There is severe calcifcation of the aortic valve. There is severe thickening of the aortic valve. Aortic valve regurgitation is trivial. Severe aortic stenosis is present. Aortic valve mean gradient measures 42.2 mmHg. Aortic valve peak gradient measures 69.6 mmHg. Aortic valve area, by VTI measures 1.29 cm. Pulmonic Valve: The pulmonic valve was not well visualized. Pulmonic valve regurgitation is trivial. Aorta: The aortic root is normal in size and structure. IAS/Shunts: No atrial level shunt detected by color flow Doppler.  LEFT VENTRICLE PLAX 2D LVIDd:         4.50 cm     Diastology LVIDs:         3.00 cm     LV e' medial:    6.20 cm/s LV PW:         1.60 cm     LV E/e' medial:  26.9 LV IVS:        1.30 cm     LV e' lateral:   5.44 cm/s LVOT diam:     1.90 cm     LV E/e' lateral: 30.7 LV SV:         130 LV SV Index:   68 LVOT Area:     2.84 cm  LV Volumes (MOD) LV vol d, MOD A2C: 58.8 ml LV vol d, MOD A4C: 67.8 ml LV vol s, MOD A2C: 29.8 ml LV vol s, MOD A4C: 21.2 ml LV SV MOD A2C:     29.0 ml LV SV MOD A4C:     67.8 ml LV SV MOD BP:      38.2 ml RIGHT VENTRICLE            IVC RV S prime:     9.46 cm/s  IVC diam: 2.10 cm TAPSE (M-mode): 2.2 cm LEFT ATRIUM             Index       RIGHT ATRIUM           Index LA diam:        4.00 cm 2.09 cm/m  RA Area:     11.00 cm LA Vol (A2C):   41.2 ml 21.49 ml/m RA Volume:   21.10 ml  11.01 ml/m LA Vol (A4C):   47.4 ml 24.72 ml/m LA Biplane Vol: 45.0 ml 23.47 ml/m  AORTIC VALVE AV Area (Vmax):    1.42 cm AV Area (Vmean):   1.30 cm  AV Area (VTI):     1.29 cm AV Vmax:           417.25 cm/s AV Vmean:          306.500 cm/s AV VTI:            1.002 m AV Peak Grad:      69.6 mmHg AV Mean Grad:      42.2 mmHg LVOT Vmax:         209.00 cm/s LVOT Vmean:        141.000 cm/s LVOT VTI:          0.457 m LVOT/AV VTI ratio: 0.46  AORTA Ao Root diam: 2.60 cm MITRAL VALVE MV Area (PHT): 4.39 cm     SHUNTS MV  Area VTI:   1.89 cm     Systemic VTI:  0.46 m MV Peak grad:  25.5 mmHg    Systemic Diam: 1.90 cm MV Mean grad:  8.0 mmHg MV Vmax:       2.53 m/s MV Vmean:      120.5 cm/s MV Decel Time: 173 msec MV E velocity: 167.00 cm/s MV A velocity: 214.00 cm/s MV E/A ratio:  0.78 Gwyndolyn Kaufman MD Electronically signed by Gwyndolyn Kaufman MD Signature Date/Time: 05/05/2020/4:36:45 PM    Final      Assessment and Plan:   1. Preop cardiovascular eval - pta activity level not known - no known hx ischemic sx, no known hx presyncope/syncope - no CHF by exam -ECG not acute - AS is severe, no prev echo in system - she is at increased risk for surgery, MD advise if this is prohibitive  2. AS, severe - mean gradient 40, peak 69.6 mmHg - AoV area 0.8 cmsq - not a candidate for any procedures - follow for sx - pt getting transfused 2 U, MD advise if pt should get Lasix (none ordered)   Otherwise, per IM Principal Problem:   Ankle fracture, left Active Problems:   Pressure injury of skin     Risk Assessment/Risk Scores:        For questions or updates, please contact [facility] Please consult [email or web address] for contact info under    Signed, Rosaria Ferries, PA-C  05/06/2020 9:49 AM  Patient seen and examined with Rosaria Ferries PA-C.  Agree as above, with the following exceptions and changes as noted below. Seen in Short Stay prior to surgery. She is pleasant but confused and has no complaints. Gen: NAD, CV: RRR, 3/6 late peaking SEM, obscures S2, Lungs: clear, Abd: soft, Extrem: warm, LLE splinted Neuro/Psych: pleasant, not oriented. All available labs, radiology testing, previous records reviewed.   The patient is very high risk for intermediate risk procedure given anemia and severe AS. Given dementia, not a good candidate for interventions on the aortic valve, and these would not likely precede ankle surgery.  No further cardiovascular testing is required prior to the procedure.  If  this level of risk is acceptable to the patient and surgical team, the patient should be considered optimized from a cardiovascular standpoint.Discussed in detail with Dr. Elgie Congo who plans to do regional block and MAC to mitigate risk of GA unless absolutely needed.    Elouise Munroe, MD 05/06/20 4:34 PM

## 2020-05-06 NOTE — Anesthesia Procedure Notes (Signed)
Anesthesia Regional Block: Adductor canal block   Pre-Anesthetic Checklist: ,, timeout performed, Correct Patient, Correct Site, Correct Laterality, Correct Procedure, Correct Position, site marked, Risks and benefits discussed,  Surgical consent,  Pre-op evaluation,  At surgeon's request and post-op pain management  Laterality: Left  Prep: chloraprep       Needles:  Injection technique: Single-shot  Needle Type: Echogenic Stimulator Needle          Additional Needles:   Narrative:  Start time: 05/06/2020 12:15 PM End time: 05/06/2020 12:20 PM Injection made incrementally with aspirations every 5 mL.  Performed by: Personally  Anesthesiologist: Heather Roberts, MD  Additional Notes: A functioning IV was confirmed and monitors were applied.  Sterile prep and drape, hand hygiene and sterile gloves were used.  Negative aspiration and test dose prior to incremental administration of local anesthetic. The patient tolerated the procedure well.Ultrasound  guidance: relevant anatomy identified, needle position confirmed, local anesthetic spread visualized around nerve(s), vascular puncture avoided.  Image printed for medical record.

## 2020-05-06 NOTE — Anesthesia Postprocedure Evaluation (Signed)
Anesthesia Post Note  Patient: Denise [name]  Procedure(s) Performed: OPEN REDUCTION INTERNAL FIXATION (ORIF) BIMALLEOLAR LEFT  ANKLE FRACTURE (Left Ankle)     Patient location during evaluation: PACU Anesthesia Type: Regional and MAC Level of consciousness: awake and alert Pain management: pain level controlled Vital Signs Assessment: post-procedure vital signs reviewed and stable Respiratory status: spontaneous breathing and respiratory function stable Cardiovascular status: stable Postop Assessment: no apparent nausea or vomiting Anesthetic complications: no   No complications documented.  Last Vitals:  Vitals:   05/06/20 1515 05/06/20 1530  BP: (!) 113/56 (!) 135/57  Pulse: 63 60  Resp: 13 12  Temp:  36.9 C  SpO2: 98% 95%    Last Pain:  Vitals:   05/06/20 1530  TempSrc:   PainSc: 0-No pain                 Merlinda Frederick

## 2020-05-06 NOTE — Progress Notes (Signed)
Assisted Dr. Bass with left, ultrasound guided, popliteal, adductor canal block. Side rails up, monitors on throughout procedure. See vital signs in flow sheet. Tolerated Procedure well. 

## 2020-05-06 NOTE — Progress Notes (Signed)
ANTICOAGULATION CONSULT NOTE  Pharmacy Consult for xarelto Indication: hx atrial fibrillation  Allergies  Allergen Reactions  . Codeine Other (See Comments)    unknown  . Sulfonamide Derivatives Other (See Comments)    unknown    Patient Measurements: Height: 5\' 1"  (154.9 cm) Weight: 79.8 kg (175 lb 14.8 oz) IBW/kg (Calculated) : 47.8 Heparin Dosing Weight:   Vital Signs: Temp: 97.7 F (36.5 C) (04/19 1549) Temp Source: Oral (04/19 1549) BP: 96/85 (04/19 1549) Pulse Rate: 66 (04/19 1549)  Labs: Recent Labs    05/05/20 0901 05/05/20 1955 05/06/20 0046 05/06/20 1515  HGB 6.6* 8.2* 7.6*  7.7* 8.9*  HCT 24.1* 27.7* 26.2*  26.5* 30.1*  PLT 305  --  243  --   CREATININE 0.57  --  0.60  --     Estimated Creatinine Clearance: 53.7 mL/min (by C-G formula based on SCr of 0.6 mg/dL).   Medications:  - on xarelto 20mg  daily PTA  Assessment: Patient is an 80 y.o F with hx afib on xarelto PTA presented to the ED on 4/18 from NH facility for evaluation s/p unwitnessed fall.  He was found to have left bimalleolar fracture and underwent repair in the OR on 4/19.  Spoke to Dr. 5/18, ok to resume xarelto ~24 hrs after procedure.    Plan:  - resume xarelto 20 mg daily on 4/20 at 5p - monitor for s/s bleeding - pharmacy will sign off for consult, but will follow patient peripherally along with you.  Dion Saucier 05/06/2020,3:52 PM

## 2020-05-06 NOTE — Op Note (Signed)
05/05/2020 - 05/06/2020  PATIENT:  Denise [name]    PRE-OPERATIVE DIAGNOSIS: Left bimalleolar ankle fracture  POST-OPERATIVE DIAGNOSIS:  Same  PROCEDURE:  OPEN REDUCTION INTERNAL FIXATION (ORIF) BIMALLEOLAR LEFT  ANKLE FRACTURE  3 views of the left ankle plus a stress view were taken postoperatively demonstrating anatomic alignment with stable fixation and syndesmotic stability  SURGEON:  Eulas Post, MD  PHYSICIAN ASSISTANT: Janine Ores, PA-C, present and scrubbed throughout the case, critical for completion in a timely fashion, and for retraction, instrumentation, and closure.  ANESTHESIA:   Regional with monitored anesthesia care  ESTIMATED BLOOD LOSS: 75 mL  PREOPERATIVE INDICATIONS:  [name] is a  80 y.o. female with a diagnosis of left bimalleolar ankle fracture ANKLE FRACTURE who elected for surgical management to minimize the risk for malunion and nonunion and post-traumatic arthritis.  Preoperative discussion was had with her power of attorney.  The risks benefits and alternatives were discussed with the patient preoperatively including but not limited to the risks of infection, bleeding, nerve injury, cardiopulmonary complications, the need for revision surgery, the need for hardware removal, among others, and the patient was willing to proceed.  OPERATIVE IMPLANTS: Zimmer anatomic distal fibular locking plate, with a single interfragmentary screw, and 1 4.0 mm cannulated screw with a washer for the medial malleolus.  I had 4 distal locking screws, and 3 proximal cortical screws  OPERATIVE PROCEDURE: The patient was brought to the operating room and placed in the supine position. All bony prominences were padded.  Regional anesthesia was administered. The lower extremity was prepped and draped in the usual sterile fashion.  Tourniquet was not utilized. Time out was performed.   Incision was made over the distal fibula and the fracture was exposed and reduced  anatomically with a clamp. A interfragmentary screw was placed. I then applied a anatomic locking plate and secured it proximally with non-locking screws.  I secured the plate distally with locking screws.  I selected the anatomic plate because it was more stout, and also provided locking strength, given the fact that the patient has severe dementia and is not likely to be compliant with weightbearing instructions.  Bone quality was poor. I used c-arm to confirm satisfactory reduction and fixation.   I then turned my attention to the medial malleolus. Incision was made over the medial malleolus and the fracture exposed and held provisionally with a clamp.  A guidepin was placed for the 4.0 mm cannulated screw and then confirmation of reduction was made with fluoroscopy. I then placed a 45mm screw which had satisfactory fixation.   The syndesmosis was stressed using live fluoroscopy and found to be stable.   The wounds were irrigated, and closed with vicryl with routine closure for the skin. The wounds were injected with local anesthetic. Sterile gauze was applied followed by a posterior splint. She was awakened and returned to the PACU in stable and satisfactory condition. There were no complications.

## 2020-05-06 NOTE — Transfer of Care (Signed)
Immediate Anesthesia Transfer of Care Note  Patient: Denise [name]  Procedure(s) Performed: OPEN REDUCTION INTERNAL FIXATION (ORIF) BIMALLEOLAR LEFT  ANKLE FRACTURE (Left Ankle)  Patient Location: PACU  Anesthesia Type:MAC  Level of Consciousness: sedated and patient cooperative  Airway & Oxygen Therapy: Patient Spontanous Breathing and Patient connected to face mask oxygen  Post-op Assessment: Report given to RN and Post -op Vital signs reviewed and stable  Post vital signs: stable  Last Vitals:  Vitals Value Taken Time  BP 119/54 05/06/20 1445  Temp 37.1 C 05/06/20 1444  Pulse 65 05/06/20 1450  Resp 11 05/06/20 1450  SpO2 96 % 05/06/20 1450  Vitals shown include unvalidated device data.  Last Pain:  Vitals:   05/06/20 1220  TempSrc: Oral  PainSc:          Complications: No complications documented.

## 2020-05-06 NOTE — Discharge Instructions (Signed)
Diet: As you were doing prior to hospitalization   Shower:  May shower but keep the wounds dry, use an occlusive plastic wrap, NO SOAKING IN TUB.  If the bandage gets wet, change with a clean dry gauze.  If you have a splint on, leave the splint in place and keep the splint dry with a plastic bag.  Dressing:  You may change your dressing 3-5 days after surgery, unless you have a splint.  If you have a splint, then just leave the splint in place and we will change your bandages during your first follow-up appointment.    If you had hand or foot surgery, we will plan to remove your stitches in about 2 weeks in the office.  For all other surgeries, there are sticky tapes (steri-strips) on your wounds and all the stitches are absorbable.  Leave the steri-strips in place when changing your dressings, they will peel off with time, usually 2-3 weeks.  Activity:  Increase activity slowly as tolerated, but follow the weight bearing instructions below.  The rules on driving is that you can not be taking narcotics while you drive, and you must feel in control of the vehicle.    Weight Bearing:   Do not bear weight on left leg for at least 6 weeks after surgery  To prevent constipation: you may use a stool softener such as -  Colace (over the counter) 100 mg by mouth twice a day  Drink plenty of fluids (prune juice may be helpful) and high fiber foods Miralax (over the counter) for constipation as needed.    Itching:  If you experience itching with your medications, try taking only a single pain pill, or even half a pain pill at a time.  You may take up to 10 pain pills per day, and you can also use benadryl over the counter for itching or also to help with sleep.   Precautions:  If you experience chest pain or shortness of breath - call 911 immediately for transfer to the hospital emergency department!!  If you develop a fever greater that 101 F, purulent drainage from wound, increased redness or drainage  from wound, or calf pain -- Call the office at [phone]                                                Follow- Up Appointment:  Please call for an appointment to be seen in 2 weeks [place] - [phone]

## 2020-05-06 NOTE — Anesthesia Procedure Notes (Signed)
Anesthesia Regional Block: Popliteal block   Pre-Anesthetic Checklist: ,, timeout performed, Correct Patient, Correct Site, Correct Laterality, Correct Procedure, Correct Position, site marked, Risks and benefits discussed,  Surgical consent,  Pre-op evaluation,  At surgeon's request and post-op pain management  Laterality: Left  Prep: chloraprep       Needles:  Injection technique: Single-shot  Needle Type: Echogenic Stimulator Needle          Additional Needles:   Narrative:  Start time: 05/06/2020 12:10 PM End time: 05/06/2020 12:15 PM Injection made incrementally with aspirations every 5 mL.  Performed by: Personally  Anesthesiologist: Mellody Dance, MD  Additional Notes: A functioning IV was confirmed and monitors were applied.  Sterile prep and drape, hand hygiene and sterile gloves were used.  Negative aspiration and test dose prior to incremental administration of local anesthetic. The patient tolerated the procedure well.Ultrasound  guidance: relevant anatomy identified, needle position confirmed, local anesthetic spread visualized around nerve(s), vascular puncture avoided.  Image printed for medical record.

## 2020-05-06 NOTE — Progress Notes (Signed)
Called and spoke with Marie's husband, co-guardian, and also left vm for Hilda Lias updating her on the status.  Awaiting blood transfusion completion for medical optimization in preparation for ORIF.  Eulas Post, MD

## 2020-05-06 NOTE — Anesthesia Procedure Notes (Signed)
Procedure Name: MAC Date/Time: 05/06/2020 12:37 PM Performed by: Lissa Morales, CRNA Pre-anesthesia Checklist: Patient identified, Emergency Drugs available, Suction available and Patient being monitored Patient Re-evaluated:Patient Re-evaluated prior to induction Oxygen Delivery Method: Simple face mask Placement Confirmation: positive ETCO2

## 2020-05-06 NOTE — Progress Notes (Signed)
PROGRESS NOTE    Denise [name]  OBS:[id number] DOB: Oct 01, 1940 DOA: 05/05/2020 PCP: Jearld Fenton, NP     Brief Narrative:  Denise [name] is a 80 y.o. female with medical history significant of dementia, afib on anticoagulation, COPD, DM2 who presented with leg pain after unwitnessed fall. Patient is poor historian due to underlying dementia. Apparently during 3rd shift last night at [facility] facility, it was noted that the patient had fallen and was having difficulty getting up. EMS was called for transfer to ED. She was found to have a left ankle fracture. Orthopedics was consulted.   New events last 24 hours / Subjective: Patient is disoriented, seems to be at her baseline with her dementia.  She is not oriented to place year or situation.  She denies any chest pain, shortness of breath, worsening pain in her ankle.  Denies dysuria.  She has hand mittens in place, attempting to bite her IV line.   Assessment & Plan:   Principal Problem:   Ankle fracture, left Active Problems:   Pressure injury of skin   Left ankle fracture after unwitnessed fall -Orthopedic surgery following, planning for ORIF pending cardiology evaluation  Unwitnessed fall -Unclear if syncopal episode versus mechanical fall -Echocardiogram did reveal severe aortic stenosis -Also could be compounded by symptomatic anemia  Severe aortic stenosis -Per echocardiogram -Cardiology consulted  Iron deficiency anemia, microcytic anemia -Transfuse 1 unit packed red blood cell 4/18, transfuse additional unit today prior to pending surgical procedure in setting of aortic stenosis -Transfuse IV iron -Xarelto on hold  Abnormal UA -Patient without complaints of dysuria, although she does have underlying dementia -Urine culture pending  Leukocytosis -Unclear if stress response versus infectious etiology -Urine culture is pending  Diabetes mellitus -Hemoglobin A1c 6.5 -Sliding scale  insulin  Paroxysmal A. fib -In normal sinus rhythm this morning -Hold Xarelto pending surgical procedure  Dementia -Continue Aricept, Namenda -Resides at memory care facility  Hyperlipidemia -Continue Lipitor  Hypertension -Continue ramipril  Depression/anxiety -Continue Zoloft   In agreement with assessment of the pressure ulcer as below:  Pressure Injury 05/05/20 Buttocks Right Stage 1 -  Intact skin with non-blanchable redness of a localized area usually over a bony prominence. (Active)  05/05/20 1100  Location: Buttocks  Location Orientation: Right  Staging: Stage 1 -  Intact skin with non-blanchable redness of a localized area usually over a bony prominence.  Wound Description (Comments):   Present on Admission: Yes         DVT prophylaxis:  Sequential compression device to OR Start: 05/05/20 1949 SCDs Start: 05/05/20 1131  Code Status: Full Family Communication: None at bedside Disposition Plan:  Status is: Inpatient  Remains inpatient appropriate because:Ongoing diagnostic testing needed not appropriate for outpatient work up, IV treatments appropriate due to intensity of illness or inability to take PO and Inpatient level of care appropriate due to severity of illness   Dispo: The patient is from: Memory care              Anticipated d/c is to: Same              Patient currently is not medically stable to d/c.   Difficult to place patient No      Consultants:   Orthopedic surgery  Cardiology   Antimicrobials:  Anti-infectives (From admission, onward)   Start     Dose/Rate Route Frequency Ordered Stop   05/06/20 0600  ceFAZolin (ANCEF) IVPB 2g/100 mL premix  2 g 200 mL/hr over 30 Minutes Intravenous On call to O.R. 05/05/20 1948 05/07/20 0559        Objective: Vitals:   05/05/20 2136 05/06/20 0639 05/06/20 0800 05/06/20 0815  BP: (!) 123/52 (!) 149/53 (!) 138/53 (!) 137/43  Pulse: 72 77 70 71  Resp: _0 Temp: 99.3  F (37.4 C) 98 F (36.7 C) 98.5 F (36.9 C) 98.4 F (36.9 C)  TempSrc: Oral  Axillary Axillary  SpO2: 92% 96%  98%  Weight:      Height:        Intake/Output Summary (Last 24 hours) at 05/06/2020 1032 Last data filed at 05/06/2020 0818 Gross per 24 hour  Intake 1575.98 ml  Output 175 ml  Net 1400.98 ml   Filed Weights   05/05/20 1509  Weight: 79.8 kg    Examination:  General exam: Appears calm and comfortable  Respiratory system: Clear to auscultation. Respiratory effort normal. No respiratory distress. No conversational dyspnea.  Cardiovascular system: S1 & S2 heard, RRR. +murmur.  Gastrointestinal system: Abdomen is nondistended, soft and nontender. Normal bowel sounds heard. Central nervous system: Alert but not oriented at all Extremities: Left ankle wrapped Psychiatry: Pleasantly confused  Data Reviewed: I have personally reviewed following labs and imaging studies  CBC: Recent Labs  Lab 05/05/20 0901 05/05/20 1955 05/06/20 0046  WBC 13.1*  --  12.0*  NEUTROABS 11.6*  --   --   HGB 6.6* 8.2* 7.6*  7.7*  HCT 24.1* 27.7* 26.2*  26.5*  MCV 67.9*  --  72.0*  PLT 305  --  220   Basic Metabolic Panel: Recent Labs  Lab 05/05/20 0901 05/06/20 0046  NA 139 139  K 4.0 3.5  CL 107 112*  CO2 22 19*  GLUCOSE 198* 124*  BUN 15 10  CREATININE 0.57 0.60  CALCIUM 8.9 8.4*   GFR: Estimated Creatinine Clearance: 53.7 mL/min (by C-G formula based on SCr of 0.6 mg/dL). Liver Function Tests: Recent Labs  Lab 05/06/20 0046  AST 19  ALT 14  ALKPHOS 41  BILITOT 1.7*  PROT 5.7*  ALBUMIN 3.2*   No results for input(s): LIPASE, AMYLASE in the last 168 hours. No results for input(s): AMMONIA in the last 168 hours. Coagulation Profile: No results for input(s): INR, PROTIME in the last 168 hours. Cardiac Enzymes: No results for input(s): CKTOTAL, CKMB, CKMBINDEX, TROPONINI in the last 168 hours. BNP (last 3 results) No results for input(s): PROBNP in the last  8760 hours. HbA1C: Recent Labs    05/05/20 1409  HGBA1C 6.5*   CBG: Recent Labs  Lab 05/05/20 0732 05/05/20 1208 05/05/20 1638 05/05/20 2153 05/06/20 0744  GLUCAP 212* 162* 151* 130* 181*   Lipid Profile: No results for input(s): CHOL, HDL, LDLCALC, TRIG, CHOLHDL, LDLDIRECT in the last 72 hours. Thyroid Function Tests: No results for input(s): TSH, T4TOTAL, FREET4, T3FREE, THYROIDAB in the last 72 hours. Anemia Panel: Recent Labs    05/05/20 1131  VITAMINB12 657  FOLATE 43.5  TIBC 443  IRON 7*   Sepsis Labs: No results for input(s): PROCALCITON, LATICACIDVEN in the last 168 hours.  Recent Results (from the past 240 hour(s))  Resp Panel by RT-PCR (Flu A&B, Covid) Nasopharyngeal Swab     Status: None   Collection Time: 05/05/20  9:01 AM   Specimen: Nasopharyngeal Swab; Nasopharyngeal(NP) swabs in vial transport medium  Result Value Ref Range Status   SARS Coronavirus 2 by RT PCR NEGATIVE NEGATIVE Final  Comment: (NOTE) SARS-CoV-2 target nucleic acids are NOT DETECTED.  The SARS-CoV-2 RNA is generally detectable in upper respiratory specimens during the acute phase of infection. The lowest concentration of SARS-CoV-2 viral copies this assay can detect is 138 copies/mL. A negative result does not preclude SARS-Cov-2 infection and should not be used as the sole basis for treatment or other patient management decisions. A negative result may occur with  improper specimen collection/handling, submission of specimen other than nasopharyngeal swab, presence of viral mutation(s) within the areas targeted by this assay, and inadequate number of viral copies(<138 copies/mL). A negative result must be combined with clinical observations, patient history, and epidemiological information. The expected result is Negative.  Fact Sheet for Patients:  EntrepreneurPulse.com.au  Fact Sheet for Healthcare Providers:   IncredibleEmployment.be  This test is no t yet approved or cleared by the [place] FDA and  has been authorized for detection and/or diagnosis of SARS-CoV-2 by FDA under an Emergency Use Authorization (EUA). This EUA will remain  in effect (meaning this test can be used) for the duration of the COVID-19 declaration under Section 564(b)(1) of the Act, 21 U.S.C.section 360bbb-3(b)(1), unless the authorization is terminated  or revoked sooner.       Influenza A by PCR NEGATIVE NEGATIVE Final   Influenza B by PCR NEGATIVE NEGATIVE Final    Comment: (NOTE) The Xpert Xpress SARS-CoV-2/FLU/RSV plus assay is intended as an aid in the diagnosis of influenza from Nasopharyngeal swab specimens and should not be used as a sole basis for treatment. Nasal washings and aspirates are unacceptable for Xpert Xpress SARS-CoV-2/FLU/RSV testing.  Fact Sheet for Patients: EntrepreneurPulse.com.au  Fact Sheet for Healthcare Providers: IncredibleEmployment.be  This test is not yet approved or cleared by the [place] FDA and has been authorized for detection and/or diagnosis of SARS-CoV-2 by FDA under an Emergency Use Authorization (EUA). This EUA will remain in effect (meaning this test can be used) for the duration of the COVID-19 declaration under Section 564(b)(1) of the Act, 21 U.S.C. section 360bbb-3(b)(1), unless the authorization is terminated or revoked.  Performed at [facility], [place] [address] [place], [place] [address]   MRSA PCR Screening     Status: None   Collection Time: 05/05/20  3:23 PM   Specimen: Nasal Mucosa; Nasopharyngeal  Result Value Ref Range Status   MRSA by PCR NEGATIVE NEGATIVE Final    Comment:        The GeneXpert MRSA Assay (FDA approved for NASAL specimens only), is one component of a comprehensive MRSA colonization surveillance program. It is not intended to diagnose  MRSA infection nor to guide or monitor treatment for MRSA infections. Performed at [facility], [place] [address] [place], [place] [address]   Surgical pcr screen     Status: Abnormal   Collection Time: 05/05/20  3:23 PM   Specimen: Nasal Mucosa; Nasal Swab  Result Value Ref Range Status   MRSA, PCR NEGATIVE NEGATIVE Final   Staphylococcus aureus POSITIVE (A) NEGATIVE Final    Comment: (NOTE) The Xpert SA Assay (FDA approved for NASAL specimens in patients 40 years of age and older), is one component of a comprehensive surveillance program. It is not intended to diagnose infection nor to guide or monitor treatment. Performed at [facility], [place] [address], [place],  [address]       Radiology Studies: DG Pelvis 1-2 Views  Result Date: 05/05/2020 CLINICAL DATA:  [place]. EXAM: PELVIS - 1-2 VIEW COMPARISON:  03/03/2014 FINDINGS: Both hips are normally located. Stable appearing bilateral hip joint degenerative changes but no definite acute hip fracture. The pubic symphysis and SI joints are intact. Remote healed right pubic rami fractures are noted. No definite acute pelvic fractures. IMPRESSION: 1. No acute bony findings. 2. Remote healed right pubic rami fractures. Electronically Signed   By: Marijo Sanes M.D.   On: 05/05/2020 08:30   DG Ankle Complete Left  Result Date: 05/05/2020 CLINICAL DATA:  [place].  Injured left ankle. EXAM: LEFT ANKLE COMPLETE - 3+ VIEW COMPARISON:  None. FINDINGS: Bimalleolar ankle fractures are noted. There is an oblique minimally displaced fracture involving the lateral malleolus at and above the level of the ankle mortise. There is also a mildly displaced transverse fracture through the base of the medial malleolus at the level of the ankle mortise. No fracture of the posterior malleolus of tibia. The talus is intact. Dense calcification noted posteriorly likely in the Achilles tendon possibly related to a chronic tear.  Fairly extensive small vessel calcifications. IMPRESSION: Bimalleolar ankle fractures. Electronically Signed   By: Marijo Sanes M.D.   On: 05/05/2020 08:28   CT Head Wo Contrast  Result Date: 05/05/2020 CLINICAL DATA:  Facial trauma.  Neck trauma.  Unwitnessed fall. EXAM: CT HEAD WITHOUT CONTRAST CT CERVICAL SPINE WITHOUT CONTRAST TECHNIQUE: Multidetector CT imaging of the head and cervical spine was performed following the standard protocol without intravenous contrast. Multiplanar CT image reconstructions of the cervical spine were also generated. COMPARISON:  Head CT 02/09/2015. CT of the cervical spine 03/04/2014. FINDINGS: CT HEAD FINDINGS Brain: Mildly motion degraded exam. Moderate to moderately advanced cerebral atrophy. Comparatively mild cerebellar atrophy. Mild ill-defined and patchy hypoattenuation within the cerebral white matter is nonspecific, but compatible with chronic small vessel ischemic disease. There is no acute intracranial hemorrhage. No demarcated cortical infarct. No extra-axial fluid collection. No evidence of intracranial mass. No midline shift. Partially empty sella turcica. Vascular: No hyperdense vessel.  Atherosclerotic calcifications Skull: Normal. Negative for fracture or focal lesion. Sinuses/Orbits: Visualized orbits show no acute finding. Mild bilateral ethmoid sinus mucosal thickening. Other: Nonspecific 9 mm polypoid soft tissue focus within the posterior right nasopharynx (series 2, image 5) (series 3, image 8). Trace fluid within the left mastoid air cells. CT CERVICAL SPINE FINDINGS Mildly motion degraded exam. Alignment: Cervical levocurvature. 2 mm C3-C4 grade 1 retrolisthesis. Trace C7-T1 grade 1 anterolisthesis. Skull base and vertebrae: The basion-dental and atlanto-dental intervals are maintained.No evidence of acute fracture to the cervical spine. Soft tissues and spinal canal: No prevertebral fluid or swelling. No visible canal hematoma. Disc levels: Cervical  spondylosis with multilevel disc space narrowing, disc bulges, posterior disc osteophytes, endplate spurring, uncovertebral hypertrophy and facet arthrosis. There is fusion across the C5-C6 disc space as well as facet joint ankylosis on the right at this level. Otherwise, disc space narrowing is greatest at C6-C7 (moderate/advanced). Multilevel spinal canal stenosis. Most notably, calcified disc bulges contribute to moderate/severe spinal canal stenosis at C3-C4 and C4-C5. Multilevel bony neural foraminal narrowing. Degenerative changes are also present at the C1-C2 articulation. Upper chest: No consolidation within the imaged lung apices. Interlobular septal thickening within the imaged lung apices, nonspecific but possibly reflecting edema. Other: Right thyroid lobe nodule measuring at least 2.1 cm. IMPRESSION: CT head: 1. Mildly motion degraded examination. 2. No evidence of acute intracranial abnormality. 3. Moderate to moderately advanced cerebral atrophy with comparatively mild cerebellar atrophy, progressed as compared to the head CT of 02/09/2015. 4. Mild cerebral white  matter chronic small vessel ischemic disease. 5. Nonspecific 9 mm polypoid soft tissue focus within the posterior right nasopharynx. Nonemergent ENT consultation recommended. CT cervical spine: 1. Mildly motion degraded examination. 2. No evidence of acute fracture to the cervical spine. 3. Cervical levocurvature. 4. 2 mm C3-C4 grade 1 retrolisthesis. 5. Trace C7-T1 grade 1 anterolisthesis. 6. Cervical spondylosis as outlined with degenerative appearing fusion at C5-C6. Multilevel spinal canal stenosis. Most notably, there is apparent moderate/severe spinal canal stenosis at C3-C4 and C4-C5. Multilevel bony neural foraminal narrowing. 7. Right thyroid lobe nodule measuring at least 2.1 cm. Nonemergent thyroid ultrasound recommended for further evaluation. Electronically Signed   By: Kellie Simmering DO   On: 05/05/2020 08:57   CT Cervical Spine  Wo Contrast  Result Date: 05/05/2020 CLINICAL DATA:  Facial trauma.  Neck trauma.  Unwitnessed fall. EXAM: CT HEAD WITHOUT CONTRAST CT CERVICAL SPINE WITHOUT CONTRAST TECHNIQUE: Multidetector CT imaging of the head and cervical spine was performed following the standard protocol without intravenous contrast. Multiplanar CT image reconstructions of the cervical spine were also generated. COMPARISON:  Head CT 02/09/2015. CT of the cervical spine 03/04/2014. FINDINGS: CT HEAD FINDINGS Brain: Mildly motion degraded exam. Moderate to moderately advanced cerebral atrophy. Comparatively mild cerebellar atrophy. Mild ill-defined and patchy hypoattenuation within the cerebral white matter is nonspecific, but compatible with chronic small vessel ischemic disease. There is no acute intracranial hemorrhage. No demarcated cortical infarct. No extra-axial fluid collection. No evidence of intracranial mass. No midline shift. Partially empty sella turcica. Vascular: No hyperdense vessel.  Atherosclerotic calcifications Skull: Normal. Negative for fracture or focal lesion. Sinuses/Orbits: Visualized orbits show no acute finding. Mild bilateral ethmoid sinus mucosal thickening. Other: Nonspecific 9 mm polypoid soft tissue focus within the posterior right nasopharynx (series 2, image 5) (series 3, image 8). Trace fluid within the left mastoid air cells. CT CERVICAL SPINE FINDINGS Mildly motion degraded exam. Alignment: Cervical levocurvature. 2 mm C3-C4 grade 1 retrolisthesis. Trace C7-T1 grade 1 anterolisthesis. Skull base and vertebrae: The basion-dental and atlanto-dental intervals are maintained.No evidence of acute fracture to the cervical spine. Soft tissues and spinal canal: No prevertebral fluid or swelling. No visible canal hematoma. Disc levels: Cervical spondylosis with multilevel disc space narrowing, disc bulges, posterior disc osteophytes, endplate spurring, uncovertebral hypertrophy and facet arthrosis. There is  fusion across the C5-C6 disc space as well as facet joint ankylosis on the right at this level. Otherwise, disc space narrowing is greatest at C6-C7 (moderate/advanced). Multilevel spinal canal stenosis. Most notably, calcified disc bulges contribute to moderate/severe spinal canal stenosis at C3-C4 and C4-C5. Multilevel bony neural foraminal narrowing. Degenerative changes are also present at the C1-C2 articulation. Upper chest: No consolidation within the imaged lung apices. Interlobular septal thickening within the imaged lung apices, nonspecific but possibly reflecting edema. Other: Right thyroid lobe nodule measuring at least 2.1 cm. IMPRESSION: CT head: 1. Mildly motion degraded examination. 2. No evidence of acute intracranial abnormality. 3. Moderate to moderately advanced cerebral atrophy with comparatively mild cerebellar atrophy, progressed as compared to the head CT of 02/09/2015. 4. Mild cerebral white matter chronic small vessel ischemic disease. 5. Nonspecific 9 mm polypoid soft tissue focus within the posterior right nasopharynx. Nonemergent ENT consultation recommended. CT cervical spine: 1. Mildly motion degraded examination. 2. No evidence of acute fracture to the cervical spine. 3. Cervical levocurvature. 4. 2 mm C3-C4 grade 1 retrolisthesis. 5. Trace C7-T1 grade 1 anterolisthesis. 6. Cervical spondylosis as outlined with degenerative appearing fusion at C5-C6. Multilevel spinal canal stenosis.  Most notably, there is apparent moderate/severe spinal canal stenosis at C3-C4 and C4-C5. Multilevel bony neural foraminal narrowing. 7. Right thyroid lobe nodule measuring at least 2.1 cm. Nonemergent thyroid ultrasound recommended for further evaluation. Electronically Signed   By: Kellie Simmering DO   On: 05/05/2020 08:57   DG Knee Complete 4 Views Left  Result Date: 05/05/2020 CLINICAL DATA:  [place]. EXAM: LEFT KNEE - COMPLETE 4+ VIEW COMPARISON:  None. FINDINGS: Moderate age related degenerative  changes but no acute fracture or osteochondral lesion. Chondrocalcinosis is noted. Small knee joint effusion. Vascular calcifications noted. Remote healed proximal fibular fractures noted. IMPRESSION: 1. No acute bony findings. 2. Small knee joint effusion. 3. 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 Electronically Signed   By: Marijo Sanes M.D.   On: 05/05/2020 08:31   ECHOCARDIOGRAM COMPLETE  Result Date: 05/05/2020    ECHOCARDIOGRAM REPORT   Patient Name:   ADRIYANNA CHRISTIANS Date of Exam: 05/05/2020 Medical Rec #:  [id number]       Height:       63.0 in Accession #:    [id number]      Weight:       196.0 lb Date of Birth:  [birth year]       BSA:          1.917 m Patient Age:    80 years        BP:           123/51 mmHg Patient Gender: F               HR:           69 bpm. Exam Location:  Inpatient Procedure: 2D Echo, 3D Echo, Cardiac Doppler and Color Doppler Indications:    R55 Syncope  History:        Patient has no prior history of Echocardiogram examinations.                 Abnormal ECG, Arrythmias:Atrial Fibrillation,                 Signs/Symptoms:Alzheimer's and Altered Mental Status; Risk                 Factors:Hypertension, Diabetes and Dyslipidemia.  Sonographer:    Roseanna Rainbow RDCS Referring Phys: [id number] Jonnie Finner  Sonographer Comments: Technically difficult study due to poor echo windows. Image acquisition challenging due to uncooperative patient. Patient supine and pushed my hand away at the end of study. IMPRESSIONS  1. There is severe aortic stenosis with AVA 0.8 by continuity equation, mean gradient 58mHg, peak gradient 717mg, Vmax 4.1715m DI 0.27.  2. There is mild calcific mitral stenosis with mean gradient 8mm32mat HR 74bpm. MVA by continuity equation is 1.2cm2  3. Left ventricular ejection fraction, by estimation, is 60 to 65%. The left ventricle has normal function. The left ventricle has no regional wall motion abnormalities. There is moderate concentric left  ventricular hypertrophy. Diastolic function indeterminant due to severe MAC. Elevated left atrial pressure.  4. Right ventricular systolic function is normal. The right ventricular size is normal. Tricuspid regurgitation signal is inadequate for assessing PA pressure.  5. Left atrial size was mildly dilated.  6. The mitral valve is abnormal. Trivial mitral valve regurgitation. Severe mitral annular calcification.  7. The aortic valve is calcified. There is severe calcifcation of the aortic valve. There is severe thickening of the aortic valve. Aortic valve regurgitation is trivial. Severe aortic valve stenosis. Comparison(s): No prior Echocardiogram.  FINDINGS  Left Ventricle: Left ventricular ejection fraction, by estimation, is 60 to 65%. The left ventricle has normal function. The left ventricle has no regional wall motion abnormalities. The left ventricular internal cavity size was normal in size. There is  moderate concentric left ventricular hypertrophy. Diastolic function indeterminant due to severe MAC. Elevated left atrial pressure. The E/e' is 77. Right Ventricle: The right ventricular size is normal. No increase in right ventricular wall thickness. Right ventricular systolic function is normal. Tricuspid regurgitation signal is inadequate for assessing PA pressure. Left Atrium: Left atrial size was mildly dilated. Right Atrium: Right atrial size was normal in size. Pericardium: There is no evidence of pericardial effusion. Mitral Valve: There is mild calcific mitral stenosis with mean gradient 53mHg at HR 74bpm. MVA by continuity equation is 1.2cm2. The mitral valve is abnormal. There is moderate thickening of the mitral valve leaflet(s). There is moderate calcification of  the mitral valve leaflet(s). Severe mitral annular calcification. Trivial mitral valve regurgitation. MV peak gradient, 25.5 mmHg. The mean mitral valve gradient is 8.0 mmHg. Tricuspid Valve: The tricuspid valve is normal in structure.  Tricuspid valve regurgitation is trivial. Aortic Valve: There is severe aortic stenosis with AVA 0.8 by continuity equation, mean gradient 462mg, peak gradient 7083m, Vmax 4.32m2mDI 0.27. The aortic valve is calcified. There is severe calcifcation of the aortic valve. There is severe thickening of the aortic valve. Aortic valve regurgitation is trivial. Severe aortic stenosis is present. Aortic valve mean gradient measures 42.2 mmHg. Aortic valve peak gradient measures 69.6 mmHg. Aortic valve area, by VTI measures 1.29 cm. Pulmonic Valve: The pulmonic valve was not well visualized. Pulmonic valve regurgitation is trivial. Aorta: The aortic root is normal in size and structure. IAS/Shunts: No atrial level shunt detected by color flow Doppler.  LEFT VENTRICLE PLAX 2D LVIDd:         4.50 cm     Diastology LVIDs:         3.00 cm     LV e' medial:    6.20 cm/s LV PW:         1.60 cm     LV E/e' medial:  26.9 LV IVS:        1.30 cm     LV e' lateral:   5.44 cm/s LVOT diam:     1.90 cm     LV E/e' lateral: 30.7 LV SV:         130 LV SV Index:   68 LVOT Area:     2.84 cm  LV Volumes (MOD) LV vol d, MOD A2C: 58.8 ml LV vol d, MOD A4C: 67.8 ml LV vol s, MOD A2C: 29.8 ml LV vol s, MOD A4C: 21.2 ml LV SV MOD A2C:     29.0 ml LV SV MOD A4C:     67.8 ml LV SV MOD BP:      38.2 ml RIGHT VENTRICLE            IVC RV S prime:     9.46 cm/s  IVC diam: 2.10 cm TAPSE (M-mode): 2.2 cm LEFT ATRIUM             Index       RIGHT ATRIUM           Index LA diam:        4.00 cm 2.09 cm/m  RA Area:     11.00 cm LA Vol (A2C):   41.2 ml 21.49 ml/m RA Volume:  21.10 ml  11.01 ml/m LA Vol (A4C):   47.4 ml 24.72 ml/m LA Biplane Vol: 45.0 ml 23.47 ml/m  AORTIC VALVE AV Area (Vmax):    1.42 cm AV Area (Vmean):   1.30 cm AV Area (VTI):     1.29 cm AV Vmax:           417.25 cm/s AV Vmean:          306.500 cm/s AV VTI:            1.002 m AV Peak Grad:      69.6 mmHg AV Mean Grad:      42.2 mmHg LVOT Vmax:         209.00 cm/s LVOT Vmean:         141.000 cm/s LVOT VTI:          0.457 m LVOT/AV VTI ratio: 0.46  AORTA Ao Root diam: 2.60 cm MITRAL VALVE MV Area (PHT): 4.39 cm     SHUNTS MV Area VTI:   1.89 cm     Systemic VTI:  0.46 m MV Peak grad:  25.5 mmHg    Systemic Diam: 1.90 cm MV Mean grad:  8.0 mmHg MV Vmax:       2.53 m/s MV Vmean:      120.5 cm/s MV Decel Time: 173 msec MV E velocity: 167.00 cm/s MV A velocity: 214.00 cm/s MV E/A ratio:  0.78 Gwyndolyn Kaufman MD Electronically signed by Gwyndolyn Kaufman MD Signature Date/Time: 05/05/2020/4:36:45 PM    Final       Scheduled Meds: . acetaminophen  1,000 mg Oral On Call to OR  . atorvastatin  10 mg Oral QHS  . Chlorhexidine Gluconate Cloth  6 each Topical Daily  . donepezil  10 mg Oral QHS  . insulin aspart  0-9 Units Subcutaneous TID WC  . memantine  10 mg Oral BID  . mupirocin ointment  1 application Nasal BID  . povidone-iodine  2 application Topical Once  . ramipril  10 mg Oral Daily  . sertraline  50 mg Oral Daily  . sodium chloride flush  3 mL Intravenous Q12H   Continuous Infusions: . sodium chloride Stopped (05/05/20 1015)  .  ceFAZolin (ANCEF) IV       LOS: 1 day      Time spent: 35 minutes   Dessa Phi, DO [facility] 05/06/2020, 10:32 AM   Available via Epic secure chat 7am-7pm After these hours, please refer to coverage provider listed on [email or web address]

## 2020-05-06 NOTE — Anesthesia Preprocedure Evaluation (Addendum)
Anesthesia Evaluation  Patient identified by MRN, date of birth, ID band Patient confused    Reviewed: Allergy & Precautions, NPO status , Patient's Chart, lab work & pertinent test results  Airway Mallampati: II  TM Distance: >3 FB Neck ROM: Full    Dental no notable dental hx.    Pulmonary asthma , COPD,    Pulmonary exam normal breath sounds clear to auscultation       Cardiovascular hypertension, Normal cardiovascular exam+ dysrhythmias (Paroxysmal afib) Atrial Fibrillation  Rhythm:Irregular Rate:Normal  Sinus rhythm Ventricular premature complex Prolonged PR interval Borderline T wave abnormalities Borderline prolonged QT interval Confirmed by Lacretia Leigh ([id number]) on 05/05/2020 9:13:07 AM  1. There is severe aortic stenosis with AVA 0.8 by continuity equation,  mean gradient 110mHg, peak gradient 735mg, Vmax 4.1765m DI 0.27.  2. There is mild calcific mitral stenosis with mean gradient 8mm62mat HR  74bpm. MVA by continuity equation is 1.2cm2  3. Left ventricular ejection fraction, by estimation, is 60 to 65%. The  left ventricle has normal function. The left ventricle has no regional  wall motion abnormalities. There is moderate concentric left ventricular  hypertrophy. Diastolic function  indeterminant due to severe MAC. Elevated left atrial pressure.  4. Right ventricular systolic function is normal. The right ventricular  size is normal. Tricuspid regurgitation signal is inadequate for assessing  PA pressure.  5. Left atrial size was mildly dilated.  6. The mitral valve is abnormal. Trivial mitral valve regurgitation.  Severe mitral annular calcification.  7. The aortic valve is calcified. There is severe calcifcation of the  aortic valve. There is severe thickening of the aortic valve. Aortic valve  regurgitation is trivial. Severe aortic valve stenosis.    Neuro/Psych PSYCHIATRIC DISORDERS Dementia     GI/Hepatic Neg liver ROS, GERD  ,  Endo/Other  diabetes, Poorly Controlled, Type 2  Renal/GU negative Renal ROS  negative genitourinary   Musculoskeletal  (+) Arthritis , Osteoarthritis,    Abdominal   Peds  Hematology  (+) anemia ,   Anesthesia Other Findings   Reproductive/Obstetrics negative OB ROS                          Anesthesia Physical Anesthesia Plan  ASA: IV  Anesthesia Plan: MAC and Regional   Post-op Pain Management:  Regional for Post-op pain   Induction: Intravenous  PONV Risk Score and Plan: 2 and Propofol infusion and Treatment may vary due to age or medical condition  Airway Management Planned: Natural Airway and Simple Face Mask  Additional Equipment: None  Intra-op Plan:   Post-operative Plan: Extubation in OR  Informed Consent: I have reviewed the patients History and Physical, chart, labs and discussed the procedure including the risks, benefits and alternatives for the proposed anesthesia with the patient or authorized representative who has indicated his/her understanding and acceptance.     Consent reviewed with POA  Plan Discussed with:   Anesthesia Plan Comments: (I discussed this patient with the Hospitalist Dr. JennDessa Phie Patient has received 1uPRBC since admission. Needs a second unit prior to OR given significant anemia and severe AS found on ECHO. Will plan for block (Pop/Saph) + sedation (propofol gtt). Phenylephrine gtt. GA/LMA as backup plan. CandNorton Blizzard   Discussed patient with her legal guardians JameJeneen Rinks MariChancy Milroyey are in agreement with the plan. CandNorton Blizzard   )     Anesthesia Quick Evaluation

## 2020-05-06 NOTE — TOC Initial Note (Signed)
Transition of Care [facility]) - Initial/Assessment Note    Patient Details  Name: Denise [name] MRN: [id number] Date of Birth: May 25, 1940  Transition of Care [facility]) CM/SW Contact:    Lanier Clam, RN Phone Number: 05/06/2020, 1:34 PM  Clinical Narrative: From [facility]-rep Denise [name] can return. Await post L ankle sx, & PT cons prior fl2. D/c plan to return.                  Expected Discharge Plan: Memory Care Barriers to Discharge: Continued Medical Work up   Patient Goals and CMS Choice Patient states their goals for this hospitalization and ongoing recovery are:: return back to [facility] memory care CMS Medicare.gov Compare Post Acute Care list provided to:: Legal Guardian (co guardian Denise [name] [phone]) Choice offered to / list presented to : [facility] POA / Guardian  Expected Discharge Plan and Services Expected Discharge Plan: Memory Care   Discharge Planning Services: CM Consult Post Acute Care Choice:  (memory care) Living arrangements for the past 2 months:  (memory care)                                      Prior Living Arrangements/Services Living arrangements for the past 2 months:  (memory care) Lives with:: Other (Comment) (memory care) Patient language and need for interpreter reviewed:: Yes Do you feel safe going back to the place where you live?: Yes      Need for Family Participation in Patient Care: No (Comment) Care giver support system in place?: Yes (comment)   Criminal Activity/Legal Involvement Pertinent to Current Situation/Hospitalization: No - Comment as needed  Activities of Daily Living Home Assistive Devices/Equipment: Blood pressure cuff,CBG Meter,Eyeglasses,Grab bars around toilet,Grab bars in shower,Hand-held shower hose,Hospital bed,Scales,Wheelchair,Walker (specify type) ADL Screening (condition at time of admission) Patient's cognitive ability adequate to safely complete daily activities?: No Is the patient deaf or have  difficulty hearing?: No Does the patient have difficulty seeing, even when wearing glasses/contacts?: No Does the patient have difficulty concentrating, remembering, or making decisions?: Yes Patient able to express need for assistance with ADLs?: No Does the patient have difficulty dressing or bathing?: Yes Independently performs ADLs?: No Communication: Independent Dressing (OT): Needs assistance Is this a change from baseline?: Pre-admission baseline Grooming: Needs assistance Is this a change from baseline?: Pre-admission baseline Feeding: Needs assistance Is this a change from baseline?: Pre-admission baseline Bathing: Needs assistance Is this a change from baseline?: Pre-admission baseline Toileting: Dependent Is this a change from baseline?: Change from baseline, expected to last >3days In/Out Bed: Dependent Is this a change from baseline?: Change from baseline, expected to last >3 days Walks in Home: Dependent Is this a change from baseline?: Change from baseline, expected to last >3 days Does the patient have difficulty walking or climbing stairs?: Yes (secondary to weakness and fractured left ankle) Weakness of Legs: Left Weakness of Arms/Hands: None  Permission Sought/Granted Permission sought to share information with : Case Manager Permission granted to share information with : Yes, Verbal Permission Granted  Share Information with NAME: Case Manager     Permission granted to share info w Relationship: Denise [name] co guardian [phone]     Emotional Assessment Appearance:: Appears stated age Attitude/Demeanor/Rapport: Gracious Affect (typically observed): Accepting Orientation: : Oriented to Self Alcohol / Substance Use: Not Applicable Psych Involvement: No (comment)  Admission diagnosis:  Bilateral ankle fractures [Q28.638T,  S82.892A] Closed fracture of left ankle, initial encounter [S82.892A] Patient Active Problem List   Diagnosis Date Noted  . Ankle  fracture, left 05/05/2020  . Pressure injury of skin 05/05/2020  . OAB (overactive bladder) 10/25/2014  . Paroxysmal a-fib (HCC) 10/25/2014  . Obesity (BMI 30-39.9) 06/08/2013  . Dementia (HCC) 06/08/2013  . DM type 2 (diabetes mellitus, type 2) (HCC) 01/05/2010  . Osteoporosis 04/08/2008  . CARPAL TUNNEL SYNDROME, RIGHT 08/22/2006  . HLD (hyperlipidemia) 08/19/2006  . Essential hypertension 08/19/2006  . Mild intermittent asthma 08/09/2006  . GERD 08/09/2006  . Osteoarthritis 08/09/2006   PCP:  Lorre Munroe, NP Pharmacy:  No Pharmacies Listed    Social Determinants of Health (SDOH) Interventions    Readmission Risk Interventions No flowsheet data found.

## 2020-05-06 NOTE — Plan of Care (Signed)
  Problem: Education: Goal: Knowledge of General Education information will improve Description: Including pain rating scale, medication(s)/side effects and non-pharmacologic comfort measures Outcome: Progressing   Problem: Activity: Goal: Risk for activity intolerance will decrease Outcome: Progressing   Problem: Nutrition: Goal: Adequate nutrition will be maintained Outcome: Progressing   

## 2020-05-06 NOTE — Progress Notes (Signed)
The patient has been re-examined, and the chart reviewed, and there have been no interval changes to the documented history and physical.    The risks, benefits, and alternatives have been discussed at length with her DPOA, and they and patient is willing to proceed.    The risks benefits and alternatives were discussed with the patient including but not limited to the risks of nonoperative treatment, versus surgical intervention including infection, bleeding, nerve injury, malunion, nonunion, the need for revision surgery, hardware prominence, hardware failure, the need for hardware removal, blood clots, cardiopulmonary complications, morbidity, mortality, among others, and they were willing to proceed.    Eulas Post, MD

## 2020-05-07 ENCOUNTER — Encounter (HOSPITAL_COMMUNITY): Payer: Self-pay | Admitting: Orthopedic Surgery

## 2020-05-07 DIAGNOSIS — S82892A Other fracture of left lower leg, initial encounter for closed fracture: Secondary | ICD-10-CM | POA: Diagnosis not present

## 2020-05-07 LAB — TYPE AND SCREEN
ABO/RH(D): A POS
Antibody Screen: NEGATIVE
Unit division: 0
Unit division: 0

## 2020-05-07 LAB — GLUCOSE, CAPILLARY
Glucose-Capillary: 177 mg/dL — ABNORMAL HIGH (ref 70–99)
Glucose-Capillary: 275 mg/dL — ABNORMAL HIGH (ref 70–99)
Glucose-Capillary: 179 mg/dL — ABNORMAL HIGH (ref 70–99)
Glucose-Capillary: 193 mg/dL — ABNORMAL HIGH (ref 70–99)

## 2020-05-07 LAB — BPAM RBC
Blood Product Expiration Date: 202205092359
Blood Product Expiration Date: 202205102359
ISSUE DATE / TIME: 202204181424
ISSUE DATE / TIME: 202204190752
Unit Type and Rh: 6200
Unit Type and Rh: 6200

## 2020-05-07 LAB — CBC
HCT: 34.5 % — ABNORMAL LOW (ref 36.0–46.0)
Hemoglobin: 9.6 g/dL — ABNORMAL LOW (ref 12.0–15.0)
MCH: 22.3 pg — ABNORMAL LOW (ref 26.0–34.0)
MCHC: 27.8 g/dL — ABNORMAL LOW (ref 30.0–36.0)
MCV: 80.2 fL (ref 80.0–100.0)
Platelets: 218 10*3/uL (ref 150–400)
RBC: 4.3 MIL/uL (ref 3.87–5.11)
RDW: 24 % — ABNORMAL HIGH (ref 11.5–15.5)
WBC: 10 10*3/uL (ref 4.0–10.5)
nRBC: 0 % (ref 0.0–0.2)

## 2020-05-07 MED ORDER — SODIUM CHLORIDE 0.9 % IV SOLN
1.0000 g | INTRAVENOUS | Status: DC
  Administered 2020-05-07: 1 g via INTRAVENOUS
  Filled 2020-05-07: qty 10
  Filled 2020-05-07: qty 1

## 2020-05-07 MED ORDER — INSULIN ASPART 100 UNIT/ML ~~LOC~~ SOLN
3.0000 [IU] | Freq: Three times a day (TID) | SUBCUTANEOUS | Status: DC
  Administered 2020-05-07 – 2020-05-08 (×4): 3 [IU] via SUBCUTANEOUS

## 2020-05-07 NOTE — Progress Notes (Signed)
Subjective: 1 Day Post-Op s/p Procedure(s): OPEN REDUCTION INTERNAL FIXATION (ORIF) BIMALLEOLAR LEFT  ANKLE FRACTURE  Sitting up in bed, pleasantly confused. Reports no pain this morning. No complaints.  Objective:  PE: VITALS:   Vitals:   05/06/20 2254 05/07/20 0323 05/07/20 0500 05/07/20 0617  BP: 140/77 (!) 147/54  (!) 140/58  Pulse: 66 64  61  Resp: _0 Temp: 97.7 F (36.5 C) (!) 97 F (36.1 C)  98.4 F (36.9 C)  TempSrc:      SpO2: 98% 98%  97%  Weight:   80.5 kg   Height:       General: Alert, sitting up in bed, no acute distress. Oriented to person only.  Respiratory: No use of accessory musculature GI: abdomen is soft and non-tender MSK: LLE - Short leg splint in place.  Patient able to wiggle toes of left foot.  Endorses sensation to all toes of left foot.  Capillary refill intact.   LABS  Results for orders placed or performed during the hospital encounter of 05/05/20 (from the past 24 hour(s))  Glucose, capillary     Status: Abnormal   Collection Time: 05/06/20  3:14 PM  Result Value Ref Range   Glucose-Capillary 158 (H) 70 - 99 mg/dL  Hemoglobin and hematocrit, blood     Status: Abnormal   Collection Time: 05/06/20  3:15 PM  Result Value Ref Range   Hemoglobin 8.9 (L) 12.0 - 15.0 g/dL   HCT 30.1 (L) 36.0 - 46.0 %  Glucose, capillary     Status: Abnormal   Collection Time: 05/06/20  8:15 PM  Result Value Ref Range   Glucose-Capillary 238 (H) 70 - 99 mg/dL  CBC     Status: Abnormal   Collection Time: 05/07/20  4:40 AM  Result Value Ref Range   WBC 10.0 4.0 - 10.5 K/uL   RBC 4.30 3.87 - 5.11 MIL/uL   Hemoglobin 9.6 (L) 12.0 - 15.0 g/dL   HCT 34.5 (L) 36.0 - 46.0 %   MCV 80.2 80.0 - 100.0 fL   MCH 22.3 (L) 26.0 - 34.0 pg   MCHC 27.8 (L) 30.0 - 36.0 g/dL   RDW 24.0 (H) 11.5 - 15.5 %   Platelets 218 150 - 400 K/uL   nRBC 0.0 0.0 - 0.2 %  Glucose, capillary     Status: Abnormal   Collection Time: 05/07/20  7:43 AM  Result Value Ref Range    Glucose-Capillary 177 (H) 70 - 99 mg/dL    DG Pelvis 1-2 Views  Result Date: 05/05/2020 CLINICAL DATA:  [place]. EXAM: PELVIS - 1-2 VIEW COMPARISON:  03/03/2014 FINDINGS: Both hips are normally located. Stable appearing bilateral hip joint degenerative changes but no definite acute hip fracture. The pubic symphysis and SI joints are intact. Remote healed right pubic rami fractures are noted. No definite acute pelvic fractures. IMPRESSION: 1. No acute bony findings. 2. Remote healed right pubic rami fractures. Electronically Signed   By: Marijo Sanes M.D.   On: 05/05/2020 08:30   DG Ankle Complete Left  Result Date: 05/05/2020 CLINICAL DATA:  [place].  Injured left ankle. EXAM: LEFT ANKLE COMPLETE - 3+ VIEW COMPARISON:  None. FINDINGS: Bimalleolar ankle fractures are noted. There is an oblique minimally displaced fracture involving the lateral malleolus at and above the level of the ankle mortise. There is also a mildly displaced transverse fracture through the base of the medial malleolus at the level of the ankle mortise. No  fracture of the posterior malleolus of tibia. The talus is intact. Dense calcification noted posteriorly likely in the Achilles tendon possibly related to a chronic tear. Fairly extensive small vessel calcifications. IMPRESSION: Bimalleolar ankle fractures. Electronically Signed   By: Marijo Sanes M.D.   On: 05/05/2020 08:28   CT Head Wo Contrast  Result Date: 05/05/2020 CLINICAL DATA:  Facial trauma.  Neck trauma.  Unwitnessed fall. EXAM: CT HEAD WITHOUT CONTRAST CT CERVICAL SPINE WITHOUT CONTRAST TECHNIQUE: Multidetector CT imaging of the head and cervical spine was performed following the standard protocol without intravenous contrast. Multiplanar CT image reconstructions of the cervical spine were also generated. COMPARISON:  Head CT 02/09/2015. CT of the cervical spine 03/04/2014. FINDINGS: CT HEAD FINDINGS Brain: Mildly motion degraded exam. Moderate to moderately advanced  cerebral atrophy. Comparatively mild cerebellar atrophy. Mild ill-defined and patchy hypoattenuation within the cerebral white matter is nonspecific, but compatible with chronic small vessel ischemic disease. There is no acute intracranial hemorrhage. No demarcated cortical infarct. No extra-axial fluid collection. No evidence of intracranial mass. No midline shift. Partially empty sella turcica. Vascular: No hyperdense vessel.  Atherosclerotic calcifications Skull: Normal. Negative for fracture or focal lesion. Sinuses/Orbits: Visualized orbits show no acute finding. Mild bilateral ethmoid sinus mucosal thickening. Other: Nonspecific 9 mm polypoid soft tissue focus within the posterior right nasopharynx (series 2, image 5) (series 3, image 8). Trace fluid within the left mastoid air cells. CT CERVICAL SPINE FINDINGS Mildly motion degraded exam. Alignment: Cervical levocurvature. 2 mm C3-C4 grade 1 retrolisthesis. Trace C7-T1 grade 1 anterolisthesis. Skull base and vertebrae: The basion-dental and atlanto-dental intervals are maintained.No evidence of acute fracture to the cervical spine. Soft tissues and spinal canal: No prevertebral fluid or swelling. No visible canal hematoma. Disc levels: Cervical spondylosis with multilevel disc space narrowing, disc bulges, posterior disc osteophytes, endplate spurring, uncovertebral hypertrophy and facet arthrosis. There is fusion across the C5-C6 disc space as well as facet joint ankylosis on the right at this level. Otherwise, disc space narrowing is greatest at C6-C7 (moderate/advanced). Multilevel spinal canal stenosis. Most notably, calcified disc bulges contribute to moderate/severe spinal canal stenosis at C3-C4 and C4-C5. Multilevel bony neural foraminal narrowing. Degenerative changes are also present at the C1-C2 articulation. Upper chest: No consolidation within the imaged lung apices. Interlobular septal thickening within the imaged lung apices, nonspecific but  possibly reflecting edema. Other: Right thyroid lobe nodule measuring at least 2.1 cm. IMPRESSION: CT head: 1. Mildly motion degraded examination. 2. No evidence of acute intracranial abnormality. 3. Moderate to moderately advanced cerebral atrophy with comparatively mild cerebellar atrophy, progressed as compared to the head CT of 02/09/2015. 4. Mild cerebral white matter chronic small vessel ischemic disease. 5. Nonspecific 9 mm polypoid soft tissue focus within the posterior right nasopharynx. Nonemergent ENT consultation recommended. CT cervical spine: 1. Mildly motion degraded examination. 2. No evidence of acute fracture to the cervical spine. 3. Cervical levocurvature. 4. 2 mm C3-C4 grade 1 retrolisthesis. 5. Trace C7-T1 grade 1 anterolisthesis. 6. Cervical spondylosis as outlined with degenerative appearing fusion at C5-C6. Multilevel spinal canal stenosis. Most notably, there is apparent moderate/severe spinal canal stenosis at C3-C4 and C4-C5. Multilevel bony neural foraminal narrowing. 7. Right thyroid lobe nodule measuring at least 2.1 cm. Nonemergent thyroid ultrasound recommended for further evaluation. Electronically Signed   By: Kellie Simmering DO   On: 05/05/2020 08:57   CT Cervical Spine Wo Contrast  Result Date: 05/05/2020 CLINICAL DATA:  Facial trauma.  Neck trauma.  Unwitnessed fall. EXAM: CT  HEAD WITHOUT CONTRAST CT CERVICAL SPINE WITHOUT CONTRAST TECHNIQUE: Multidetector CT imaging of the head and cervical spine was performed following the standard protocol without intravenous contrast. Multiplanar CT image reconstructions of the cervical spine were also generated. COMPARISON:  Head CT 02/09/2015. CT of the cervical spine 03/04/2014. FINDINGS: CT HEAD FINDINGS Brain: Mildly motion degraded exam. Moderate to moderately advanced cerebral atrophy. Comparatively mild cerebellar atrophy. Mild ill-defined and patchy hypoattenuation within the cerebral white matter is nonspecific, but compatible  with chronic small vessel ischemic disease. There is no acute intracranial hemorrhage. No demarcated cortical infarct. No extra-axial fluid collection. No evidence of intracranial mass. No midline shift. Partially empty sella turcica. Vascular: No hyperdense vessel.  Atherosclerotic calcifications Skull: Normal. Negative for fracture or focal lesion. Sinuses/Orbits: Visualized orbits show no acute finding. Mild bilateral ethmoid sinus mucosal thickening. Other: Nonspecific 9 mm polypoid soft tissue focus within the posterior right nasopharynx (series 2, image 5) (series 3, image 8). Trace fluid within the left mastoid air cells. CT CERVICAL SPINE FINDINGS Mildly motion degraded exam. Alignment: Cervical levocurvature. 2 mm C3-C4 grade 1 retrolisthesis. Trace C7-T1 grade 1 anterolisthesis. Skull base and vertebrae: The basion-dental and atlanto-dental intervals are maintained.No evidence of acute fracture to the cervical spine. Soft tissues and spinal canal: No prevertebral fluid or swelling. No visible canal hematoma. Disc levels: Cervical spondylosis with multilevel disc space narrowing, disc bulges, posterior disc osteophytes, endplate spurring, uncovertebral hypertrophy and facet arthrosis. There is fusion across the C5-C6 disc space as well as facet joint ankylosis on the right at this level. Otherwise, disc space narrowing is greatest at C6-C7 (moderate/advanced). Multilevel spinal canal stenosis. Most notably, calcified disc bulges contribute to moderate/severe spinal canal stenosis at C3-C4 and C4-C5. Multilevel bony neural foraminal narrowing. Degenerative changes are also present at the C1-C2 articulation. Upper chest: No consolidation within the imaged lung apices. Interlobular septal thickening within the imaged lung apices, nonspecific but possibly reflecting edema. Other: Right thyroid lobe nodule measuring at least 2.1 cm. IMPRESSION: CT head: 1. Mildly motion degraded examination. 2. No evidence of  acute intracranial abnormality. 3. Moderate to moderately advanced cerebral atrophy with comparatively mild cerebellar atrophy, progressed as compared to the head CT of 02/09/2015. 4. Mild cerebral white matter chronic small vessel ischemic disease. 5. Nonspecific 9 mm polypoid soft tissue focus within the posterior right nasopharynx. Nonemergent ENT consultation recommended. CT cervical spine: 1. Mildly motion degraded examination. 2. No evidence of acute fracture to the cervical spine. 3. Cervical levocurvature. 4. 2 mm C3-C4 grade 1 retrolisthesis. 5. Trace C7-T1 grade 1 anterolisthesis. 6. Cervical spondylosis as outlined with degenerative appearing fusion at C5-C6. Multilevel spinal canal stenosis. Most notably, there is apparent moderate/severe spinal canal stenosis at C3-C4 and C4-C5. Multilevel bony neural foraminal narrowing. 7. Right thyroid lobe nodule measuring at least 2.1 cm. Nonemergent thyroid ultrasound recommended for further evaluation. Electronically Signed   By: Kellie Simmering DO   On: 05/05/2020 08:57   DG Knee Complete 4 Views Left  Result Date: 05/05/2020 CLINICAL DATA:  [place]. EXAM: LEFT KNEE - COMPLETE 4+ VIEW COMPARISON:  None. FINDINGS: Moderate age related degenerative changes but no acute fracture or osteochondral lesion. Chondrocalcinosis is noted. Small knee joint effusion. Vascular calcifications noted. Remote healed proximal fibular fractures noted. IMPRESSION: 1. No acute bony findings. 2. Small knee joint effusion. 3. 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 Electronically Signed   By: Marijo Sanes M.D.   On: 05/05/2020 08:31   ECHOCARDIOGRAM COMPLETE  Result Date: 05/05/2020  ECHOCARDIOGRAM REPORT   Patient Name:   Denise [name] Date of Exam: 05/05/2020 Medical Rec #:  [id number]       Height:       63.0 in Accession #:    [id number]      Weight:       196.0 lb Date of Birth:  [birth year]       BSA:          1.917 m Patient Age:    28 years         BP:           123/51 mmHg Patient Gender: F               HR:           69 bpm. Exam Location:  Inpatient Procedure: 2D Echo, 3D Echo, Cardiac Doppler and Color Doppler Indications:    R55 Syncope  History:        Patient has no prior history of Echocardiogram examinations.                 Abnormal ECG, Arrythmias:Atrial Fibrillation,                 Signs/Symptoms:Alzheimer's and Altered Mental Status; Risk                 Factors:Hypertension, Diabetes and Dyslipidemia.  Sonographer:    Roseanna Rainbow RDCS Referring Phys: [id number] Jonnie Finner  Sonographer Comments: Technically difficult study due to poor echo windows. Image acquisition challenging due to uncooperative patient. Patient supine and pushed my hand away at the end of study. IMPRESSIONS  1. There is severe aortic stenosis with AVA 0.8 by continuity equation, mean gradient 78mHg, peak gradient 783mg, Vmax 4.1743m DI 0.27.  2. There is mild calcific mitral stenosis with mean gradient 8mm39mat HR 74bpm. MVA by continuity equation is 1.2cm2  3. Left ventricular ejection fraction, by estimation, is 60 to 65%. The left ventricle has normal function. The left ventricle has no regional wall motion abnormalities. There is moderate concentric left ventricular hypertrophy. Diastolic function indeterminant due to severe MAC. Elevated left atrial pressure.  4. Right ventricular systolic function is normal. The right ventricular size is normal. Tricuspid regurgitation signal is inadequate for assessing PA pressure.  5. Left atrial size was mildly dilated.  6. The mitral valve is abnormal. Trivial mitral valve regurgitation. Severe mitral annular calcification.  7. The aortic valve is calcified. There is severe calcifcation of the aortic valve. There is severe thickening of the aortic valve. Aortic valve regurgitation is trivial. Severe aortic valve stenosis. Comparison(s): No prior Echocardiogram. FINDINGS  Left Ventricle: Left ventricular ejection fraction, by  estimation, is 60 to 65%. The left ventricle has normal function. The left ventricle has no regional wall motion abnormalities. The left ventricular internal cavity size was normal in size. There is  moderate concentric left ventricular hypertrophy. Diastolic function indeterminant due to severe MAC. Elevated left atrial pressure. The E/e' is 27. 22ght Ventricle: The right ventricular size is normal. No increase in right ventricular wall thickness. Right ventricular systolic function is normal. Tricuspid regurgitation signal is inadequate for assessing PA pressure. Left Atrium: Left atrial size was mildly dilated. Right Atrium: Right atrial size was normal in size. Pericardium: There is no evidence of pericardial effusion. Mitral Valve: There is mild calcific mitral stenosis with mean gradient 8mmH3mt HR 74bpm. MVA by continuity equation is 1.2cm2. The mitral valve is abnormal. There is moderate thickening of  the mitral valve leaflet(s). There is moderate calcification of  the mitral valve leaflet(s). Severe mitral annular calcification. Trivial mitral valve regurgitation. MV peak gradient, 25.5 mmHg. The mean mitral valve gradient is 8.0 mmHg. Tricuspid Valve: The tricuspid valve is normal in structure. Tricuspid valve regurgitation is trivial. Aortic Valve: There is severe aortic stenosis with AVA 0.8 by continuity equation, mean gradient 53mHg, peak gradient 74mg, Vmax 4.1713m DI 0.27. The aortic valve is calcified. There is severe calcifcation of the aortic valve. There is severe thickening of the aortic valve. Aortic valve regurgitation is trivial. Severe aortic stenosis is present. Aortic valve mean gradient measures 42.2 mmHg. Aortic valve peak gradient measures 69.6 mmHg. Aortic valve area, by VTI measures 1.29 cm. Pulmonic Valve: The pulmonic valve was not well visualized. Pulmonic valve regurgitation is trivial. Aorta: The aortic root is normal in size and structure. IAS/Shunts: No atrial level shunt  detected by color flow Doppler.  LEFT VENTRICLE PLAX 2D LVIDd:         4.50 cm     Diastology LVIDs:         3.00 cm     LV e' medial:    6.20 cm/s LV PW:         1.60 cm     LV E/e' medial:  26.9 LV IVS:        1.30 cm     LV e' lateral:   5.44 cm/s LVOT diam:     1.90 cm     LV E/e' lateral: 30.7 LV SV:         130 LV SV Index:   68 LVOT Area:     2.84 cm  LV Volumes (MOD) LV vol d, MOD A2C: 58.8 ml LV vol d, MOD A4C: 67.8 ml LV vol s, MOD A2C: 29.8 ml LV vol s, MOD A4C: 21.2 ml LV SV MOD A2C:     29.0 ml LV SV MOD A4C:     67.8 ml LV SV MOD BP:      38.2 ml RIGHT VENTRICLE            IVC RV S prime:     9.46 cm/s  IVC diam: 2.10 cm TAPSE (M-mode): 2.2 cm LEFT ATRIUM             Index       RIGHT ATRIUM           Index LA diam:        4.00 cm 2.09 cm/m  RA Area:     11.00 cm LA Vol (A2C):   41.2 ml 21.49 ml/m RA Volume:   21.10 ml  11.01 ml/m LA Vol (A4C):   47.4 ml 24.72 ml/m LA Biplane Vol: 45.0 ml 23.47 ml/m  AORTIC VALVE AV Area (Vmax):    1.42 cm AV Area (Vmean):   1.30 cm AV Area (VTI):     1.29 cm AV Vmax:           417.25 cm/s AV Vmean:          306.500 cm/s AV VTI:            1.002 m AV Peak Grad:      69.6 mmHg AV Mean Grad:      42.2 mmHg LVOT Vmax:         209.00 cm/s LVOT Vmean:        141.000 cm/s LVOT VTI:          0.457 m LVOT/AV VTI  ratio: 0.46  AORTA Ao Root diam: 2.60 cm MITRAL VALVE MV Area (PHT): 4.39 cm     SHUNTS MV Area VTI:   1.89 cm     Systemic VTI:  0.46 m MV Peak grad:  25.5 mmHg    Systemic Diam: 1.90 cm MV Mean grad:  8.0 mmHg MV Vmax:       2.53 m/s MV Vmean:      120.5 cm/s MV Decel Time: 173 msec MV E velocity: 167.00 cm/s MV A velocity: 214.00 cm/s MV E/A ratio:  0.78 Gwyndolyn Kaufman MD Electronically signed by Gwyndolyn Kaufman MD Signature Date/Time: 05/05/2020/4:36:45 PM    Final     Assessment/Plan: Left bimalleolar ankle fracture  1 Day Post-Op s/p Procedure(s): OPEN REDUCTION INTERNAL FIXATION (ORIF) BIMALLEOLAR LEFT  ANKLE FRACTURE  Weightbearing: NWB  LLE Orthopedic device(s): Splint, keep in place at all times VTE prophylaxis: Hbg 9.6 this morning, will continue to watch, home Xarelto to resume at 5 pm today Pain control: tylenol, hydrocodone 5/325 on board but limit use as much as possible Follow - up plan: 2 weeks with Dr. Mardelle Matte Dispo: pending OT and PT evals today  Contact information:   Weekdays 8-5 Merlene Pulling, PA-C [phone] A fter hours and holidays please check [email or web address] for group call information for [facility]  Ventura Bruns 05/07/2020, 7:53 AM

## 2020-05-07 NOTE — Evaluation (Signed)
Occupational Therapy Evaluation Patient Details Name: Denise [name] MRN: [id number] DOB: 1940-08-12 Today's Date: 05/07/2020    History of Present Illness Denise [name] is a 80 y.o. female with medical history significant of dementia, afib on anticoagulation, COPD, DM2 who presented with leg pain after unwitnessed fall. Patient is poor historian due to underlying dementia. Apparently during 3rd shift last night at [facility] facility, it was noted that the patient had fallen and was having difficulty getting up. EMS was called for transfer to ED. She was found to have a left ankle fracture. S/p ORIF by Dr. Dion Saucier on 4/19.   Clinical Impression   Denise [name] is an 80 year old woman s/p ORIF of left anklle with a LLE NWB status. Patient is pleasant, doesn't complain of pain in ankle and able to follow commands. However, patient is only alert to self and her memory is poor with no retention of information (place, weight bearing status, had ankle surgery). Patient min assist to transfer to side of bed. Patient unable to stand from edge of bed despite assistance of two. Patient mod assist to scoot to the right into the recliner - with second assistance for safety. Patient needing increased assistance for ADLs especially LB dressing and toileting - patient limited by WB status. Patient will benefit from skilled OT services while in hospital to improve functional mobility and assistance with ADLs in order to reduce caregiver burden and for patient to discharge to memory care unit.       Follow Up Recommendations  Other (comment) (Recommend return to Memory Care with more assistance.)    Equipment Recommendations  None recommended by OT    Recommendations for Other Services       Precautions / Restrictions Precautions Precautions: Fall Restrictions Weight Bearing Restrictions: Yes LLE Weight Bearing: Non weight bearing      Mobility Bed Mobility Overal bed mobility:  Needs Assistance Bed Mobility: Supine to Sit     Supine to sit: Min assist          Transfers Overall transfer level: Needs assistance Equipment used: Rolling walker (2 wheeled) Transfers: Sit to/from Stand;Lateral/Scoot Transfers Sit to Stand: Mod assist;+2 physical assistance        Lateral/Scoot Transfers: Mod assist;+2 safety/equipment General transfer comment: 5 sec standing    Balance Overall balance assessment: Needs assistance Sitting-balance support: No upper extremity supported Sitting balance-Leahy Scale: Good                                     ADL either performed or assessed with clinical judgement   ADL Overall ADL's : Needs assistance/impaired Eating/Feeding: Supervision/ safety Eating/Feeding Details (indicate cue type and reason): able to feed self but needs supervision - attempting to use fork in juice. Grooming: Set up;Supervision/safety Grooming Details (indicate cue type and reason): verbal cues to wash face. Needed second cue to actually perform because she forgot almost immdiately. Upper Body Bathing: Set up;Supervision/ safety;Moderate assistance;Sitting   Lower Body Bathing: Maximal assistance;Sitting/lateral leans   Upper Body Dressing : Moderate assistance;Sitting   Lower Body Dressing: Total assistance;Bed level   Toilet Transfer: +2 for safety/equipment;Moderate assistance;Requires drop arm;BSC   Toileting- Clothing Manipulation and Hygiene: Maximal assistance;Sitting/lateral lean               Vision Patient Visual Report: No change from baseline       Perception  Praxis      Pertinent Vitals/Pain Pain Assessment: Faces Faces Pain Scale: Hurts a little bit Pain Location: pain around waist due to gait belt Pain Descriptors / Indicators: Grimacing Pain Intervention(s): Monitored during session;Limited activity within patient's tolerance     Hand Dominance Right   Extremity/Trunk Assessment Upper  Extremity Assessment Upper Extremity Assessment: Overall WFL for tasks assessed   Lower Extremity Assessment Lower Extremity Assessment: Defer to PT evaluation   Cervical / Trunk Assessment Cervical / Trunk Assessment: Kyphotic   Communication Communication Communication: HOH   Cognition Arousal/Alertness: Awake/alert Behavior During Therapy: WFL for tasks assessed/performed Overall Cognitive Status: History of cognitive impairments - at baseline                                 General Comments: Able to follow commands. Impaired memory.   General Comments       Exercises     Shoulder Instructions      Home Living Family/patient expects to be discharged to:: Skilled nursing facility                                 Additional Comments: From a memory care unit. PLOF unknown.      Prior Functioning/Environment Level of Independence: Needs assistance  Gait / Transfers Assistance Needed: unknown. ADL's / Homemaking Assistance Needed: Needs at least set up/supervision for all ADLs due to dementia.            OT Problem List: Decreased activity tolerance;Impaired balance (sitting and/or standing);Decreased knowledge of precautions;Decreased knowledge of use of DME or AE;Decreased safety awareness;Decreased cognition;Pain      OT Treatment/Interventions: Self-care/ADL training;DME and/or AE instruction;Therapeutic activities;Patient/family education    OT Goals(Current goals can be found in the care plan section) Acute Rehab OT Goals OT Goal Formulation: Patient unable to participate in goal setting Time For Goal Achievement: 05/20/20 Potential to Achieve Goals: Fair  OT Frequency: Min 2X/week   Barriers to D/C:            Co-evaluation              AM-PAC OT "6 Clicks" Daily Activity     Outcome Measure Help from another person eating meals?: A Little Help from another person taking care of personal grooming?: A Little Help  from another person toileting, which includes using toliet, bedpan, or urinal?: A Lot Help from another person bathing (including washing, rinsing, drying)?: A Lot Help from another person to put on and taking off regular upper body clothing?: A Lot Help from another person to put on and taking off regular lower body clothing?: Total 6 Click Score: 13   End of Session Equipment Utilized During Treatment: Rolling walker;Gait belt Nurse Communication: Mobility status  Activity Tolerance: Patient tolerated treatment well Patient left: in chair;with call bell/phone within reach;with chair alarm set  OT Visit Diagnosis: Other abnormalities of gait and mobility (R26.89);Pain;Other symptoms and signs involving cognitive function                Time: 0910-0940 OT Time Calculation (min): 30 min Charges:  OT General Charges $OT Visit: 1 Visit OT Evaluation $OT Eval Moderate Complexity: 1 Mod  Lanika Colgate, OTR/L Acute Care Rehab Services  Office [phone] Pager: [phone]   Kelli Churn 05/07/2020, 12:02 PM

## 2020-05-07 NOTE — Evaluation (Signed)
Physical Therapy Evaluation Patient Details Name: Denise [name] MRN: [id number] DOB: 06/01/1940 Today's Date: 05/07/2020   History of Present Illness  Denise [name] is a 80 y.o. female s/p L ankle ORIF 05/06/20 due to bimalleolar ankle fracture from fall. PMH: dementia, afib, COPD, DM2, HTN    Clinical Impression  Pt admitted with above diagnosis. Unsure of pt's baseline, pt pleasantly oriented to self only. Reviewed LLE NWB multiple times during session due to ORIF, pt able to maintain LLE NWB with mobility and cues. Pt currently requiring +2 for STS and lateral scoot transfers, cues for hand placement and attention to task. Recommend d/c back to memory care if able to provide physical assist vs. SNF for short term strengthening due to current functional status and assist level. Pt currently with functional limitations due to the deficits listed below (see PT Problem List). Pt will benefit from skilled PT to increase their independence and safety with mobility to allow discharge to the venue listed below.       Follow Up Recommendations Supervision/Assistance - 24 hour (SNF vs Memory Care)    Equipment Recommendations  None recommended by PT    Recommendations for Other Services       Precautions / Restrictions Precautions Precautions: Fall Restrictions Weight Bearing Restrictions: Yes LLE Weight Bearing: Non weight bearing      Mobility  Bed Mobility Overal bed mobility: Needs Assistance Bed Mobility: Supine to Sit  Supine to sit: Min assist  General bed mobility comments: cues for sequencing, pt pulls on therapist's hand to upright trunk and scoot out to EOB    Transfers Overall transfer level: Needs assistance Equipment used: Rolling walker (2 wheeled) Transfers: Sit to/from Stand;Lateral/Scoot Transfers Sit to Stand: Mod assist;+2 physical assistance   Lateral/Scoot Transfers: Mod assist;+2 safety/equipment General transfer comment: cues for hand placement and NWB  LLE, mod +2 to rise to stand and tolerates ~5 sec standing with RW, mod A +2 for safety with cues for attention to task  Ambulation/Gait  General Gait Details: unable  Stairs            Wheelchair Mobility    Modified Rankin (Stroke Patients Only)       Balance Overall balance assessment: Needs assistance;History of Falls Sitting-balance support: No upper extremity supported Sitting balance-Leahy Scale: Good Sitting balance - Comments: seated EOB and with lateral scoot transfer   Standing balance support: During functional activity;Bilateral upper extremity supported Standing balance-Leahy Scale: Poor Standing balance comment: reliant on UE support, ~5 sec standing with RW and min A       Pertinent Vitals/Pain Pain Assessment: Faces Faces Pain Scale: Hurts a little bit Pain Location: pain around waist due to gait belt Pain Descriptors / Indicators: Grimacing Pain Intervention(s): Limited activity within patient's tolerance;Monitored during session;Repositioned    Home Living Family/patient expects to be discharged to:: Skilled nursing facility  Additional Comments: From a memory care unit. PLOF unknown, pt oriented to self only    Prior Function Level of Independence: Needs assistance   Gait / Transfers Assistance Needed: unsure, pt oriented to self only  ADL's / Homemaking Assistance Needed: Needs at least set up/supervision for all ADLs due to dementia.        Hand Dominance   Dominant Hand: Right    Extremity/Trunk Assessment   Upper Extremity Assessment Upper Extremity Assessment: Defer to OT evaluation    Lower Extremity Assessment Lower Extremity Assessment: LLE deficits/detail;RLE deficits/detail RLE Deficits / Details: AROM WNL, strength 4-/5 throughout,  denies numbness/tingling LLE Deficits / Details: wiggles toes, hip and knee AROM WNL and strength grossly 3/5, ankle NT in dressing LLE Sensation: WNL    Cervical / Trunk  Assessment Cervical / Trunk Assessment: Kyphotic  Communication   Communication: HOH  Cognition Arousal/Alertness: Awake/alert Behavior During Therapy: WFL for tasks assessed/performed Overall Cognitive Status: History of cognitive impairments - at baseline  General Comments: Pt oriented to self, pleasantly confused, follows one-step commands with increased time, easily distracted, cued for attention to task.      General Comments      Exercises     Assessment/Plan    PT Assessment Patient needs continued PT services  PT Problem List Decreased strength;Decreased range of motion;Decreased activity tolerance;Decreased balance;Decreased mobility;Decreased cognition;Decreased knowledge of use of DME;Decreased safety awareness;Pain;Obesity       PT Treatment Interventions DME instruction;Gait training;Functional mobility training;Therapeutic activities;Therapeutic exercise;Balance training;Cognitive remediation;Patient/family education    PT Goals (Current goals can be found in the Care Plan section)  Acute Rehab PT Goals Patient Stated Goal: none stated PT Goal Formulation: With patient Time For Goal Achievement: 05/21/20 Potential to Achieve Goals: Fair    Frequency Min 2X/week   Barriers to discharge        Co-evaluation PT/OT/SLP Co-Evaluation/Treatment: Yes Reason for Co-Treatment: Complexity of the patient's impairments (multi-system involvement);For patient/therapist safety;To address functional/ADL transfers PT goals addressed during session: Mobility/safety with mobility;Balance;Strengthening/ROM         AM-PAC PT "6 Clicks" Mobility  Outcome Measure Help needed turning from your back to your side while in a flat bed without using bedrails?: A Little Help needed moving from lying on your back to sitting on the side of a flat bed without using bedrails?: A Little Help needed moving to and from a bed to a chair (including a wheelchair)?: Total Help needed standing  up from a chair using your arms (e.g., wheelchair or bedside chair)?: Total Help needed to walk in hospital room?: Total Help needed climbing 3-5 steps with a railing? : Total 6 Click Score: 10    End of Session Equipment Utilized During Treatment: Gait belt Activity Tolerance: Patient tolerated treatment well Patient left: in chair;with call bell/phone within reach;with chair alarm set Nurse Communication: Mobility status;Other (comment) (restraints not applied and breakfast tray in front of pt- RN aware) PT Visit Diagnosis: Other abnormalities of gait and mobility (R26.89);Difficulty in walking, not elsewhere classified (R26.2);Pain Pain - Right/Left: Left Pain - part of body: Ankle and joints of foot    Time: 4010-2725 PT Time Calculation (min) (ACUTE ONLY): 28 min   Charges:   PT Evaluation $PT Eval Moderate Complexity: 1 Mod           Tori Keontae Levingston PT, DPT 05/07/20, 1:26 PM

## 2020-05-07 NOTE — Progress Notes (Signed)
Inpatient Diabetes Program Recommendations  AACE/ADA: New Consensus Statement on Inpatient Glycemic Control (2015)  Target Ranges:  Prepandial:   less than 140 mg/dL      Peak postprandial:   less than 180 mg/dL (1-2 hours)      Critically ill patients:  140 - 180 mg/dL   Lab Results  Component Value Date   GLUCAP 275 (H) 05/07/2020   HGBA1C 6.5 (H) 05/05/2020    Review of Glycemic Control Results for LINEA, CALLES (MRN [id number]) as of 05/07/2020 11:37  Ref. Range 05/07/2020 07:43 05/07/2020 11:25  Glucose-Capillary Latest Ref Range: 70 - 99 mg/dL 696 (H) 295 (H)   Diabetes history: DM 2 Outpatient Diabetes medications:  Amaryl 4 mg with breakfast, Lantus (patient not taking), Humalog 8 units in the AM, Tradjenta 5 mg daily Current orders for Inpatient glycemic control:  Novolog sensitive tid with meals   Inpatient Diabetes Program Recommendations:    Consider adding Novolog meal coverage 3 units tid with meals (Hold if patient eats less than 50% or NPO).   Thanks, Beryl Meager, RN, BC-ADM Inpatient Diabetes Coordinator Pager [phone] (8a-5p)

## 2020-05-07 NOTE — Progress Notes (Signed)
PROGRESS NOTE    Denise [name]  XFG:[id number] DOB: 24-Dec-1940 DOA: 05/05/2020 PCP: Jearld Fenton, NP     Brief Narrative:  Denise [name] is a 80 y.o. female with medical history significant of dementia, afib on anticoagulation, COPD, DM2 who presented with leg pain after unwitnessed fall. Patient is poor historian due to underlying dementia. Apparently during 3rd shift last night at [facility] facility, it was noted that the patient had fallen and was having difficulty getting up. EMS was called for transfer to ED. She was found to have a left ankle fracture. Orthopedics was consulted.  Echocardiogram revealed severe aortic stenosis, cardiology was consulted who did not have any pre-op testing recommendations.  She remained at high risk for surgical intervention.  She ultimately underwent ORIF by Dr. Mardelle Matte on 4/19 without immediate complication.  New events last 24 hours / Subjective: Patient pleasantly confused, is able to tell me that she is at the hospital but is not oriented to year.  She has no physical complaints.  Assessment & Plan:   Principal Problem:   Ankle fracture, left Active Problems:   Pressure injury of skin   Left ankle fracture after unwitnessed fall -Status post ORIF with Dr. Mardelle Matte on 4/19 -Recommended nonweightbearing left lower extremity, keep splint in place -Follow-up with Dr. Mardelle Matte in 2 weeks -PT OT pending  Unwitnessed fall -Unclear if syncopal episode versus mechanical fall -Echocardiogram did reveal severe aortic stenosis -Also could be compounded by symptomatic anemia -PT OT pending  Severe aortic stenosis -Per echocardiogram -Cardiology consulted  Iron deficiency anemia, microcytic anemia -Status post 2 unit packed red blood cell, iron transfusion -Resume Xarelto this afternoon  UTI -Urine culture with >100,000 gram-negative rod, identification and sensitivity pending -Start Rocephin  Diabetes mellitus -Hemoglobin  A1c 6.5 -Sliding scale insulin  Paroxysmal A. fib -In sinus bradycardia today -Resume Xarelto this afternoon  Dementia -Continue Aricept, Namenda -Resides at memory care facility  Hyperlipidemia -Continue Lipitor  Hypertension -Continue ramipril  Depression/anxiety -Continue Zoloft   In agreement with assessment of the pressure ulcer as below:  Pressure Injury 05/05/20 Buttocks Right Stage 1 -  Intact skin with non-blanchable redness of a localized area usually over a bony prominence. (Active)  05/05/20 1100  Location: Buttocks  Location Orientation: Right  Staging: Stage 1 -  Intact skin with non-blanchable redness of a localized area usually over a bony prominence.  Wound Description (Comments):   Present on Admission: Yes         DVT prophylaxis:  SCDs Start: 05/06/20 1537 SCDs Start: 05/05/20 1131 rivaroxaban (XARELTO) tablet 20 mg  Code Status: Full Family Communication: None at bedside Disposition Plan:  Status is: Inpatient  Remains inpatient appropriate because:Unsafe d/c plan and IV treatments appropriate due to intensity of illness or inability to take PO   Dispo: The patient is from: Memory care              Anticipated d/c is to: Same              Patient currently is not medically stable to d/c. PT OT pending. Rocephin started for UTI.    Difficult to place patient No      Consultants:   Orthopedic surgery  Cardiology   Antimicrobials:  Anti-infectives (From admission, onward)   Start     Dose/Rate Route Frequency Ordered Stop   05/06/20 1830  ceFAZolin (ANCEF) IVPB 2g/100 mL premix        2 g 200  mL/hr over 30 Minutes Intravenous Every 6 hours 05/06/20 1536 05/07/20 0631   05/06/20 0600  ceFAZolin (ANCEF) IVPB 2g/100 mL premix        2 g 200 mL/hr over 30 Minutes Intravenous On call to O.R. 05/05/20 1948 05/06/20 1238       Objective: Vitals:   05/06/20 2254 05/07/20 0323 05/07/20 0500 05/07/20 0617  BP: 140/77 (!) 147/54   (!) 140/58  Pulse: 66 64  61  Resp: _0 Temp: 97.7 F (36.5 C) (!) 97 F (36.1 C)  98.4 F (36.9 C)  TempSrc:      SpO2: 98% 98%  97%  Weight:   80.5 kg   Height:        Intake/Output Summary (Last 24 hours) at 05/07/2020 0946 Last data filed at 05/07/2020 0645 Gross per 24 hour  Intake 916 ml  Output 1150 ml  Net -234 ml   Filed Weights   05/05/20 1509 05/07/20 0500  Weight: 79.8 kg 80.5 kg    Examination: General exam: Appears calm and comfortable  Respiratory system: Clear to auscultation. Respiratory effort normal. Cardiovascular system: S1 & S2 heard, bradycardic, regular rhythm. No pedal edema. + Systolic murmur Gastrointestinal system: Abdomen is nondistended, soft and nontender. Normal bowel sounds heard. Central nervous system: Alert and oriented to self and place only Extremities: Left lower extremity in splint Psychiatry: Judgement and insight appear poor, dementia, pleasantly confused   Data Reviewed: I have personally reviewed following labs and imaging studies  CBC: Recent Labs  Lab 05/05/20 0901 05/05/20 1955 05/06/20 0046 05/06/20 1515 05/07/20 0440  WBC 13.1*  --  12.0*  --  10.0  NEUTROABS 11.6*  --   --   --   --   HGB 6.6* 8.2* 7.6*  7.7* 8.9* 9.6*  HCT 24.1* 27.7* 26.2*  26.5* 30.1* 34.5*  MCV 67.9*  --  72.0*  --  80.2  PLT 305  --  243  --  979   Basic Metabolic Panel: Recent Labs  Lab 05/05/20 0901 05/06/20 0046  NA 139 139  K 4.0 3.5  CL 107 112*  CO2 22 19*  GLUCOSE 198* 124*  BUN 15 10  CREATININE 0.57 0.60  CALCIUM 8.9 8.4*   GFR: Estimated Creatinine Clearance: 53.9 mL/min (by C-G formula based on SCr of 0.6 mg/dL). Liver Function Tests: Recent Labs  Lab 05/06/20 0046  AST 19  ALT 14  ALKPHOS 41  BILITOT 1.7*  PROT 5.7*  ALBUMIN 3.2*   No results for input(s): LIPASE, AMYLASE in the last 168 hours. No results for input(s): AMMONIA in the last 168 hours. Coagulation Profile: No results for input(s):  INR, PROTIME in the last 168 hours. Cardiac Enzymes: No results for input(s): CKTOTAL, CKMB, CKMBINDEX, TROPONINI in the last 168 hours. BNP (last 3 results) No results for input(s): PROBNP in the last 8760 hours. HbA1C: Recent Labs    05/05/20 1409  HGBA1C 6.5*   CBG: Recent Labs  Lab 05/05/20 2153 05/06/20 0744 05/06/20 1514 05/06/20 2015 05/07/20 0743  GLUCAP 130* 181* 158* 238* 177*   Lipid Profile: No results for input(s): CHOL, HDL, LDLCALC, TRIG, CHOLHDL, LDLDIRECT in the last 72 hours. Thyroid Function Tests: No results for input(s): TSH, T4TOTAL, FREET4, T3FREE, THYROIDAB in the last 72 hours. Anemia Panel: Recent Labs    05/05/20 1131  VITAMINB12 657  FOLATE 43.5  TIBC 443  IRON 7*   Sepsis Labs: No results for input(s): PROCALCITON, LATICACIDVEN in  the last 168 hours.  Recent Results (from the past 240 hour(s))  Resp Panel by RT-PCR (Flu A&B, Covid) Nasopharyngeal Swab     Status: None   Collection Time: 05/05/20  9:01 AM   Specimen: Nasopharyngeal Swab; Nasopharyngeal(NP) swabs in vial transport medium  Result Value Ref Range Status   SARS Coronavirus 2 by RT PCR NEGATIVE NEGATIVE Final    Comment: (NOTE) SARS-CoV-2 target nucleic acids are NOT DETECTED.  The SARS-CoV-2 RNA is generally detectable in upper respiratory specimens during the acute phase of infection. The lowest concentration of SARS-CoV-2 viral copies this assay can detect is 138 copies/mL. A negative result does not preclude SARS-Cov-2 infection and should not be used as the sole basis for treatment or other patient management decisions. A negative result may occur with  improper specimen collection/handling, submission of specimen other than nasopharyngeal swab, presence of viral mutation(s) within the areas targeted by this assay, and inadequate number of viral copies(<138 copies/mL). A negative result must be combined with clinical observations, patient history, and  epidemiological information. The expected result is Negative.  Fact Sheet for Patients:  EntrepreneurPulse.com.au  Fact Sheet for Healthcare Providers:  IncredibleEmployment.be  This test is no t yet approved or cleared by the [place] FDA and  has been authorized for detection and/or diagnosis of SARS-CoV-2 by FDA under an Emergency Use Authorization (EUA). This EUA will remain  in effect (meaning this test can be used) for the duration of the COVID-19 declaration under Section 564(b)(1) of the Act, 21 U.S.C.section 360bbb-3(b)(1), unless the authorization is terminated  or revoked sooner.       Influenza A by PCR NEGATIVE NEGATIVE Final   Influenza B by PCR NEGATIVE NEGATIVE Final    Comment: (NOTE) The Xpert Xpress SARS-CoV-2/FLU/RSV plus assay is intended as an aid in the diagnosis of influenza from Nasopharyngeal swab specimens and should not be used as a sole basis for treatment. Nasal washings and aspirates are unacceptable for Xpert Xpress SARS-CoV-2/FLU/RSV testing.  Fact Sheet for Patients: EntrepreneurPulse.com.au  Fact Sheet for Healthcare Providers: IncredibleEmployment.be  This test is not yet approved or cleared by the [place] FDA and has been authorized for detection and/or diagnosis of SARS-CoV-2 by FDA under an Emergency Use Authorization (EUA). This EUA will remain in effect (meaning this test can be used) for the duration of the COVID-19 declaration under Section 564(b)(1) of the Act, 21 U.S.C. section 360bbb-3(b)(1), unless the authorization is terminated or revoked.  Performed at [facility], [place] [address], [place], [place] [address]   MRSA PCR Screening     Status: None   Collection Time: 05/05/20  3:23 PM   Specimen: Nasal Mucosa; Nasopharyngeal  Result Value Ref Range Status   MRSA by PCR NEGATIVE NEGATIVE Final    Comment:        The  GeneXpert MRSA Assay (FDA approved for NASAL specimens only), is one component of a comprehensive MRSA colonization surveillance program. It is not intended to diagnose MRSA infection nor to guide or monitor treatment for MRSA infections. Performed at [facility], [place] [address] [place], [place] [address]   Surgical pcr screen     Status: Abnormal   Collection Time: 05/05/20  3:23 PM   Specimen: Nasal Mucosa; Nasal Swab  Result Value Ref Range Status   MRSA, PCR NEGATIVE NEGATIVE Final   Staphylococcus aureus POSITIVE (A) NEGATIVE Final    Comment: (NOTE) The Xpert SA Assay (FDA approved for NASAL specimens in  patients 33 years of age and older), is one component of a comprehensive surveillance program. It is not intended to diagnose infection nor to guide or monitor treatment. Performed at [facility], [place] [address] [place], [place] [address]   Culture, Urine     Status: Abnormal (Preliminary result)   Collection Time: 05/06/20  7:29 AM   Specimen: Urine, Catheterized  Result Value Ref Range Status   Specimen Description   Final    URINE, CATHETERIZED Performed at [facility], [place] [address], [place], [place] [address]    Special Requests   Final    NONE Performed at [facility], [place] [address], [place], [place] [address]    Culture (A)  Final    >=100,000 COLONIES/mL GRAM NEGATIVE RODS SUSCEPTIBILITIES TO FOLLOW Performed at [facility], [place] [address] [place],  [address]    Report Status PENDING  Incomplete      Radiology Studies: ECHOCARDIOGRAM COMPLETE  Result Date: 05/05/2020    ECHOCARDIOGRAM REPORT   Patient Name:   BRODI NERY Date of Exam: 05/05/2020 Medical Rec #:  [id number]       Height:       63.0 in Accession #:    [id number]      Weight:       196.0 lb Date of Birth:  [birth year]       BSA:          1.917 m Patient Age:    41 years        BP:            123/51 mmHg Patient Gender: F               HR:           69 bpm. Exam Location:  Inpatient Procedure: 2D Echo, 3D Echo, Cardiac Doppler and Color Doppler Indications:    R55 Syncope  History:        Patient has no prior history of Echocardiogram examinations.                 Abnormal ECG, Arrythmias:Atrial Fibrillation,                 Signs/Symptoms:Alzheimer's and Altered Mental Status; Risk                 Factors:Hypertension, Diabetes and Dyslipidemia.  Sonographer:    Roseanna Rainbow RDCS Referring Phys: [id number] Jonnie Finner  Sonographer Comments: Technically difficult study due to poor echo windows. Image acquisition challenging due to uncooperative patient. Patient supine and pushed my hand away at the end of study. IMPRESSIONS  1. There is severe aortic stenosis with AVA 0.8 by continuity equation, mean gradient 14mHg, peak gradient 723mg, Vmax 4.1764m DI 0.27.  2. There is mild calcific mitral stenosis with mean gradient 8mm44mat HR 74bpm. MVA by continuity equation is 1.2cm2  3. Left ventricular ejection fraction, by estimation, is 60 to 65%. The left ventricle has normal function. The left ventricle has no regional wall motion abnormalities. There is moderate concentric left ventricular hypertrophy. Diastolic function indeterminant due to severe MAC. Elevated left atrial pressure.  4. Right ventricular systolic function is normal. The right ventricular size is normal. Tricuspid regurgitation signal is inadequate for assessing PA pressure.  5. Left atrial size was mildly dilated.  6. The mitral valve is abnormal. Trivial mitral valve regurgitation. Severe mitral annular calcification.  7. The aortic valve is calcified. There  is severe calcifcation of the aortic valve. There is severe thickening of the aortic valve. Aortic valve regurgitation is trivial. Severe aortic valve stenosis. Comparison(s): No prior Echocardiogram. FINDINGS  Left Ventricle: Left ventricular ejection fraction, by estimation, is 60 to  65%. The left ventricle has normal function. The left ventricle has no regional wall motion abnormalities. The left ventricular internal cavity size was normal in size. There is  moderate concentric left ventricular hypertrophy. Diastolic function indeterminant due to severe MAC. Elevated left atrial pressure. The E/e' is 63. Right Ventricle: The right ventricular size is normal. No increase in right ventricular wall thickness. Right ventricular systolic function is normal. Tricuspid regurgitation signal is inadequate for assessing PA pressure. Left Atrium: Left atrial size was mildly dilated. Right Atrium: Right atrial size was normal in size. Pericardium: There is no evidence of pericardial effusion. Mitral Valve: There is mild calcific mitral stenosis with mean gradient 42mHg at HR 74bpm. MVA by continuity equation is 1.2cm2. The mitral valve is abnormal. There is moderate thickening of the mitral valve leaflet(s). There is moderate calcification of  the mitral valve leaflet(s). Severe mitral annular calcification. Trivial mitral valve regurgitation. MV peak gradient, 25.5 mmHg. The mean mitral valve gradient is 8.0 mmHg. Tricuspid Valve: The tricuspid valve is normal in structure. Tricuspid valve regurgitation is trivial. Aortic Valve: There is severe aortic stenosis with AVA 0.8 by continuity equation, mean gradient 460mg, peak gradient 7032m, Vmax 4.22m69mDI 0.27. The aortic valve is calcified. There is severe calcifcation of the aortic valve. There is severe thickening of the aortic valve. Aortic valve regurgitation is trivial. Severe aortic stenosis is present. Aortic valve mean gradient measures 42.2 mmHg. Aortic valve peak gradient measures 69.6 mmHg. Aortic valve area, by VTI measures 1.29 cm. Pulmonic Valve: The pulmonic valve was not well visualized. Pulmonic valve regurgitation is trivial. Aorta: The aortic root is normal in size and structure. IAS/Shunts: No atrial level shunt detected by color  flow Doppler.  LEFT VENTRICLE PLAX 2D LVIDd:         4.50 cm     Diastology LVIDs:         3.00 cm     LV e' medial:    6.20 cm/s LV PW:         1.60 cm     LV E/e' medial:  26.9 LV IVS:        1.30 cm     LV e' lateral:   5.44 cm/s LVOT diam:     1.90 cm     LV E/e' lateral: 30.7 LV SV:         130 LV SV Index:   68 LVOT Area:     2.84 cm  LV Volumes (MOD) LV vol d, MOD A2C: 58.8 ml LV vol d, MOD A4C: 67.8 ml LV vol s, MOD A2C: 29.8 ml LV vol s, MOD A4C: 21.2 ml LV SV MOD A2C:     29.0 ml LV SV MOD A4C:     67.8 ml LV SV MOD BP:      38.2 ml RIGHT VENTRICLE            IVC RV S prime:     9.46 cm/s  IVC diam: 2.10 cm TAPSE (M-mode): 2.2 cm LEFT ATRIUM             Index       RIGHT ATRIUM           Index LA diam:  4.00 cm 2.09 cm/m  RA Area:     11.00 cm LA Vol (A2C):   41.2 ml 21.49 ml/m RA Volume:   21.10 ml  11.01 ml/m LA Vol (A4C):   47.4 ml 24.72 ml/m LA Biplane Vol: 45.0 ml 23.47 ml/m  AORTIC VALVE AV Area (Vmax):    1.42 cm AV Area (Vmean):   1.30 cm AV Area (VTI):     1.29 cm AV Vmax:           417.25 cm/s AV Vmean:          306.500 cm/s AV VTI:            1.002 m AV Peak Grad:      69.6 mmHg AV Mean Grad:      42.2 mmHg LVOT Vmax:         209.00 cm/s LVOT Vmean:        141.000 cm/s LVOT VTI:          0.457 m LVOT/AV VTI ratio: 0.46  AORTA Ao Root diam: 2.60 cm MITRAL VALVE MV Area (PHT): 4.39 cm     SHUNTS MV Area VTI:   1.89 cm     Systemic VTI:  0.46 m MV Peak grad:  25.5 mmHg    Systemic Diam: 1.90 cm MV Mean grad:  8.0 mmHg MV Vmax:       2.53 m/s MV Vmean:      120.5 cm/s MV Decel Time: 173 msec MV E velocity: 167.00 cm/s MV A velocity: 214.00 cm/s MV E/A ratio:  0.78 Gwyndolyn Kaufman MD Electronically signed by Gwyndolyn Kaufman MD Signature Date/Time: 05/05/2020/4:36:45 PM    Final       Scheduled Meds: . atorvastatin  10 mg Oral QHS  . docusate sodium  100 mg Oral BID  . donepezil  10 mg Oral QHS  . insulin aspart  0-9 Units Subcutaneous TID WC  . memantine  10 mg Oral BID   . mupirocin ointment  1 application Nasal BID  . ramipril  10 mg Oral Daily  . rivaroxaban  20 mg Oral Q supper  . senna  1 tablet Oral BID  . sertraline  50 mg Oral Daily  . sodium chloride flush  3 mL Intravenous Q12H   Continuous Infusions: . sodium chloride Stopped (05/05/20 1015)     LOS: 2 days      Time spent: 25 minutes   Dessa Phi, DO [facility] 05/07/2020, 9:46 AM   Available via Epic secure chat 7am-7pm After these hours, please refer to coverage provider listed on [email or web address]

## 2020-05-08 DIAGNOSIS — S82892A Other fracture of left lower leg, initial encounter for closed fracture: Secondary | ICD-10-CM | POA: Diagnosis not present

## 2020-05-08 DIAGNOSIS — I350 Nonrheumatic aortic (valve) stenosis: Secondary | ICD-10-CM

## 2020-05-08 LAB — CBC
HCT: 28.6 % — ABNORMAL LOW (ref 36.0–46.0)
Hemoglobin: 8.5 g/dL — ABNORMAL LOW (ref 12.0–15.0)
MCH: 22.7 pg — ABNORMAL LOW (ref 26.0–34.0)
MCHC: 29.7 g/dL — ABNORMAL LOW (ref 30.0–36.0)
MCV: 76.3 fL — ABNORMAL LOW (ref 80.0–100.0)
Platelets: 216 10*3/uL (ref 150–400)
RBC: 3.75 MIL/uL — ABNORMAL LOW (ref 3.87–5.11)
RDW: 24.4 % — ABNORMAL HIGH (ref 11.5–15.5)
WBC: 12.8 10*3/uL — ABNORMAL HIGH (ref 4.0–10.5)
nRBC: 0.2 % (ref 0.0–0.2)

## 2020-05-08 LAB — URINE CULTURE: Culture: >=100000 — AB

## 2020-05-08 LAB — GLUCOSE, CAPILLARY
Glucose-Capillary: 231 mg/dL — ABNORMAL HIGH (ref 70–99)
Glucose-Capillary: 289 mg/dL — ABNORMAL HIGH (ref 70–99)
Glucose-Capillary: 105 mg/dL — ABNORMAL HIGH (ref 70–99)
Glucose-Capillary: 100 mg/dL — ABNORMAL HIGH (ref 70–99)

## 2020-05-08 LAB — SARS CORONAVIRUS 2 (TAT 6-24 HRS): SARS Coronavirus 2: NEGATIVE

## 2020-05-08 MED ORDER — HALOPERIDOL 2 MG PO TABS
2.0000 mg | ORAL_TABLET | Freq: Four times a day (QID) | ORAL | Status: DC | PRN
  Administered 2020-05-08: 2 mg via ORAL
  Filled 2020-05-08 (×3): qty 1

## 2020-05-08 MED ORDER — CEPHALEXIN 500 MG PO CAPS
500.0000 mg | ORAL_CAPSULE | Freq: Two times a day (BID) | ORAL | Status: DC
  Administered 2020-05-08 (×2): 500 mg via ORAL
  Filled 2020-05-08 (×2): qty 1

## 2020-05-08 MED ORDER — CEPHALEXIN 500 MG PO CAPS: 500.0000 mg | ORAL_CAPSULE | Freq: Two times a day (BID) | ORAL | 0 refills | Status: AC

## 2020-05-08 MED ORDER — HALOPERIDOL LACTATE 5 MG/ML IJ SOLN: 2.0000 mg | Freq: Four times a day (QID) | INTRAMUSCULAR | Status: DC | PRN

## 2020-05-08 NOTE — TOC Progression Note (Signed)
Transition of Care [facility]) - Progression Note    Patient Details  Name: Denise [name] MRN: [id number] Date of Birth: Dec 19, 1940  Transition of Care [facility]) CM/SW Contact  Teresha Hanks, Olegario Messier, RN Phone Number: 05/08/2020, 10:40 AM  Clinical Narrative: Faxed PT/OT/fl2 to [facility] rep charity to respond if can accept back today. covid to be ordered.      Expected Discharge Plan: Memory Care Barriers to Discharge: Continued Medical Work up  Expected Discharge Plan and Services Expected Discharge Plan: Memory Care   Discharge Planning Services: CM Consult Post Acute Care Choice:  (memory care) Living arrangements for the past 2 months:  (memory care)                                       Social Determinants of Health (SDOH) Interventions    Readmission Risk Interventions No flowsheet data found.

## 2020-05-08 NOTE — Care Management Important Message (Signed)
Important Message  Patient Details IM Letter given to the Patient Name: Denise [name] MRN: [id number] Date of Birth: [birth year]   Medicare Important Message Given:  Yes     Caren Macadam 05/08/2020, 12:08 PM

## 2020-05-08 NOTE — Discharge Summary (Signed)
Physician Discharge Summary  Denise [name] JJO:[id number] DOB: [birth year] DOA: 05/05/2020  PCP: Jearld Fenton, NP  Admit date: 05/05/2020 Discharge date: 05/08/2020  Admitted From: Memory Care Disposition:  Same   Recommendations for Outpatient Follow-up:  1. Follow up with Dr. Mardelle Matte in 2 weeks   Discharge Condition: Stable CODE STATUS: Full  Diet recommendation:  Diet Orders (From admission, onward)    Start     Ordered   05/06/20 1537  Diet regular Room service appropriate? Yes; Fluid consistency: Thin  Diet effective now       Question Answer Comment  Room service appropriate? Yes   Fluid consistency: Thin      05/06/20 1536         Brief/Interim Summary: Denise [name] a 80 y.o.femalewith medical history significant ofdementia, afib on anticoagulation, COPD, DM2 who presented with leg pain after unwitnessed fall. Patient is poor historian due to underlying dementia. Apparently during 3rd shift last night at [facility] facility, it was noted that the patient had fallen and was having difficulty getting up. EMS was called for transfer to ED. She was found to have a left ankle fracture. Orthopedics was consulted.  Echocardiogram revealed severe aortic stenosis, cardiology was consulted who did not have any pre-op testing recommendations.  She remained at high risk for surgical intervention.  She ultimately underwent ORIF by Dr. Mardelle Matte on 4/19 without immediate complication. She was treated for E Coli UTI as well.   Discharge Diagnoses:  Principal Problem:   Ankle fracture, left Active Problems:   HLD (hyperlipidemia)   Essential hypertension   DM type 2 (diabetes mellitus, type 2) (HCC)   Dementia (HCC)   Paroxysmal A-fib (HCC)   Pressure injury of skin   Severe aortic stenosis   Left ankle fracture after unwitnessed fall -Status post ORIF with Dr. Mardelle Matte on 4/19 -Recommended nonweightbearing left lower extremity, keep splint in place -Follow-up  with Dr. Mardelle Matte in 2 weeks -PT OT  Unwitnessed fall -Unclear if syncopal episode versus mechanical fall -Echocardiogram did reveal severe aortic stenosis -Also could be compounded by symptomatic anemia -PT OT   Severe aortic stenosis -Per echocardiogram -Cardiology consulted  Iron deficiency anemia, microcytic anemia -Status post 2 unit packed red blood cell, iron transfusion -Resume Xarelto   E. coli UTI -Rocephin --> Keflex  Diabetes mellitus, well controlled -Hemoglobin A1c 6.5 -Sliding scale insulin  Paroxysmal A. fib -RR today -Continue Xarelto  Dementia -Continue Aricept, Namenda -Resides at memory care facility  Hyperlipidemia -Continue Lipitor  Hypertension -Continue ramipril  Depression/anxiety -Continue Zoloft   In agreement with assessment of the pressure ulcer as below:  Pressure Injury 05/05/20 Buttocks Right Stage 1 -  Intact skin with non-blanchable redness of a localized area usually over a bony prominence. (Active)  05/05/20 1100  Location: Buttocks  Location Orientation: Right  Staging: Stage 1 -  Intact skin with non-blanchable redness of a localized area usually over a bony prominence.  Wound Description (Comments):   Present on Admission: Yes       Discharge Instructions  Discharge Instructions    Increase activity slowly   Complete by: As directed    Leave dressing on - Keep it clean, dry, and intact until clinic visit   Complete by: As directed      Allergies as of 05/08/2020      Reactions   Codeine Other (See Comments)   unknown   Sulfonamide Derivatives Other (See Comments)   unknown  Medication List    STOP taking these medications   glipiZIDE 10 MG tablet Commonly known as: GLUCOTROL   insulin glargine 100 UNIT/ML Solostar Pen Commonly known as: Lantus SoloStar   oxybutynin 15 MG 24 hr tablet Commonly known as: DITROPAN XL   ranitidine 150 MG tablet Commonly known as: ZANTAC   traMADol 50 MG  tablet Commonly known as: ULTRAM     TAKE these medications   Accu-Chek Nano SmartView w/Device Kit by Does not apply route.   acetaminophen 500 MG tablet Commonly known as: TYLENOL Take 500 mg by mouth every 4 (four) hours as needed for mild pain.   albuterol 108 (90 Base) MCG/ACT inhaler Commonly known as: VENTOLIN HFA Inhale 2 puffs into the lungs every 6 (six) hours as needed.   alendronate 70 MG tablet Commonly known as: FOSAMAX TAKE 1 TABLET BY MOUTH ONCE A WEEK WITH FULL GLASS OF WATER ON EMPTY STOMACH   atorvastatin 10 MG tablet Commonly known as: LIPITOR Take 10 mg by mouth at bedtime. What changed: Another medication with the same name was removed. Continue taking this medication, and follow the directions you see here.   BD Pen Needle Nano U/F 32G X 4 MM Misc Generic drug: Insulin Pen Needle USE DAILY AS DIRECTED   calcium carbonate 1500 (600 Ca) MG Tabs tablet Commonly known as: OSCAL Take 600 mg of elemental calcium by mouth 2 (two) times daily with a meal.   cephALEXin 500 MG capsule Commonly known as: KEFLEX Take 1 capsule (500 mg total) by mouth every 12 (twelve) hours for 3 days.   CERTAVITE SENIOR PO Take 1 tablet by mouth daily.   Desitin 13 % Crea Generic drug: Zinc Oxide Apply 1 application topically See admin instructions. Apply to bottom every shift   donepezil 10 MG tablet Commonly known as: ARICEPT Take 10 mg by mouth at bedtime. What changed: Another medication with the same name was removed. Continue taking this medication, and follow the directions you see here.   Fluticasone-Salmeterol 100-50 MCG/DOSE Aepb Commonly known as: ADVAIR Inhale 1 puff into the lungs 2 (two) times daily.   glimepiride 4 MG tablet Commonly known as: AMARYL Take 4 mg by mouth daily with breakfast.   glucose blood test strip Commonly known as: Accu-Chek SmartView 1 each by Other route 2 (two) times daily.   insulin lispro 100 UNIT/ML injection Commonly  known as: HUMALOG Inject 8 Units into the skin every morning.   linagliptin 5 MG Tabs tablet Commonly known as: Tradjenta Take 1 tablet (5 mg total) by mouth daily.   loperamide 2 MG tablet Commonly known as: IMODIUM A-D Take 2 mg by mouth daily as needed for diarrhea or loose stools (max of 4 tablets in 24hrs).   memantine 10 MG tablet Commonly known as: NAMENDA Take 10 mg by mouth 2 (two) times daily. What changed: Another medication with the same name was removed. Continue taking this medication, and follow the directions you see here.   metFORMIN 500 MG tablet Commonly known as: GLUCOPHAGE Take 500 mg by mouth 2 (two) times daily with a meal. What changed: Another medication with the same name was removed. Continue taking this medication, and follow the directions you see here.   ondansetron 4 MG tablet Commonly known as: ZOFRAN Take 4 mg by mouth every 8 (eight) hours as needed for nausea or vomiting.   ramipril 10 MG capsule Commonly known as: ALTACE Take 1 capsule (10 mg total) by mouth daily.  rivaroxaban 20 MG Tabs tablet Commonly known as: XARELTO Take 20 mg by mouth daily with supper.   sertraline 50 MG tablet Commonly known as: ZOLOFT Take 50 mg by mouth daily.            Discharge Care Instructions  (From admission, onward)         Start     Ordered   05/08/20 0000  Leave dressing on - Keep it clean, dry, and intact until clinic visit        05/08/20 1055          Follow-up Information    Marchia Bond, MD. Schedule an appointment as soon as possible for a visit in 2 weeks.   Specialty: Orthopedic Surgery Contact information: [address] [place] [address] [phone]              Allergies  Allergen Reactions  . Codeine Other (See Comments)    unknown  . Sulfonamide Derivatives Other (See Comments)    unknown    Consultations:  Orthopedic surgery  Cardiology    Procedures/Studies: DG Pelvis 1-2  Views  Result Date: 05/05/2020 CLINICAL DATA:  [place]. EXAM: PELVIS - 1-2 VIEW COMPARISON:  03/03/2014 FINDINGS: Both hips are normally located. Stable appearing bilateral hip joint degenerative changes but no definite acute hip fracture. The pubic symphysis and SI joints are intact. Remote healed right pubic rami fractures are noted. No definite acute pelvic fractures. IMPRESSION: 1. No acute bony findings. 2. Remote healed right pubic rami fractures. Electronically Signed   By: Marijo Sanes M.D.   On: 05/05/2020 08:30   DG Ankle Complete Left  Result Date: 05/05/2020 CLINICAL DATA:  [place].  Injured left ankle. EXAM: LEFT ANKLE COMPLETE - 3+ VIEW COMPARISON:  None. FINDINGS: Bimalleolar ankle fractures are noted. There is an oblique minimally displaced fracture involving the lateral malleolus at and above the level of the ankle mortise. There is also a mildly displaced transverse fracture through the base of the medial malleolus at the level of the ankle mortise. No fracture of the posterior malleolus of tibia. The talus is intact. Dense calcification noted posteriorly likely in the Achilles tendon possibly related to a chronic tear. Fairly extensive small vessel calcifications. IMPRESSION: Bimalleolar ankle fractures. Electronically Signed   By: Marijo Sanes M.D.   On: 05/05/2020 08:28   CT Head Wo Contrast  Result Date: 05/05/2020 CLINICAL DATA:  Facial trauma.  Neck trauma.  Unwitnessed fall. EXAM: CT HEAD WITHOUT CONTRAST CT CERVICAL SPINE WITHOUT CONTRAST TECHNIQUE: Multidetector CT imaging of the head and cervical spine was performed following the standard protocol without intravenous contrast. Multiplanar CT image reconstructions of the cervical spine were also generated. COMPARISON:  Head CT 02/09/2015. CT of the cervical spine 03/04/2014. FINDINGS: CT HEAD FINDINGS Brain: Mildly motion degraded exam. Moderate to moderately advanced cerebral atrophy. Comparatively mild cerebellar atrophy. Mild  ill-defined and patchy hypoattenuation within the cerebral white matter is nonspecific, but compatible with chronic small vessel ischemic disease. There is no acute intracranial hemorrhage. No demarcated cortical infarct. No extra-axial fluid collection. No evidence of intracranial mass. No midline shift. Partially empty sella turcica. Vascular: No hyperdense vessel.  Atherosclerotic calcifications Skull: Normal. Negative for fracture or focal lesion. Sinuses/Orbits: Visualized orbits show no acute finding. Mild bilateral ethmoid sinus mucosal thickening. Other: Nonspecific 9 mm polypoid soft tissue focus within the posterior right nasopharynx (series 2, image 5) (series 3, image 8). Trace fluid within the left mastoid air cells. CT  CERVICAL SPINE FINDINGS Mildly motion degraded exam. Alignment: Cervical levocurvature. 2 mm C3-C4 grade 1 retrolisthesis. Trace C7-T1 grade 1 anterolisthesis. Skull base and vertebrae: The basion-dental and atlanto-dental intervals are maintained.No evidence of acute fracture to the cervical spine. Soft tissues and spinal canal: No prevertebral fluid or swelling. No visible canal hematoma. Disc levels: Cervical spondylosis with multilevel disc space narrowing, disc bulges, posterior disc osteophytes, endplate spurring, uncovertebral hypertrophy and facet arthrosis. There is fusion across the C5-C6 disc space as well as facet joint ankylosis on the right at this level. Otherwise, disc space narrowing is greatest at C6-C7 (moderate/advanced). Multilevel spinal canal stenosis. Most notably, calcified disc bulges contribute to moderate/severe spinal canal stenosis at C3-C4 and C4-C5. Multilevel bony neural foraminal narrowing. Degenerative changes are also present at the C1-C2 articulation. Upper chest: No consolidation within the imaged lung apices. Interlobular septal thickening within the imaged lung apices, nonspecific but possibly reflecting edema. Other: Right thyroid lobe nodule  measuring at least 2.1 cm. IMPRESSION: CT head: 1. Mildly motion degraded examination. 2. No evidence of acute intracranial abnormality. 3. Moderate to moderately advanced cerebral atrophy with comparatively mild cerebellar atrophy, progressed as compared to the head CT of 02/09/2015. 4. Mild cerebral white matter chronic small vessel ischemic disease. 5. Nonspecific 9 mm polypoid soft tissue focus within the posterior right nasopharynx. Nonemergent ENT consultation recommended. CT cervical spine: 1. Mildly motion degraded examination. 2. No evidence of acute fracture to the cervical spine. 3. Cervical levocurvature. 4. 2 mm C3-C4 grade 1 retrolisthesis. 5. Trace C7-T1 grade 1 anterolisthesis. 6. Cervical spondylosis as outlined with degenerative appearing fusion at C5-C6. Multilevel spinal canal stenosis. Most notably, there is apparent moderate/severe spinal canal stenosis at C3-C4 and C4-C5. Multilevel bony neural foraminal narrowing. 7. Right thyroid lobe nodule measuring at least 2.1 cm. Nonemergent thyroid ultrasound recommended for further evaluation. Electronically Signed   By: Kellie Simmering DO   On: 05/05/2020 08:57   CT Cervical Spine Wo Contrast  Result Date: 05/05/2020 CLINICAL DATA:  Facial trauma.  Neck trauma.  Unwitnessed fall. EXAM: CT HEAD WITHOUT CONTRAST CT CERVICAL SPINE WITHOUT CONTRAST TECHNIQUE: Multidetector CT imaging of the head and cervical spine was performed following the standard protocol without intravenous contrast. Multiplanar CT image reconstructions of the cervical spine were also generated. COMPARISON:  Head CT 02/09/2015. CT of the cervical spine 03/04/2014. FINDINGS: CT HEAD FINDINGS Brain: Mildly motion degraded exam. Moderate to moderately advanced cerebral atrophy. Comparatively mild cerebellar atrophy. Mild ill-defined and patchy hypoattenuation within the cerebral white matter is nonspecific, but compatible with chronic small vessel ischemic disease. There is no acute  intracranial hemorrhage. No demarcated cortical infarct. No extra-axial fluid collection. No evidence of intracranial mass. No midline shift. Partially empty sella turcica. Vascular: No hyperdense vessel.  Atherosclerotic calcifications Skull: Normal. Negative for fracture or focal lesion. Sinuses/Orbits: Visualized orbits show no acute finding. Mild bilateral ethmoid sinus mucosal thickening. Other: Nonspecific 9 mm polypoid soft tissue focus within the posterior right nasopharynx (series 2, image 5) (series 3, image 8). Trace fluid within the left mastoid air cells. CT CERVICAL SPINE FINDINGS Mildly motion degraded exam. Alignment: Cervical levocurvature. 2 mm C3-C4 grade 1 retrolisthesis. Trace C7-T1 grade 1 anterolisthesis. Skull base and vertebrae: The basion-dental and atlanto-dental intervals are maintained.No evidence of acute fracture to the cervical spine. Soft tissues and spinal canal: No prevertebral fluid or swelling. No visible canal hematoma. Disc levels: Cervical spondylosis with multilevel disc space narrowing, disc bulges, posterior disc osteophytes, endplate spurring, uncovertebral hypertrophy  and facet arthrosis. There is fusion across the C5-C6 disc space as well as facet joint ankylosis on the right at this level. Otherwise, disc space narrowing is greatest at C6-C7 (moderate/advanced). Multilevel spinal canal stenosis. Most notably, calcified disc bulges contribute to moderate/severe spinal canal stenosis at C3-C4 and C4-C5. Multilevel bony neural foraminal narrowing. Degenerative changes are also present at the C1-C2 articulation. Upper chest: No consolidation within the imaged lung apices. Interlobular septal thickening within the imaged lung apices, nonspecific but possibly reflecting edema. Other: Right thyroid lobe nodule measuring at least 2.1 cm. IMPRESSION: CT head: 1. Mildly motion degraded examination. 2. No evidence of acute intracranial abnormality. 3. Moderate to moderately  advanced cerebral atrophy with comparatively mild cerebellar atrophy, progressed as compared to the head CT of 02/09/2015. 4. Mild cerebral white matter chronic small vessel ischemic disease. 5. Nonspecific 9 mm polypoid soft tissue focus within the posterior right nasopharynx. Nonemergent ENT consultation recommended. CT cervical spine: 1. Mildly motion degraded examination. 2. No evidence of acute fracture to the cervical spine. 3. Cervical levocurvature. 4. 2 mm C3-C4 grade 1 retrolisthesis. 5. Trace C7-T1 grade 1 anterolisthesis. 6. Cervical spondylosis as outlined with degenerative appearing fusion at C5-C6. Multilevel spinal canal stenosis. Most notably, there is apparent moderate/severe spinal canal stenosis at C3-C4 and C4-C5. Multilevel bony neural foraminal narrowing. 7. Right thyroid lobe nodule measuring at least 2.1 cm. Nonemergent thyroid ultrasound recommended for further evaluation. Electronically Signed   By: Kellie Simmering DO   On: 05/05/2020 08:57   DG Knee Complete 4 Views Left  Result Date: 05/05/2020 CLINICAL DATA:  [place]. EXAM: LEFT KNEE - COMPLETE 4+ VIEW COMPARISON:  None. FINDINGS: Moderate age related degenerative changes but no acute fracture or osteochondral lesion. Chondrocalcinosis is noted. Small knee joint effusion. Vascular calcifications noted. Remote healed proximal fibular fractures noted. IMPRESSION: 1. No acute bony findings. 2. Small knee joint effusion. 3. 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 Electronically Signed   By: Marijo Sanes M.D.   On: 05/05/2020 08:31   ECHOCARDIOGRAM COMPLETE  Result Date: 05/05/2020    ECHOCARDIOGRAM REPORT   Patient Name:   Denise [name] Date of Exam: 05/05/2020 Medical Rec #:  [id number]       Height:       63.0 in Accession #:    [id number]      Weight:       196.0 lb Date of Birth:  1940-07-30       BSA:          1.917 m Patient Age:    59 years        BP:           123/51 mmHg Patient Gender: F                HR:           69 bpm. Exam Location:  Inpatient Procedure: 2D Echo, 3D Echo, Cardiac Doppler and Color Doppler Indications:    R55 Syncope  History:        Patient has no prior history of Echocardiogram examinations.                 Abnormal ECG, Arrythmias:Atrial Fibrillation,                 Signs/Symptoms:Alzheimer's and Altered Mental Status; Risk                 Factors:Hypertension, Diabetes and Dyslipidemia.  Sonographer:    Roseanna Rainbow  RDCS Referring Phys: [id number] Jonnie Finner  Sonographer Comments: Technically difficult study due to poor echo windows. Image acquisition challenging due to uncooperative patient. Patient supine and pushed my hand away at the end of study. IMPRESSIONS  1. There is severe aortic stenosis with AVA 0.8 by continuity equation, mean gradient 46mHg, peak gradient 755mg, Vmax 4.1729m DI 0.27.  2. There is mild calcific mitral stenosis with mean gradient 8mm47mat HR 74bpm. MVA by continuity equation is 1.2cm2  3. Left ventricular ejection fraction, by estimation, is 60 to 65%. The left ventricle has normal function. The left ventricle has no regional wall motion abnormalities. There is moderate concentric left ventricular hypertrophy. Diastolic function indeterminant due to severe MAC. Elevated left atrial pressure.  4. Right ventricular systolic function is normal. The right ventricular size is normal. Tricuspid regurgitation signal is inadequate for assessing PA pressure.  5. Left atrial size was mildly dilated.  6. The mitral valve is abnormal. Trivial mitral valve regurgitation. Severe mitral annular calcification.  7. The aortic valve is calcified. There is severe calcifcation of the aortic valve. There is severe thickening of the aortic valve. Aortic valve regurgitation is trivial. Severe aortic valve stenosis. Comparison(s): No prior Echocardiogram. FINDINGS  Left Ventricle: Left ventricular ejection fraction, by estimation, is 60 to 65%. The left ventricle has normal  function. The left ventricle has no regional wall motion abnormalities. The left ventricular internal cavity size was normal in size. There is  moderate concentric left ventricular hypertrophy. Diastolic function indeterminant due to severe MAC. Elevated left atrial pressure. The E/e' is 27. 5ght Ventricle: The right ventricular size is normal. No increase in right ventricular wall thickness. Right ventricular systolic function is normal. Tricuspid regurgitation signal is inadequate for assessing PA pressure. Left Atrium: Left atrial size was mildly dilated. Right Atrium: Right atrial size was normal in size. Pericardium: There is no evidence of pericardial effusion. Mitral Valve: There is mild calcific mitral stenosis with mean gradient 8mmH22mt HR 74bpm. MVA by continuity equation is 1.2cm2. The mitral valve is abnormal. There is moderate thickening of the mitral valve leaflet(s). There is moderate calcification of  the mitral valve leaflet(s). Severe mitral annular calcification. Trivial mitral valve regurgitation. MV peak gradient, 25.5 mmHg. The mean mitral valve gradient is 8.0 mmHg. Tricuspid Valve: The tricuspid valve is normal in structure. Tricuspid valve regurgitation is trivial. Aortic Valve: There is severe aortic stenosis with AVA 0.8 by continuity equation, mean gradient 40mmH80meak gradient 70mmHg34max 4.46m/s, 5m.27. The aortic valve is calcified. There is severe calcifcation of the aortic valve. There is severe thickening of the aortic valve. Aortic valve regurgitation is trivial. Severe aortic stenosis is present. Aortic valve mean gradient measures 42.2 mmHg. Aortic valve peak gradient measures 69.6 mmHg. Aortic valve area, by VTI measures 1.29 cm. Pulmonic Valve: The pulmonic valve was not well visualized. Pulmonic valve regurgitation is trivial. Aorta: The aortic root is normal in size and structure. IAS/Shunts: No atrial level shunt detected by color flow Doppler.  LEFT VENTRICLE PLAX 2D  LVIDd:         4.50 cm     Diastology LVIDs:         3.00 cm     LV e' medial:    6.20 cm/s LV PW:         1.60 cm     LV E/e' medial:  26.9 LV IVS:        1.30 cm     LV e'  lateral:   5.44 cm/s LVOT diam:     1.90 cm     LV E/e' lateral: 30.7 LV SV:         130 LV SV Index:   68 LVOT Area:     2.84 cm  LV Volumes (MOD) LV vol d, MOD A2C: 58.8 ml LV vol d, MOD A4C: 67.8 ml LV vol s, MOD A2C: 29.8 ml LV vol s, MOD A4C: 21.2 ml LV SV MOD A2C:     29.0 ml LV SV MOD A4C:     67.8 ml LV SV MOD BP:      38.2 ml RIGHT VENTRICLE            IVC RV S prime:     9.46 cm/s  IVC diam: 2.10 cm TAPSE (M-mode): 2.2 cm LEFT ATRIUM             Index       RIGHT ATRIUM           Index LA diam:        4.00 cm 2.09 cm/m  RA Area:     11.00 cm LA Vol (A2C):   41.2 ml 21.49 ml/m RA Volume:   21.10 ml  11.01 ml/m LA Vol (A4C):   47.4 ml 24.72 ml/m LA Biplane Vol: 45.0 ml 23.47 ml/m  AORTIC VALVE AV Area (Vmax):    1.42 cm AV Area (Vmean):   1.30 cm AV Area (VTI):     1.29 cm AV Vmax:           417.25 cm/s AV Vmean:          306.500 cm/s AV VTI:            1.002 m AV Peak Grad:      69.6 mmHg AV Mean Grad:      42.2 mmHg LVOT Vmax:         209.00 cm/s LVOT Vmean:        141.000 cm/s LVOT VTI:          0.457 m LVOT/AV VTI ratio: 0.46  AORTA Ao Root diam: 2.60 cm MITRAL VALVE MV Area (PHT): 4.39 cm     SHUNTS MV Area VTI:   1.89 cm     Systemic VTI:  0.46 m MV Peak grad:  25.5 mmHg    Systemic Diam: 1.90 cm MV Mean grad:  8.0 mmHg MV Vmax:       2.53 m/s MV Vmean:      120.5 cm/s MV Decel Time: 173 msec MV E velocity: 167.00 cm/s MV A velocity: 214.00 cm/s MV E/A ratio:  0.78 Gwyndolyn Kaufman MD Electronically signed by Gwyndolyn Kaufman MD Signature Date/Time: 05/05/2020/4:36:45 PM    Final        Discharge Exam: Vitals:   05/07/20 2303 05/08/20 0436  BP: (!) 121/42 (!) 121/50  Pulse: 61 63  Resp: 16 15  Temp: 98.6 F (37 C) 97.8 F (36.6 C)  SpO2: 94% 95%    General: Pt is alert, awake, not in acute  distress Cardiovascular: RRR, S1/S2 +, no edema Respiratory: CTA bilaterally, no wheezing, no rhonchi, no respiratory distress, no conversational dyspnea  Abdominal: Soft, NT, ND, bowel sounds + Extremities: no edema, no cyanosis Psych: Normal mood and affect, poor judgement and insight, pleasantly confused     The results of significant diagnostics from this hospitalization (including imaging, microbiology, ancillary and laboratory) are listed below for reference.     Microbiology: Recent Results (  from the past 240 hour(s))  Resp Panel by RT-PCR (Flu A&B, Covid) Nasopharyngeal Swab     Status: None   Collection Time: 05/05/20  9:01 AM   Specimen: Nasopharyngeal Swab; Nasopharyngeal(NP) swabs in vial transport medium  Result Value Ref Range Status   SARS Coronavirus 2 by RT PCR NEGATIVE NEGATIVE Final    Comment: (NOTE) SARS-CoV-2 target nucleic acids are NOT DETECTED.  The SARS-CoV-2 RNA is generally detectable in upper respiratory specimens during the acute phase of infection. The lowest concentration of SARS-CoV-2 viral copies this assay can detect is 138 copies/mL. A negative result does not preclude SARS-Cov-2 infection and should not be used as the sole basis for treatment or other patient management decisions. A negative result may occur with  improper specimen collection/handling, submission of specimen other than nasopharyngeal swab, presence of viral mutation(s) within the areas targeted by this assay, and inadequate number of viral copies(<138 copies/mL). A negative result must be combined with clinical observations, patient history, and epidemiological information. The expected result is Negative.  Fact Sheet for Patients:  EntrepreneurPulse.com.au  Fact Sheet for Healthcare Providers:  IncredibleEmployment.be  This test is no t yet approved or cleared by the [place] FDA and  has been authorized for detection and/or  diagnosis of SARS-CoV-2 by FDA under an Emergency Use Authorization (EUA). This EUA will remain  in effect (meaning this test can be used) for the duration of the COVID-19 declaration under Section 564(b)(1) of the Act, 21 U.S.C.section 360bbb-3(b)(1), unless the authorization is terminated  or revoked sooner.       Influenza A by PCR NEGATIVE NEGATIVE Final   Influenza B by PCR NEGATIVE NEGATIVE Final    Comment: (NOTE) The Xpert Xpress SARS-CoV-2/FLU/RSV plus assay is intended as an aid in the diagnosis of influenza from Nasopharyngeal swab specimens and should not be used as a sole basis for treatment. Nasal washings and aspirates are unacceptable for Xpert Xpress SARS-CoV-2/FLU/RSV testing.  Fact Sheet for Patients: EntrepreneurPulse.com.au  Fact Sheet for Healthcare Providers: IncredibleEmployment.be  This test is not yet approved or cleared by the [place] FDA and has been authorized for detection and/or diagnosis of SARS-CoV-2 by FDA under an Emergency Use Authorization (EUA). This EUA will remain in effect (meaning this test can be used) for the duration of the COVID-19 declaration under Section 564(b)(1) of the Act, 21 U.S.C. section 360bbb-3(b)(1), unless the authorization is terminated or revoked.  Performed at [facility], [place] [address], [place], [place] [address]   MRSA PCR Screening     Status: None   Collection Time: 05/05/20  3:23 PM   Specimen: Nasal Mucosa; Nasopharyngeal  Result Value Ref Range Status   MRSA by PCR NEGATIVE NEGATIVE Final    Comment:        The GeneXpert MRSA Assay (FDA approved for NASAL specimens only), is one component of a comprehensive MRSA colonization surveillance program. It is not intended to diagnose MRSA infection nor to guide or monitor treatment for MRSA infections. Performed at [facility], [place] [address] [place], [place] [address]    Surgical pcr screen     Status: Abnormal   Collection Time: 05/05/20  3:23 PM   Specimen: Nasal Mucosa; Nasal Swab  Result Value Ref Range Status   MRSA, PCR NEGATIVE NEGATIVE Final   Staphylococcus aureus POSITIVE (A) NEGATIVE Final    Comment: (NOTE) The Xpert SA Assay (FDA approved for NASAL specimens in patients 55 years of age and older),  is one component of a comprehensive surveillance program. It is not intended to diagnose infection nor to guide or monitor treatment. Performed at [facility], [place] [address], [place], [place] [address]   Culture, Urine     Status: Abnormal   Collection Time: 05/06/20  7:29 AM   Specimen: Urine, Catheterized  Result Value Ref Range Status   Specimen Description   Final    URINE, CATHETERIZED Performed at [place] [address] [place], [place] [address]    Special Requests   Final    NONE Performed at [facility], [place] [address], [place], [place] [address]    Culture >=100,000 COLONIES/mL ESCHERICHIA COLI (A)  Final   Report Status 05/08/2020 FINAL  Final   Organism ID, Bacteria ESCHERICHIA COLI (A)  Final      Susceptibility   Escherichia coli - MIC*    AMPICILLIN <=2 SENSITIVE Sensitive     CEFAZOLIN <=4 SENSITIVE Sensitive     CEFEPIME <=0.12 SENSITIVE Sensitive     CEFTRIAXONE <=0.25 SENSITIVE Sensitive     CIPROFLOXACIN <=0.25 SENSITIVE Sensitive     GENTAMICIN <=1 SENSITIVE Sensitive     IMIPENEM <=0.25 SENSITIVE Sensitive     NITROFURANTOIN <=16 SENSITIVE Sensitive     TRIMETH/SULFA <=20 SENSITIVE Sensitive     AMPICILLIN/SULBACTAM <=2 SENSITIVE Sensitive     PIP/TAZO <=4 SENSITIVE Sensitive     * >=100,000 COLONIES/mL ESCHERICHIA COLI     Labs: BNP (last 3 results) No results for input(s): BNP in the last 8760 hours. Basic Metabolic Panel: Recent Labs  Lab 05/05/20 0901 05/06/20 0046  NA 139 139  K 4.0 3.5  CL 107 112*  CO2 22 19*  GLUCOSE 198*  124*  BUN 15 10  CREATININE 0.57 0.60  CALCIUM 8.9 8.4*   Liver Function Tests: Recent Labs  Lab 05/06/20 0046  AST 19  ALT 14  ALKPHOS 41  BILITOT 1.7*  PROT 5.7*  ALBUMIN 3.2*   No results for input(s): LIPASE, AMYLASE in the last 168 hours. No results for input(s): AMMONIA in the last 168 hours. CBC: Recent Labs  Lab 05/05/20 0901 05/05/20 1955 05/06/20 0046 05/06/20 1515 05/07/20 0440 05/08/20 0501  WBC 13.1*  --  12.0*  --  10.0 12.8*  NEUTROABS 11.6*  --   --   --   --   --   HGB 6.6* 8.2* 7.6*  7.7* 8.9* 9.6* 8.5*  HCT 24.1* 27.7* 26.2*  26.5* 30.1* 34.5* 28.6*  MCV 67.9*  --  72.0*  --  80.2 76.3*  PLT 305  --  243  --  218 216   Cardiac Enzymes: No results for input(s): CKTOTAL, CKMB, CKMBINDEX, TROPONINI in the last 168 hours. BNP: Invalid input(s): POCBNP CBG: Recent Labs  Lab 05/07/20 0743 05/07/20 1125 05/07/20 1559 05/07/20 2139 05/08/20 0737  GLUCAP 177* 275* 179* 193* 231*   D-Dimer No results for input(s): DDIMER in the last 72 hours. Hgb A1c Recent Labs    05/05/20 1409  HGBA1C 6.5*   Lipid Profile No results for input(s): CHOL, HDL, LDLCALC, TRIG, CHOLHDL, LDLDIRECT in the last 72 hours. Thyroid function studies No results for input(s): TSH, T4TOTAL, T3FREE, THYROIDAB in the last 72 hours.  Invalid input(s): FREET3 Anemia work up Recent Labs    05/05/20 1131  VITAMINB12 657  FOLATE 43.5  TIBC 443  IRON 7*   Urinalysis    Component Value Date/Time   COLORURINE YELLOW 05/05/2020 1348  APPEARANCEUR CLOUDY (A) 05/05/2020 1348   LABSPEC 1.015 05/05/2020 1348   PHURINE 6.0 05/05/2020 1348   GLUCOSEU 150 (A) 05/05/2020 1348   HGBUR SMALL (A) 05/05/2020 1348   BILIRUBINUR NEGATIVE 05/05/2020 1348   BILIRUBINUR neg 04/21/2015 1703   KETONESUR 20 (A) 05/05/2020 1348   PROTEINUR NEGATIVE 05/05/2020 1348   UROBILINOGEN negative 04/21/2015 1703   UROBILINOGEN 1.0 03/13/2014 1854   NITRITE POSITIVE (A) 05/05/2020 1348    LEUKOCYTESUR LARGE (A) 05/05/2020 1348   Sepsis Labs Invalid input(s): PROCALCITONIN,  WBC,  LACTICIDVEN Microbiology Recent Results (from the past 240 hour(s))  Resp Panel by RT-PCR (Flu A&B, Covid) Nasopharyngeal Swab     Status: None   Collection Time: 05/05/20  9:01 AM   Specimen: Nasopharyngeal Swab; Nasopharyngeal(NP) swabs in vial transport medium  Result Value Ref Range Status   SARS Coronavirus 2 by RT PCR NEGATIVE NEGATIVE Final    Comment: (NOTE) SARS-CoV-2 target nucleic acids are NOT DETECTED.  The SARS-CoV-2 RNA is generally detectable in upper respiratory specimens during the acute phase of infection. The lowest concentration of SARS-CoV-2 viral copies this assay can detect is 138 copies/mL. A negative result does not preclude SARS-Cov-2 infection and should not be used as the sole basis for treatment or other patient management decisions. A negative result may occur with  improper specimen collection/handling, submission of specimen other than nasopharyngeal swab, presence of viral mutation(s) within the areas targeted by this assay, and inadequate number of viral copies(<138 copies/mL). A negative result must be combined with clinical observations, patient history, and epidemiological information. The expected result is Negative.  Fact Sheet for Patients:  EntrepreneurPulse.com.au  Fact Sheet for Healthcare Providers:  IncredibleEmployment.be  This test is no t yet approved or cleared by the [place] FDA and  has been authorized for detection and/or diagnosis of SARS-CoV-2 by FDA under an Emergency Use Authorization (EUA). This EUA will remain  in effect (meaning this test can be used) for the duration of the COVID-19 declaration under Section 564(b)(1) of the Act, 21 U.S.C.section 360bbb-3(b)(1), unless the authorization is terminated  or revoked sooner.       Influenza A by PCR NEGATIVE NEGATIVE Final   Influenza B  by PCR NEGATIVE NEGATIVE Final    Comment: (NOTE) The Xpert Xpress SARS-CoV-2/FLU/RSV plus assay is intended as an aid in the diagnosis of influenza from Nasopharyngeal swab specimens and should not be used as a sole basis for treatment. Nasal washings and aspirates are unacceptable for Xpert Xpress SARS-CoV-2/FLU/RSV testing.  Fact Sheet for Patients: EntrepreneurPulse.com.au  Fact Sheet for Healthcare Providers: IncredibleEmployment.be  This test is not yet approved or cleared by the [place] FDA and has been authorized for detection and/or diagnosis of SARS-CoV-2 by FDA under an Emergency Use Authorization (EUA). This EUA will remain in effect (meaning this test can be used) for the duration of the COVID-19 declaration under Section 564(b)(1) of the Act, 21 U.S.C. section 360bbb-3(b)(1), unless the authorization is terminated or revoked.  Performed at [facility], [place] [address], [place], [place] [address]   MRSA PCR Screening     Status: None   Collection Time: 05/05/20  3:23 PM   Specimen: Nasal Mucosa; Nasopharyngeal  Result Value Ref Range Status   MRSA by PCR NEGATIVE NEGATIVE Final    Comment:        The GeneXpert MRSA Assay (FDA approved for NASAL specimens only), is one component of a comprehensive MRSA colonization surveillance program. It is not  intended to diagnose MRSA infection nor to guide or monitor treatment for MRSA infections. Performed at [facility], [place] [address], [place], [place] [address]   Surgical pcr screen     Status: Abnormal   Collection Time: 05/05/20  3:23 PM   Specimen: Nasal Mucosa; Nasal Swab  Result Value Ref Range Status   MRSA, PCR NEGATIVE NEGATIVE Final   Staphylococcus aureus POSITIVE (A) NEGATIVE Final    Comment: (NOTE) The Xpert SA Assay (FDA approved for NASAL specimens in patients 49 years of age and older), is one component of a  comprehensive surveillance program. It is not intended to diagnose infection nor to guide or monitor treatment. Performed at [facility], [place] [address], [place], [place] [address]   Culture, Urine     Status: Abnormal   Collection Time: 05/06/20  7:29 AM   Specimen: Urine, Catheterized  Result Value Ref Range Status   Specimen Description   Final    URINE, CATHETERIZED Performed at [place] [address], [place], [place] [address]    Special Requests   Final    NONE Performed at [facility], [place] [address], [place],  [address]    Culture >=100,000 COLONIES/mL ESCHERICHIA COLI (A)  Final   Report Status 05/08/2020 FINAL  Final   Organism ID, Bacteria ESCHERICHIA COLI (A)  Final      Susceptibility   Escherichia coli - MIC*    AMPICILLIN <=2 SENSITIVE Sensitive     CEFAZOLIN <=4 SENSITIVE Sensitive     CEFEPIME <=0.12 SENSITIVE Sensitive     CEFTRIAXONE <=0.25 SENSITIVE Sensitive     CIPROFLOXACIN <=0.25 SENSITIVE Sensitive     GENTAMICIN <=1 SENSITIVE Sensitive     IMIPENEM <=0.25 SENSITIVE Sensitive     NITROFURANTOIN <=16 SENSITIVE Sensitive     TRIMETH/SULFA <=20 SENSITIVE Sensitive     AMPICILLIN/SULBACTAM <=2 SENSITIVE Sensitive     PIP/TAZO <=4 SENSITIVE Sensitive     * >=100,000 COLONIES/mL ESCHERICHIA COLI     Patient was seen and examined on the day of discharge and was found to be in stable condition. Time coordinating discharge: 35 minutes including assessment and coordination of care, as well as examination of the patient.   SIGNED:  Dessa Phi, DO [facility] 05/08/2020, 10:56 AM

## 2020-05-08 NOTE — Progress Notes (Signed)
     Subjective: 2 Days Post-Op s/p Procedure(s): OPEN REDUCTION INTERNAL FIXATION (ORIF) BIMALLEOLAR LEFT  ANKLE FRACTURE   Pleasantly confused. No complaints this morning.     Objective:  PE: VITALS:   Vitals:   05/07/20 1452 05/07/20 1727 05/07/20 2303 05/08/20 0436  BP: (!) 134/45 (!) 119/57 (!) 121/42 (!) 121/50  Pulse: 64 62 61 63  Resp: 20 20 16 15   Temp: 97.8 F (36.6 C) 98.7 F (37.1 C) 98.6 F (37 C) 97.8 F (36.6 C)  TempSrc: Axillary Oral  Oral  SpO2: 99% 95% 94% 95%  Weight:    81 kg  Height:       Gen: sitting up in bed in no acute distress, oriented to person only Resp: no use of accessory musculature GI: abdomen soft and nontender MSK: - LLE - short leg splint in place. Able to wiggle all toes of left foot. Endorses sensation to toes. Capillary refill intact.   LABS  Results for orders placed or performed during the hospital encounter of 05/05/20 (from the past 24 hour(s))  Glucose, capillary     Status: Abnormal   Collection Time: 05/07/20  7:43 AM  Result Value Ref Range   Glucose-Capillary 177 (H) 70 - 99 mg/dL  Glucose, capillary     Status: Abnormal   Collection Time: 05/07/20 11:25 AM  Result Value Ref Range   Glucose-Capillary 275 (H) 70 - 99 mg/dL  Glucose, capillary     Status: Abnormal   Collection Time: 05/07/20  3:59 PM  Result Value Ref Range   Glucose-Capillary 179 (H) 70 - 99 mg/dL  Glucose, capillary     Status: Abnormal   Collection Time: 05/07/20  9:39 PM  Result Value Ref Range   Glucose-Capillary 193 (H) 70 - 99 mg/dL  CBC     Status: Abnormal   Collection Time: 05/08/20  5:01 AM  Result Value Ref Range   WBC 12.8 (H) 4.0 - 10.5 K/uL   RBC 3.75 (L) 3.87 - 5.11 MIL/uL   Hemoglobin 8.5 (L) 12.0 - 15.0 g/dL   HCT 05/10/20 (L) 35.7 - 01.7 %   MCV 76.3 (L) 80.0 - 100.0 fL   MCH 22.7 (L) 26.0 - 34.0 pg   MCHC 29.7 (L) 30.0 - 36.0 g/dL   RDW 79.3 (H) 90.3 - 00.9 %   Platelets 216 150 - 400 K/uL   nRBC 0.2 0.0 - 0.2 %    No  results found.  Assessment/Plan: Left bimalleolar ankle fracture 2 Days Post-Op s/p Procedure(s): OPEN REDUCTION INTERNAL FIXATION (ORIF) BIMALLEOLAR LEFT  ANKLE FRACTURE  Weightbearing: NWB LLE Orthopedic device(s): Splint, keep in place VTE prophylaxis: Home xarelto started yesterday Pain control: tylenol, limit narcotics as much as possible Follow - up plan: 2 weeks with Dr. 23.3 Dispo: ok to discharge from orthopedic standpoint back to memory care with PT services vs. SNF when medically ready Contact information:   Weekdays 8-5 09-21-2005, PA-C [phone] A fter hours and holidays please check [email or web address] for group call information for [facility]  [phone] 05/08/2020, 7:33 AM

## 2020-05-08 NOTE — TOC Transition Note (Signed)
Transition of Care [facility]) - CM/SW Discharge Note   Patient Details  Name: [name] MRN: [id number] Date of Birth: 12/23/1940  Transition of Care [facility] CM/SW Contact:  Lanier Clam, RN Phone Number: 05/08/2020, 12:03 PM   Clinical Narrative: Faxed all info to [facility] rep charity-fax#[phone];rep charity able to accept back-going to rm#[id number],nsg call report tel#[phone]. Await covid results prior PTAR.      Final next level of care: Memory Care Barriers to Discharge: No Barriers Identified   Patient Goals and CMS Choice Patient states their goals for this hospitalization and ongoing recovery are:: return back to [facility] memory care CMS Medicare.gov Compare Post Acute Care list provided to:: Legal Guardian (co guardian Rosalie Gums [phone]) Choice offered to / list presented to : [facility] POA / Guardian  Discharge Placement              Patient chooses bed at: [facility] Patient to be transferred to facility by: PTAR Name of family member notified: Hilda Lias [phone] Patient and family notified of of transfer: 05/08/20  Discharge Plan and Services   Discharge Planning Services: CM Consult Post Acute Care Choice:  (memory care)                               Social Determinants of Health (SDOH) Interventions     Readmission Risk Interventions No flowsheet data found.

## 2020-05-08 NOTE — NC FL2 (Signed)
[place] MEDICAID FL2 LEVEL OF CARE SCREENING TOOL     IDENTIFICATION  Patient Name: Denise [name] Birthdate: 03-27-1940 Sex: female Admission Date (Current Location): 05/05/2020  [facility] and [place] Number:  Producer, television/film/video and Address:  [facility],  [address] [place], [place] [address]      Provider Number: [id number]  Attending Physician Name and Address:  Noralee Stain, DO  Relative Name and Phone Number:  Rosalie Gums( co guardian) [phone]    Current Level of Care: Hospital Recommended Level of Care: Memory Care Prior Approval Number:    Date Approved/Denied:   PASRR Number:    Discharge Plan: Other (Comment) (memory care)    Current Diagnoses: Patient Active Problem List   Diagnosis Date Noted  . Ankle fracture, left 05/05/2020  . Pressure injury of skin 05/05/2020  . OAB (overactive bladder) 10/25/2014  . Paroxysmal a-fib (HCC) 10/25/2014  . Obesity (BMI 30-39.9) 06/08/2013  . Dementia (HCC) 06/08/2013  . DM type 2 (diabetes mellitus, type 2) (HCC) 01/05/2010  . Osteoporosis 04/08/2008  . CARPAL TUNNEL SYNDROME, RIGHT 08/22/2006  . HLD (hyperlipidemia) 08/19/2006  . Essential hypertension 08/19/2006  . Mild intermittent asthma 08/09/2006  . GERD 08/09/2006  . Osteoarthritis 08/09/2006    Orientation RESPIRATION BLADDER Height & Weight     Self  Normal Incontinent Weight: 81 kg Height:  5\' 1"  (154.9 cm)  BEHAVIORAL SYMPTOMS/MOOD NEUROLOGICAL BOWEL NUTRITION STATUS      Incontinent Diet (Regular)  AMBULATORY STATUS COMMUNICATION OF NEEDS Skin   Limited Assist Verbally Surgical wounds (L ankle surgical incision site)                       Personal Care Assistance Level of Assistance  Bathing,Feeding,Dressing Bathing Assistance: Limited assistance Feeding assistance: Limited assistance Dressing Assistance: Limited assistance     Functional Limitations Info  Sight,Hearing,Speech Sight Info: Impaired  (eyeglasses) Hearing Info: Adequate Speech Info: Adequate    SPECIAL CARE FACTORS FREQUENCY  PT (By licensed PT),OT (By licensed OT)     PT Frequency: 2/ week OT Frequency: 2/ week            Contractures Contractures Info: Not present    Additional Factors Info  Code Status,Allergies,Psychotropic,Insulin Sliding Scale Code Status Info:  (Full) Allergies Info:  (Codeine;sulfonamides) Psychotropic Info:  (aricept,haldol see MAR) Insulin Sliding Scale Info:  (SSI)       Current Medications (05/08/2020):  This is the current hospital active medication list Current Facility-Administered Medications  Medication Dose Route Frequency Provider Last Rate Last Admin  . 0.9 %  sodium chloride infusion   Intravenous Continuous 05/10/2020 K, PA-C   Stopped at 05/05/20 1015  . acetaminophen (TYLENOL) tablet 650 mg  650 mg Oral Q6H PRN 05/07/20, PA-C   650 mg at 05/08/20 0451   Or  . acetaminophen (TYLENOL) suppository 650 mg  650 mg Rectal Q6H PRN 05/10/20 K, PA-C      . atorvastatin (LIPITOR) tablet 10 mg  10 mg Oral QHS Janine Ores K, PA-C   10 mg at 05/07/20 2157  . bisacodyl (DULCOLAX) suppository 10 mg  10 mg Rectal Daily PRN 2158 K, PA-C      . cephALEXin (KEFLEX) capsule 500 mg  500 mg Oral Q12H Janine Ores, DO      . diphenhydrAMINE (BENADRYL) 12.5 MG/5ML elixir 12.5-25 mg  12.5-25 mg Oral Q4H PRN 02-18-1984 K, PA-C      .  docusate sodium (COLACE) capsule 100 mg  100 mg Oral BID Janine Ores K, PA-C   100 mg at 05/08/20 0818  . donepezil (ARICEPT) tablet 10 mg  10 mg Oral QHS Janine Ores K, PA-C   10 mg at 05/07/20 2157  . haloperidol (HALDOL) tablet 2 mg  2 mg Oral Q6H PRN Noralee Stain, DO       Or  . haloperidol lactate (HALDOL) injection 2 mg  2 mg Intravenous Q6H PRN Noralee Stain, DO      . HYDROcodone-acetaminophen (NORCO/VICODIN) 5-325 MG per tablet 1 tablet  1 tablet Oral Q6H PRN Janine Ores K, PA-C      . insulin aspart (novoLOG)  injection 0-9 Units  0-9 Units Subcutaneous TID WC Janine Ores K, PA-C   3 Units at 05/08/20 0817  . insulin aspart (novoLOG) injection 3 Units  3 Units Subcutaneous TID WC Noralee Stain, DO   3 Units at 05/08/20 0818  . magnesium citrate solution 1 Bottle  1 Bottle Oral Once PRN Janine Ores K, PA-C      . memantine [facility]) tablet 10 mg  10 mg Oral BID Janine Ores K, PA-C   10 mg at 05/08/20 0819  . metoCLOPramide (REGLAN) tablet 5-10 mg  5-10 mg Oral Q8H PRN Janine Ores K, PA-C       Or  . metoCLOPramide (REGLAN) injection 5-10 mg  5-10 mg Intravenous Q8H PRN Janine Ores K, PA-C      . mupirocin ointment (BACTROBAN) 2 % 1 application  1 application Nasal BID Armida Sans, PA-C   1 application at 05/08/20 0814  . ondansetron (ZOFRAN) tablet 4 mg  4 mg Oral Q6H PRN Janine Ores K, PA-C       Or  . ondansetron [facility]) injection 4 mg  4 mg Intravenous Q6H PRN Janine Ores K, PA-C      . polyethylene glycol (MIRALAX / GLYCOLAX) packet 17 g  17 g Oral Daily PRN Janine Ores K, PA-C      . ramipril (ALTACE) capsule 10 mg  10 mg Oral Daily Janine Ores K, PA-C   10 mg at 05/08/20 0819  . rivaroxaban (XARELTO) tablet 20 mg  20 mg Oral Q supper Lucia Gaskins, RPH   20 mg at 05/07/20 1652  . senna (SENOKOT) tablet 8.6 mg  1 tablet Oral BID Janine Ores K, PA-C   8.6 mg at 05/08/20 0818  . sertraline (ZOLOFT) tablet 50 mg  50 mg Oral Daily Janine Ores K, PA-C   50 mg at 05/08/20 4818  . sodium chloride flush (NS) 0.9 % injection 3 mL  3 mL Intravenous Q12H Armida Sans, PA-C   3 mL at 05/07/20 2157     Discharge Medications: Please see discharge summary for a list of discharge medications.  Relevant Imaging Results:  Relevant Lab Results:   Additional Information [phone]  Jaymin Waln, Olegario Messier, [place]

## 2020-05-09 NOTE — Progress Notes (Signed)
PTAR has been called for transport. 

## 2020-05-14 ENCOUNTER — Emergency Department (HOSPITAL_COMMUNITY): Payer: Medicare PPO

## 2020-05-14 ENCOUNTER — Emergency Department (HOSPITAL_COMMUNITY)
Admission: EM | Admit: 2020-05-14 | Discharge: 2020-05-15 | Disposition: A | Discharge status: Home / Self Care | Payer: Medicare PPO | Attending: Emergency Medicine | Admitting: Emergency Medicine

## 2020-05-14 DIAGNOSIS — S59901A Unspecified injury of right elbow, initial encounter: Secondary | ICD-10-CM | POA: Diagnosis present

## 2020-05-14 DIAGNOSIS — W19XXXA Unspecified fall, initial encounter: Secondary | ICD-10-CM | POA: Insufficient documentation

## 2020-05-14 DIAGNOSIS — M25551 Pain in right hip: Secondary | ICD-10-CM | POA: Diagnosis not present

## 2020-05-14 DIAGNOSIS — T1490XA Injury, unspecified, initial encounter: Secondary | ICD-10-CM

## 2020-05-14 DIAGNOSIS — S0083XA Contusion of other part of head, initial encounter: Secondary | ICD-10-CM | POA: Diagnosis not present

## 2020-05-14 DIAGNOSIS — S51011A Laceration without foreign body of right elbow, initial encounter: Secondary | ICD-10-CM | POA: Diagnosis not present

## 2020-05-14 DIAGNOSIS — Z23 Encounter for immunization: Secondary | ICD-10-CM | POA: Insufficient documentation

## 2020-05-14 DIAGNOSIS — F0390 Unspecified dementia without behavioral disturbance: Secondary | ICD-10-CM | POA: Diagnosis not present

## 2020-05-14 NOTE — ED Provider Notes (Signed)
[facility] EMERGENCY DEPARTMENT Provider Note   CSN: [id number] Arrival date & time: 05/14/20  2326     History Chief Complaint  Patient presents with  . Fall    Denise [name] is a 80 y.o. female.  Patient brought to the emergency department from a memory care unit after a fall.  Patient had an unwitnessed fall, was found lying on the ground.  Patient with contusion to right forehead, skin tear to right elbow.  She initially complained of right hip pain.  Patient at her mental status baseline, confused, not answering questions.  Level 5 caveat due to dementia.        No past medical history on file.  There are no problems to display for this patient.      OB History   No obstetric history on file.     No family history on file.     Home Medications Prior to Admission medications   Not on File    Allergies    Codeine and Sulfamethoxazole  Review of Systems   Review of Systems  Unable to perform ROS: Dementia    Physical Exam Updated Vital Signs BP 119/88   Pulse 77   Temp 97.7 F (36.5 C) (Axillary)   Resp 20   SpO2 99%   Physical Exam Vitals and nursing note reviewed.  Constitutional:      General: She is not in acute distress.    Appearance: Normal appearance. She is well-developed.  HENT:     Head: Normocephalic. Contusion present.      Right Ear: Hearing normal.     Left Ear: Hearing normal.     Nose: Nose normal.  Eyes:     Conjunctiva/sclera: Conjunctivae normal.     Pupils: Pupils are equal, round, and reactive to light.  Cardiovascular:     Rate and Rhythm: Regular rhythm.     Heart sounds: S1 normal and S2 normal. No murmur heard. No friction rub. No gallop.   Pulmonary:     Effort: Pulmonary effort is normal. No respiratory distress.     Breath sounds: Normal breath sounds.  Chest:     Chest wall: No tenderness.  Abdominal:     General: Bowel sounds are normal.     Palpations: Abdomen is soft.      Tenderness: There is no abdominal tenderness. There is no guarding or rebound. Negative signs include Murphy's sign and McBurney's sign.     Hernia: No hernia is present.  Musculoskeletal:        General: Normal range of motion.     Cervical back: Normal range of motion and neck supple.  Skin:    General: Skin is warm and dry.     Findings: No rash.     Comments: Skin tear right elbow  Neurological:     Mental Status: She is alert. Mental status is at baseline. She is confused.     GCS: GCS eye subscore is 4. GCS verbal subscore is 4. GCS motor subscore is 6.     Cranial Nerves: No cranial nerve deficit.     Sensory: No sensory deficit.     Coordination: Coordination normal.  Psychiatric:        Cognition and Memory: Memory is impaired.     ED Results / Procedures / Treatments   Labs (all labs ordered are listed, but only abnormal results are displayed) Labs Reviewed  CBC - Abnormal; Notable for the following components:  Result Value   WBC 14.2 (*)    Hemoglobin 10.5 (*)    MCH 23.3 (*)    MCHC 28.2 (*)    RDW 30.9 (*)    All other components within normal limits  BASIC METABOLIC PANEL - Abnormal; Notable for the following components:   Glucose, Bld 169 (*)    All other components within normal limits  LACTIC ACID, PLASMA    EKG EKG Interpretation  Date/Time:  Wednesday May 14 2020 23:38:44 EDT Ventricular Rate:  74 PR Interval:  327 QRS Duration: 99 QT Interval:  433 QTC Calculation: 481 R Axis:   65 Text Interpretation: Sinus rhythm Prolonged PR interval Confirmed by Gilda Crease [phone]) on 05/14/2020 11:58:03 PM   Radiology DG Chest 1 View  Result Date: 05/15/2020 CLINICAL DATA:  Unwitnessed fall. EXAM: CHEST  1 VIEW COMPARISON:  02/09/2015 FINDINGS: Borderline heart size. No vascular congestion, edema, or consolidation. No pleural effusions. No pneumothorax. Mediastinal contours appear intact. Multiple old right rib fractures. IMPRESSION: No  acute cardiopulmonary disease. Electronically Signed   By: Burman Nieves M.D.   On: 05/15/2020 00:26   CT HEAD WO CONTRAST  Result Date: 05/15/2020 CLINICAL DATA:  Level 2 trauma.  Unwitnessed fall.  On Xarelto EXAM: CT HEAD WITHOUT CONTRAST CT CERVICAL SPINE WITHOUT CONTRAST TECHNIQUE: Multidetector CT imaging of the head and cervical spine was performed following the standard protocol without intravenous contrast. Multiplanar CT image reconstructions of the cervical spine were also generated. COMPARISON:  None. FINDINGS: CT HEAD FINDINGS Brain: No evidence of acute infarction, hemorrhage, hydrocephalus, extra-axial collection or mass lesion/mass effect. Diffuse cerebral atrophy. Ventricular dilatation likely due to central atrophy. Low-attenuation changes in the deep white matter likely representing small vessel ischemia. Vascular: Moderate intracranial arterial vascular calcifications. Skull: The calvarium appears intact. Large subcutaneous scalp hematoma over the right anterior frontal region. Sinuses/Orbits: Paranasal sinuses and right mastoid air cells are clear. Partial left mastoid effusions. Other: None. CT CERVICAL SPINE FINDINGS Alignment: Normal alignment. Skull base and vertebrae: Skull base appears intact. No vertebral compression deformities. No focal bone lesion or bone destruction. Degenerative changes in the temporomandibular joints. Soft tissues and spinal canal: No prevertebral soft tissue swelling. No abnormal paraspinal soft tissue mass or infiltration. Disc levels: Degenerative changes with narrowed interspaces and endplate hypertrophic change, most prominent at C5-6 and C6-7 levels. Degenerative changes in the facet joints. Upper chest: Visualized portions of the lung apices are clear. 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 Other: None. IMPRESSION: 1. No acute intracranial abnormalities. Chronic atrophy and small  vessel ischemic changes. 2. Large subcutaneous scalp hematoma over the right anterior frontal region. 3. Normal alignment of the cervical spine. Degenerative changes. No acute displaced fractures identified. Electronically Signed   By: Burman Nieves M.D.   On: 05/15/2020 00:07   CT CERVICAL SPINE WO CONTRAST  Result Date: 05/15/2020 CLINICAL DATA:  Level 2 trauma.  Unwitnessed fall.  On Xarelto EXAM: CT HEAD WITHOUT CONTRAST CT CERVICAL SPINE WITHOUT CONTRAST TECHNIQUE: Multidetector CT imaging of the head and cervical spine was performed following the standard protocol without intravenous contrast. Multiplanar CT image reconstructions of the cervical spine were also generated. COMPARISON:  None. FINDINGS: CT HEAD FINDINGS Brain: No evidence of acute infarction, hemorrhage, hydrocephalus, extra-axial collection or mass lesion/mass effect. Diffuse cerebral atrophy. Ventricular dilatation likely due to central atrophy. Low-attenuation changes in the deep white matter likely representing small vessel ischemia. Vascular: Moderate intracranial arterial  vascular calcifications. Skull: The calvarium appears intact. Large subcutaneous scalp hematoma over the right anterior frontal region. Sinuses/Orbits: Paranasal sinuses and right mastoid air cells are clear. Partial left mastoid effusions. Other: None. CT CERVICAL SPINE FINDINGS Alignment: Normal alignment. Skull base and vertebrae: Skull base appears intact. No vertebral compression deformities. No focal bone lesion or bone destruction. Degenerative changes in the temporomandibular joints. Soft tissues and spinal canal: No prevertebral soft tissue swelling. No abnormal paraspinal soft tissue mass or infiltration. Disc levels: Degenerative changes with narrowed interspaces and endplate hypertrophic change, most prominent at C5-6 and C6-7 levels. Degenerative changes in the facet joints. Upper chest: Visualized portions of the lung apices are clear. Thyroid gland is  partially visualized and appears to be diffusely enlarged without focal nodule, likely goiter. Other: None. IMPRESSION: 1. No acute intracranial abnormalities. Chronic atrophy and small vessel ischemic changes. 2. Large subcutaneous scalp hematoma over the right anterior frontal region. 3. Normal alignment of the cervical spine. Degenerative changes. No acute displaced fractures identified. Electronically Signed   By: Burman NievesWilliam  Stevens M.D.   On: 05/15/2020 00:07   DG HIP UNILAT WITH PELVIS 2-3 VIEWS LEFT  Result Date: 05/15/2020 CLINICAL DATA:  Unwitnessed fall. EXAM: DG HIP (WITH OR WITHOUT PELVIS) 2-3V LEFT COMPARISON:  Pelvis 05/05/2020 FINDINGS: Moderate degenerative changes demonstrated in the left hip. No evidence of acute fracture or dislocation. No focal bone lesion or bone destruction. Prominent vascular calcifications. IMPRESSION: Moderate degenerative changes in the left hip. No acute fracture or dislocation. Electronically Signed   By: Burman NievesWilliam  Stevens M.D.   On: 05/15/2020 00:28   DG HIP UNILAT WITH PELVIS 2-3 VIEWS RIGHT  Result Date: 05/15/2020 CLINICAL DATA:  Unwitnessed fall. EXAM: DG HIP (WITH OR WITHOUT PELVIS) 2-3V RIGHT COMPARISON:  Pelvis 05/05/2020 FINDINGS: Old healed fracture deformities of the right superior and inferior pubic ramus without change since prior study. Degenerative changes in the lower lumbar spine and both hips. No evidence of acute fracture or dislocation of the right hip. No focal bone lesion or bone destruction. SI joints and symphysis pubis are not displaced. Vascular calcifications. IMPRESSION: No acute bony abnormalities. Degenerative changes in the hips. Electronically Signed   By: Burman NievesWilliam  Stevens M.D.   On: 05/15/2020 00:29    Procedures Procedures   Medications Ordered in ED Medications  Tdap (BOOSTRIX) injection 0.5 mL (has no administration in time range)    ED Course  I have reviewed the triage vital signs and the nursing notes.  Pertinent  labs & imaging results that were available during my care of the patient were reviewed by me and considered in my medical decision making (see chart for details).    MDM Rules/Calculators/A&P                          Patient presented to the emergency department after unwitnessed fall.  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  She does not remember the fall.  She has no complaints on arrival but she does have a large hematoma with small laceration to the right forehead.  Patient reportedly initially complained of right hip pain but no longer is complaining of this.  I am able to move both hips through range of motion without any discomfort.  He can also actively move both hips without pain.  X-ray of hips are negative.  Patient underwent CT head and cervical spine because she is on Xarelto.  No abnormalities are noted  other than the soft tissue swelling of the forehead.  Final Clinical Impression(s) / ED Diagnoses Final diagnoses:  Fall, initial encounter  Contusion of face, initial encounter    Rx / DC Orders ED Discharge Orders    None       Denise [name], Canary Brim, MD 05/15/20 [phone]

## 2020-05-14 NOTE — ED Triage Notes (Signed)
Pt BIB [facility] from [facility] memory care unit as level 2 trauma for unwitnessed fall on xarelto. Staff had seen pt, and then 30 min later they found her on the floor laying on the R side. Pt has hematoma to R forehead, skin tear to R elbow. Pt refused c-collar. C/o R arm and R hip pain. Has cast to LLE.

## 2020-05-15 LAB — BASIC METABOLIC PANEL
Anion gap: 10 (ref 5–15)
BUN: 13 mg/dL (ref 8–23)
CO2: 24 mmol/L (ref 22–32)
Calcium: 9.6 mg/dL (ref 8.9–10.3)
Chloride: 102 mmol/L (ref 98–111)
Creatinine, Ser: 0.47 mg/dL (ref 0.44–1.00)
GFR, Estimated: >60 mL/min (ref >60)
Glucose, Bld: 169 mg/dL — ABNORMAL HIGH (ref 70–99)
Potassium: 4.1 mmol/L (ref 3.5–5.1)
Sodium: 136 mmol/L (ref 135–145)

## 2020-05-15 LAB — CBC
HCT: 37.3 % (ref 36.0–46.0)
Hemoglobin: 10.5 g/dL — ABNORMAL LOW (ref 12.0–15.0)
MCH: 23.3 pg — ABNORMAL LOW (ref 26.0–34.0)
MCHC: 28.2 g/dL — ABNORMAL LOW (ref 30.0–36.0)
MCV: 82.7 fL (ref 80.0–100.0)
Platelets: 265 10*3/uL (ref 150–400)
RBC: 4.51 MIL/uL (ref 3.87–5.11)
RDW: 30.9 % — ABNORMAL HIGH (ref 11.5–15.5)
WBC: 14.2 10*3/uL — ABNORMAL HIGH (ref 4.0–10.5)
nRBC: 0 % (ref 0.0–0.2)

## 2020-05-15 LAB — LACTIC ACID, PLASMA: Lactic Acid, Venous: 1.6 mmol/L (ref 0.5–1.9)

## 2020-05-15 MED ORDER — TETANUS-DIPHTH-ACELL PERTUSSIS 5-2.5-18.5 LF-MCG/0.5 IM SUSY
0.5000 mL | PREFILLED_SYRINGE | Freq: Once | INTRAMUSCULAR | Status: AC
  Administered 2020-05-15: 0.5 mL via INTRAMUSCULAR
  Filled 2020-05-15: qty 0.5

## 2020-05-15 NOTE — ED Notes (Signed)
Patient transported to CT w/ RN. 

## 2020-05-15 NOTE — ED Notes (Signed)
Pt returned to rm 31 w/ RN

## 2020-05-15 NOTE — ED Notes (Signed)
Pts face cleaned. Bandage placed over hematoma.

## 2020-05-15 NOTE — ED Notes (Signed)
Attempted report x1 to [facility]

## 2020-05-15 NOTE — ED Notes (Signed)
Patient transported to X-ray from CT w/ RN

## 2020-05-15 NOTE — ED Notes (Signed)
[facility] called  

## 2020-05-15 NOTE — ED Notes (Signed)
Pt ripping off all leads. Ripped out IV as well. Refuses to keep monitoring devices on.

## 2020-06-23 NOTE — Progress Notes (Deleted)
Cardiology Clinic Note   Patient Name: Denise [name] Date of Encounter: 06/23/2020  Primary Care Provider:  Jearld Fenton, NP Primary Cardiologist:  Elouise Munroe, MD  Patient Profile    Denise [name] 80 year old female presents the clinic today for follow-up evaluation after an unwitnessed fall.  Past Medical History    Past Medical History:  Diagnosis Date  . Arthritis   . Asthma   . Carpal tunnel syndrome 09/2006   bilateral   . Cataracts, bilateral 12/2010  . COPD (chronic obstructive pulmonary disease) ([place])   . Diabetes mellitus   . GERD (gastroesophageal reflux disease)   . Hx of atrial fibrillation, no current medication   . Hyperlipidemia   . Hypertension   . Osteopenia   . Plantar fasciitis 2010   Past Surgical History:  Procedure Laterality Date  . BREAST BIOPSY Left 2001   . cardiac ablation in 2001 for a fib    . CARPAL TUNNEL RELEASE Bilateral 09/2006  . colopnoscopy  05/17/2001  . oophorectomy cyst    . ORIF ANKLE FRACTURE Left 05/06/2020   Procedure: OPEN REDUCTION INTERNAL FIXATION (ORIF) BIMALLEOLAR LEFT  ANKLE FRACTURE;  Surgeon: Marchia Bond, MD;  Location: [facility];  Service: Orthopedics;  Laterality: Left;  . TONSILLECTOMY      Allergies  Allergies  Allergen Reactions  . Codeine Other (See Comments)    unknown  . Codeine   . Sulfamethoxazole   . Sulfonamide Derivatives Other (See Comments)    unknown    History of Present Illness    Denise [name] has a PMH of dementia, HTN, paroxysmal A. fib, severe aortic stenosis, asthma, GERD, type 2 diabetes, osteoarthritis, left ankle fracture, HLD, left ankle fracture, and obesity.  She was initially seen by cardiology 05/06/2020 for cardiac evaluation preleft ankle surgery.  At that time she was oriented to name only.  Did not know she was in the hospital.  Did not remember falling.  Did not know why her ankle was broken.  No information was available related to her baseline activity.   She was resting comfortably in bed and noted ankle/foot pain to touch.  She denied shortness of breath, chest pain, and palpitations.  She denied lightheadedness or dizziness and denied any syncope.  She had no known history of ischemia and no CHF on exam.  Her EKG showed no acute changes.  She was noted to have severe AAS on previous echocardiogram.  She was not felt to be a good candidate for aortic interventions due to her dementia.  She was felt to be very high risk for left ankle procedure due to her anemia and severe AS.  She was brought to the emergency department 05/14/2020 and discharged on 05/15/2020 after an unwitnessed fall.  She was found laying on the ground.  She had a contusion on her right forehead and a skin tear on her right elbow.  She was at her mental baseline which is confused.  She initially complained of hip plain.  Bilateral hip x-ray negative.  CT head and C-spine negative.  Soft tissue swelling noted on her forehead.  EKG showed normal sinus rhythm 74 bpm.  She presents to the clinic today for follow-up evaluation states***  *** denies chest pain, shortness of breath, lower extremity edema, fatigue, palpitations, melena, hematuria, hemoptysis, diaphoresis, weakness, presyncope, syncope, orthopnea, and PND.   Home Medications    Prior to Admission medications   Medication Sig Start Date End Date Taking? Authorizing  Provider  acetaminophen (TYLENOL) 500 MG tablet Take 500 mg by mouth every 4 (four) hours as needed for mild pain.    [provider]  acetaminophen (TYLENOL) 500 MG tablet Take 500 mg by mouth every 4 (four) hours as needed for moderate pain.    [provider]  albuterol (PROVENTIL HFA;VENTOLIN HFA) 108 (90 BASE) MCG/ACT inhaler Inhale 2 puffs into the lungs every 6 (six) hours as needed. Patient not taking: No sig reported 10/28/14   Jearld Fenton, NP  albuterol (VENTOLIN HFA) 108 (90 Base) MCG/ACT inhaler Inhale 2 puffs into the lungs every  6 (six) hours as needed. 05/08/20   [provider]  alendronate (FOSAMAX) 70 MG tablet TAKE 1 TABLET BY MOUTH ONCE A WEEK WITH FULL GLASS OF WATER ON EMPTY STOMACH Patient not taking: No sig reported 05/07/15   Jearld Fenton, NP  alendronate (FOSAMAX) 70 MG tablet Take 70 mg by mouth every Wednesday. 05/08/20   [provider]  atorvastatin (LIPITOR) 10 MG tablet Take 10 mg by mouth at bedtime.    [provider]  atorvastatin (LIPITOR) 10 MG tablet Take 10 mg by mouth at bedtime. 04/27/20   [provider]  BD PEN NEEDLE NANO U/F 32G X 4 MM MISC USE DAILY AS DIRECTED 12/31/14   Jearld Fenton, NP  Blood Glucose Monitoring Suppl (ACCU-CHEK NANO SMARTVIEW) W/DEVICE KIT by Does not apply route.    [provider]  calcium carbonate (OSCAL) 1500 (600 Ca) MG TABS tablet Take 600 mg of elemental calcium by mouth 2 (two) times daily with a meal.    [provider]  calcium carbonate (OSCAL) 1500 (600 Ca) MG TABS tablet Take 600 mg of elemental calcium by mouth 2 (two) times daily with a meal.    [provider]  cephALEXin (KEFLEX) 500 MG capsule Take 500 mg by mouth See admin instructions. Bid x 3 days Patient not taking: No sig reported 05/08/20   [provider]  donepezil (ARICEPT) 10 MG tablet Take 10 mg by mouth at bedtime.    [provider]  donepezil (ARICEPT) 10 MG tablet Take 10 mg by mouth at bedtime. 04/27/20   [provider]  Fluticasone-Salmeterol (ADVAIR) 100-50 MCG/DOSE AEPB Inhale 1 puff into the lungs 2 (two) times daily.    [provider]  Fluticasone-Salmeterol (ADVAIR) 100-50 MCG/DOSE AEPB Inhale 1 puff into the lungs daily. 04/25/20   [provider]  glimepiride (AMARYL) 4 MG tablet Take 4 mg by mouth daily with breakfast.    [provider]  glimepiride (AMARYL) 4 MG tablet Take 4 mg by mouth daily. 04/25/20   [provider]  glucose blood (ACCU-CHEK  SMARTVIEW) test strip 1 each by Other route 2 (two) times daily. 11/25/14   Jearld Fenton, NP  insulin lispro (HUMALOG) 100 UNIT/ML injection Inject 8 Units into the skin every morning.    [provider]  insulin lispro (HUMALOG) 100 UNIT/ML KwikPen Inject 8 Units into the skin daily. 05/08/20   [provider]  linagliptin (TRADJENTA) 5 MG TABS tablet Take 1 tablet (5 mg total) by mouth daily. 10/28/14   Jearld Fenton, NP  loperamide (IMODIUM A-D) 2 MG tablet Take 2 mg by mouth daily as needed for diarrhea or loose stools (max of 4 tablets in 24hrs).    [provider]  loperamide (IMODIUM A-D) 2 MG tablet Take 2 mg by mouth as needed for diarrhea or loose stools. Max  4 tabs in 24 hours    [provider]  memantine (NAMENDA) 10 MG tablet Take 10 mg by mouth 2 (two) times daily.    [provider]  memantine (NAMENDA) 10 MG tablet Take 10 mg by mouth 2 (two) times daily. 04/27/20   [provider]  metFORMIN (GLUCOPHAGE) 500 MG tablet Take 500 mg by mouth 2 (two) times daily with a meal.    [provider]  metFORMIN (GLUCOPHAGE) 500 MG tablet Take 500 mg by mouth 2 times daily at 12 noon and 4 pm. 04/17/20   [provider]  Multiple Vitamins-Minerals (CERTAVITE SENIOR PO) Take 1 tablet by mouth daily.    [provider]  Multiple Vitamins-Minerals (CERTAVITE SENIOR) TABS Take 1 tablet by mouth daily.    [provider]  ondansetron (ZOFRAN) 4 MG tablet Take 4 mg by mouth every 8 (eight) hours as needed for nausea or vomiting.    [provider]  ondansetron (ZOFRAN) 4 MG tablet Take 4 mg by mouth every 8 (eight) hours as needed. 04/14/20   [provider]  ramipril (ALTACE) 10 MG capsule Take 1 capsule (10 mg total) by mouth daily. 10/28/14   Jearld Fenton, NP  ramipril (ALTACE) 10 MG capsule Take 10 mg by mouth daily. 04/30/20   [provider]  rivaroxaban (XARELTO) 20 MG TABS  tablet Take 20 mg by mouth daily with supper.    [provider]  sertraline (ZOLOFT) 50 MG tablet Take 50 mg by mouth daily.    [provider]  sertraline (ZOLOFT) 50 MG tablet Take 50 mg by mouth daily. 04/30/20   [provider]  TRADJENTA 5 MG TABS tablet Take 5 mg by mouth daily. 04/30/20   [provider]  XARELTO 20 MG TABS tablet Take 20 mg by mouth daily. 04/24/20   [provider]  Zinc Oxide (DESITIN) 13 % CREA Apply 1 application topically See admin instructions. Apply to bottom every shift    [provider]  Zinc Oxide 13 % CREA Apply 1 application topically See admin instructions. Every shift to bottom    [provider]  Zinc Oxide 13 % CREA Apply 1 application topically 3 (three) times daily as needed (with brief changes for redness).    [provider]    Family History    Family History  Problem Relation Age of Onset  . Heart disease Mother   . Cancer Mother        ovarian  . Diabetes Neg Hx   . Stroke Neg Hx    She indicated that her mother is deceased. She indicated that her father is deceased. She indicated that the status of her neg hx is unknown.  Social History    Social History   Socioeconomic History  . Marital status: Married    Spouse name: Not on file  . Number of children: Not on file  . Years of education: Not on file  . Highest education level: Not on file  Occupational History  . Not on file  Tobacco Use  . Smoking status: Never Smoker  . Smokeless tobacco: Never Used  Substance and Sexual Activity  . Alcohol use: No  . Drug use: No  . Sexual activity: Yes    Partners: Male    Birth control/protection: Post-menopausal  Other Topics Concern  . Not on file  Social History Narrative  . Not on file   Social Determinants of Health  Financial Resource Strain: Not on file  Food Insecurity: Not on file  Transportation Needs: Not on file  Physical Activity: Not on file   Stress: Not on file  Social Connections: Not on file  Intimate Partner Violence: Not on file     Review of Systems    General:  No chills, fever, night sweats or weight changes.  Cardiovascular:  No chest pain, dyspnea on exertion, edema, orthopnea, palpitations, paroxysmal nocturnal dyspnea. Dermatological: No rash, lesions/masses Respiratory: No cough, dyspnea Urologic: No hematuria, dysuria Abdominal:   No nausea, vomiting, diarrhea, bright red blood per rectum, melena, or hematemesis Neurologic:  No visual changes, wkns, changes in mental status. All other systems reviewed and are otherwise negative except as noted above.  Physical Exam    VS:  LMP 01/19/1996  , BMI There is no height or weight on file to calculate BMI. GEN: Well nourished, well developed, in no acute distress. HEENT: normal. Neck: Supple, no JVD, carotid bruits, or masses. Cardiac: RRR, no murmurs, rubs, or gallops. No clubbing, cyanosis, edema.  Radials/DP/PT 2+ and equal bilaterally.  Respiratory:  Respirations regular and unlabored, clear to auscultation bilaterally. GI: Soft, nontender, nondistended, BS + x 4. MS: no deformity or atrophy. Skin: warm and dry, no rash. Neuro:  Strength and sensation are intact. Psych: Normal affect.  Accessory Clinical Findings    Recent Labs: 05/06/2020: ALT 14 05/14/2020: BUN 13; Creatinine, Ser 0.47; Hemoglobin 10.5; Platelets 265; Potassium 4.1; Sodium 136   Recent Lipid Panel    Component Value Date/Time   CHOL 134 04/21/2015 1549   TRIG 118.0 04/21/2015 1549   HDL 50.10 04/21/2015 1549   CHOLHDL 3 04/21/2015 1549   VLDL 23.6 04/21/2015 1549   LDLCALC 60 04/21/2015 1549   LDLDIRECT 118.0 10/24/2014 1536    ECG personally reviewed by me today- *** - No acute changes  Echocardiogram 05/05/2020  IMPRESSIONS    1. There is severe aortic stenosis with AVA 0.8 by continuity equation,  mean gradient 8mHg, peak gradient 726mg, Vmax 4.1759m DI 0.27.   2. There is mild calcific mitral stenosis with mean gradient 8mm59mat HR  74bpm. MVA by continuity equation is 1.2cm2  3. Left ventricular ejection fraction, by estimation, is 60 to 65%. The  left ventricle has normal function. The left ventricle has no regional  wall motion abnormalities. There is moderate concentric left ventricular  hypertrophy. Diastolic function  indeterminant due to severe MAC. Elevated left atrial pressure.  4. Right ventricular systolic function is normal. The right ventricular  size is normal. Tricuspid regurgitation signal is inadequate for assessing  PA pressure.  5. Left atrial size was mildly dilated.  6. The mitral valve is abnormal. Trivial mitral valve regurgitation.  Severe mitral annular calcification.  7. The aortic valve is calcified. There is severe calcifcation of the  aortic valve. There is severe thickening of the aortic valve. Aortic valve  regurgitation is trivial. Severe aortic valve stenosis.   Comparison(s): No prior Echocardiogram.   Assessment & Plan   1.  Severe aortic stenosis- no increased DOE or activity intolerance.  Noted to have severe aortic stenosis on echocardiogram 05/05/2020 EF 60-65%, moderate LVH, left atrium mildly dilated, and trivial mitral valve regurgitation.  Mean gradient 40, peak 69.6 mmHg.  Not a candidate for any procedures. Manage conservatively  Essential hypertension-BP today***. Continue ramipril Heart healthy low-sodium diet-salty 6 given Maintain physical activity  Paroxysmal atrial fibrillation-heart rate today*** Continue Xarelto Heart healthy low-sodium diet-salty 6 given Maintain physical  activity Follows with PCP  Hyperlipidemia-LDL*** Continue atorvastatin Heart healthy low-sodium high-fiber diet Maintain physical activity   Disposition: Follow-up with Dr.Acharya in 3 months.  Jossie Ng. Rayden Dock NP-C    06/23/2020, 10:21 AM [address] Office [phone] Fax [phone]  Notice: This dictation was prepared with Dragon dictation along with smaller phrase technology. Any transcriptional errors that result from this process are unintentional and may not be corrected upon review.  I spent***minutes examining this patient, reviewing medications, and using patient centered shared decision making involving her cardiac care.  Prior to her visit I spent greater than 20 minutes reviewing her past medical history,  medications, and prior cardiac tests.

## 2020-06-24 ENCOUNTER — Ambulatory Visit: Payer: Medicare PPO | Admitting: General Practice

## 2020-10-24 ENCOUNTER — Emergency Department (HOSPITAL_COMMUNITY): Payer: Medicare PPO

## 2020-10-24 ENCOUNTER — Other Ambulatory Visit: Payer: Self-pay

## 2020-10-24 ENCOUNTER — Emergency Department (HOSPITAL_COMMUNITY)
Admission: EM | Admit: 2020-10-24 | Discharge: 2020-10-24 | Disposition: A | Discharge status: Home / Self Care | Payer: Medicare PPO | Attending: Emergency Medicine | Admitting: Emergency Medicine

## 2020-10-24 DIAGNOSIS — S61401A Unspecified open wound of right hand, initial encounter: Secondary | ICD-10-CM | POA: Diagnosis not present

## 2020-10-24 DIAGNOSIS — J4520 Mild intermittent asthma, uncomplicated: Secondary | ICD-10-CM | POA: Insufficient documentation

## 2020-10-24 DIAGNOSIS — I480 Paroxysmal atrial fibrillation: Secondary | ICD-10-CM | POA: Insufficient documentation

## 2020-10-24 DIAGNOSIS — J449 Chronic obstructive pulmonary disease, unspecified: Secondary | ICD-10-CM | POA: Insufficient documentation

## 2020-10-24 DIAGNOSIS — E119 Type 2 diabetes mellitus without complications: Secondary | ICD-10-CM | POA: Insufficient documentation

## 2020-10-24 DIAGNOSIS — I10 Essential (primary) hypertension: Secondary | ICD-10-CM | POA: Insufficient documentation

## 2020-10-24 DIAGNOSIS — Y9301 Activity, walking, marching and hiking: Secondary | ICD-10-CM | POA: Insufficient documentation

## 2020-10-24 DIAGNOSIS — W01198A Fall on same level from slipping, tripping and stumbling with subsequent striking against other object, initial encounter: Secondary | ICD-10-CM | POA: Diagnosis not present

## 2020-10-24 DIAGNOSIS — Z7984 Long term (current) use of oral hypoglycemic drugs: Secondary | ICD-10-CM | POA: Diagnosis not present

## 2020-10-24 DIAGNOSIS — S6991XA Unspecified injury of right wrist, hand and finger(s), initial encounter: Secondary | ICD-10-CM | POA: Diagnosis present

## 2020-10-24 DIAGNOSIS — Z79899 Other long term (current) drug therapy: Secondary | ICD-10-CM | POA: Insufficient documentation

## 2020-10-24 DIAGNOSIS — Z794 Long term (current) use of insulin: Secondary | ICD-10-CM | POA: Insufficient documentation

## 2020-10-24 DIAGNOSIS — Z7951 Long term (current) use of inhaled steroids: Secondary | ICD-10-CM | POA: Insufficient documentation

## 2020-10-24 DIAGNOSIS — Z20822 Contact with and (suspected) exposure to covid-19: Secondary | ICD-10-CM | POA: Diagnosis not present

## 2020-10-24 DIAGNOSIS — Z87828 Personal history of other (healed) physical injury and trauma: Secondary | ICD-10-CM

## 2020-10-24 DIAGNOSIS — Z7901 Long term (current) use of anticoagulants: Secondary | ICD-10-CM | POA: Diagnosis not present

## 2020-10-24 DIAGNOSIS — F0390 Unspecified dementia without behavioral disturbance: Secondary | ICD-10-CM | POA: Insufficient documentation

## 2020-10-24 DIAGNOSIS — W19XXXA Unspecified fall, initial encounter: Secondary | ICD-10-CM

## 2020-10-24 DIAGNOSIS — T1490XA Injury, unspecified, initial encounter: Secondary | ICD-10-CM

## 2020-10-24 LAB — RESP PANEL BY RT-PCR (FLU A&B, COVID) ARPGX2
Influenza A by PCR: NEGATIVE
Influenza B by PCR: NEGATIVE
SARS Coronavirus 2 by RT PCR: NEGATIVE

## 2020-10-24 LAB — BASIC METABOLIC PANEL
Anion gap: 7 (ref 5–15)
BUN: 14 mg/dL (ref 8–23)
CO2: 24 mmol/L (ref 22–32)
Calcium: 9.1 mg/dL (ref 8.9–10.3)
Chloride: 105 mmol/L (ref 98–111)
Creatinine, Ser: 0.53 mg/dL (ref 0.44–1.00)
GFR, Estimated: >60 mL/min (ref >60)
Glucose, Bld: 284 mg/dL — ABNORMAL HIGH (ref 70–99)
Potassium: 4.6 mmol/L (ref 3.5–5.1)
Sodium: 136 mmol/L (ref 135–145)

## 2020-10-24 LAB — I-STAT CHEM 8, ED
BUN: 16 mg/dL (ref 8–23)
Calcium, Ion: 1.15 mmol/L (ref 1.15–1.40)
Chloride: 105 mmol/L (ref 98–111)
Creatinine, Ser: 0.5 mg/dL (ref 0.44–1.00)
Glucose, Bld: 287 mg/dL — ABNORMAL HIGH (ref 70–99)
HCT: 37 % (ref 36.0–46.0)
Hemoglobin: 12.6 g/dL (ref 12.0–15.0)
Potassium: 4.5 mmol/L (ref 3.5–5.1)
Sodium: 139 mmol/L (ref 135–145)
TCO2: 24 mmol/L (ref 22–32)

## 2020-10-24 LAB — SAMPLE TO BLOOD BANK

## 2020-10-24 LAB — PROTIME-INR
INR: 1.3 — ABNORMAL HIGH (ref 0.8–1.2)
Prothrombin Time: 16.3 seconds — ABNORMAL HIGH (ref 11.4–15.2)

## 2020-10-24 LAB — LACTIC ACID, PLASMA: Lactic Acid, Venous: 1.5 mmol/L (ref 0.5–1.9)

## 2020-10-24 MED ORDER — CEPHALEXIN 500 MG PO CAPS: 500.0000 mg | ORAL_CAPSULE | Freq: Three times a day (TID) | ORAL | 0 refills | Status: AC

## 2020-10-24 NOTE — ED Triage Notes (Signed)
Pt had unwitnessed fall at [facility] this morning. Did fall and hit head, small bump to forehead. Facility endorses she is weaker than normal. Hx of dementia.

## 2020-10-24 NOTE — Progress Notes (Signed)
Orthopedic Tech Progress Note Patient Details:  Denise [name] 08/08/1940 [id number]  Patient ID: Laqueta Carina, female   DOB: 1940/12/25, 80 y.o.   MRN: [id number] Level 2 trauma not needed at the moment.  Kendel Bessey L Paz Fuentes 10/24/2020, 10:30 AM

## 2020-10-24 NOTE — ED Notes (Signed)
Patient continues to remove all vital sign monitoring equipment. Will continue to monitor and spot check vitals.

## 2020-10-24 NOTE — ED Notes (Signed)
[facility] called  

## 2020-10-24 NOTE — ED Notes (Signed)
Attempted EKG. Pt pulls wires off each time

## 2020-10-24 NOTE — ED Provider Notes (Signed)
Emergency Medicine Provider Triage Evaluation Note  Denise [name] , a 80 y.o. female  was evaluated in triage.  Pt complains of unwitnessed fall at her facility. ? Head trauma  Patient unable to give hx, hand injury R  Review of Systems  Positive: fall Negative: pain  Physical Exam  BP (!) 153/47 (BP Location: Left Arm)   Pulse (!) 53   Temp (!) 97.5 F (36.4 C) (Oral)   Resp 14   LMP 01/19/1996   SpO2 98%  Gen:   Awake, no distress   Resp:  Normal effort  MSK:   Moves extremities without difficulty  Other:  ? Bump on head vs old scar. Moves all extremities  Medical Decision Making  Medically screening exam initiated at 9:19 AM.  Appropriate orders placed.  Denise [name] was informed that the remainder of the evaluation will be completed by another provider, this initial triage assessment does not replace that evaluation, and the importance of remaining in the ED until their evaluation is complete.   Fall  - head trauma - on blood thinners Level 2    Arthor Captain, PA-C 10/24/20 1610    Ernie Avena, MD 10/24/20 1050

## 2020-10-24 NOTE — ED Provider Notes (Signed)
[facility] EMERGENCY DEPARTMENT Provider Note   CSN: [id number] Arrival date & time: 10/24/20  5643     History Chief Complaint  Patient presents with   Lytle Michaels    Denise [name] is a 80 y.o. female.  HPI     80 year old female with a history of hypertension, hyperlipidemia, atrial fibrillation on Xarelto, diabetes, COPD, severe aortic stenosis, dementia, presents with concern for fall.  Spoke with her cousin's wife Hulan Amato who is her POA and with [place] [facility] denies any acute concerns other than the 2 falls and hand wound. Reports she is normally reliable about areas of pain. Can be combative and aggressive. No acute changes in behavior, appeite, no n/v/ or complaints of pain including cp/abd pain/ha.   Walking with walker, tripped over her walker and hit her head [address] and bumped head Sore on hand after fall last Friday has been there for one week 2nd fall in one week and wanted to have her evaluated 1st fall scraped hand on walker last week    Past Medical History:  Diagnosis Date   Arthritis    Asthma    Carpal tunnel syndrome 09/2006   bilateral    Cataracts, bilateral 12/2010   COPD (chronic obstructive pulmonary disease) ([place])    Diabetes mellitus    GERD (gastroesophageal reflux disease)    Hx of atrial fibrillation, no current medication    Hyperlipidemia    Hypertension    Osteopenia    Plantar fasciitis 2010    Patient Active Problem List   Diagnosis Date Noted   Severe aortic stenosis 05/08/2020   Ankle fracture, left 05/05/2020   Pressure injury of skin 05/05/2020   OAB (overactive bladder) 10/25/2014   Paroxysmal A-fib ([place]) 10/25/2014   Obesity (BMI 30-39.9) 06/08/2013   Dementia ([place]) 06/08/2013   DM type 2 (diabetes mellitus, type 2) ([place]) 01/05/2010   Osteoporosis 04/08/2008   CARPAL TUNNEL SYNDROME, RIGHT 08/22/2006   HLD (hyperlipidemia) 08/19/2006   Essential hypertension 08/19/2006   Mild  intermittent asthma 08/09/2006   GERD 08/09/2006   Osteoarthritis 08/09/2006    Past Surgical History:  Procedure Laterality Date   BREAST BIOPSY Left 2001    cardiac ablation in 2001 for a fib     CARPAL TUNNEL RELEASE Bilateral 09/2006   colopnoscopy  05/17/2001   oophorectomy cyst     ORIF ANKLE FRACTURE Left 05/06/2020   Procedure: OPEN REDUCTION INTERNAL FIXATION (ORIF) BIMALLEOLAR LEFT  ANKLE FRACTURE;  Surgeon: Marchia Bond, MD;  Location: [facility] ORS;  Service: Orthopedics;  Laterality: Left;   TONSILLECTOMY       OB History     Gravida  1   Para      Term      Preterm      AB  1   Living         SAB  1   IAB      Ectopic      Multiple      Live Births              Family History  Problem Relation Age of Onset   Heart disease Mother    Cancer Mother        ovarian   Diabetes Neg Hx    Stroke Neg Hx     Social History   Tobacco Use   Smoking status: Never   Smokeless tobacco: Never  Substance Use Topics   Alcohol use: No  Drug use: No    Home Medications Prior to Admission medications   Medication Sig Start Date End Date Taking? Authorizing Provider  cephALEXin (KEFLEX) 500 MG capsule Take 1 capsule (500 mg total) by mouth 3 (three) times daily for 7 days. 10/24/20 10/31/20 Yes Gareth Morgan, MD  acetaminophen (TYLENOL) 500 MG tablet Take 500 mg by mouth every 4 (four) hours as needed for mild pain.    [provider]  acetaminophen (TYLENOL) 500 MG tablet Take 500 mg by mouth every 4 (four) hours as needed for moderate pain.    [provider]  albuterol (PROVENTIL HFA;VENTOLIN HFA) 108 (90 BASE) MCG/ACT inhaler Inhale 2 puffs into the lungs every 6 (six) hours as needed. Patient not taking: No sig reported 10/28/14   Jearld Fenton, NP  albuterol (VENTOLIN HFA) 108 (90 Base) MCG/ACT inhaler Inhale 2 puffs into the lungs every 6 (six) hours as needed. 05/08/20   [provider]  alendronate (FOSAMAX) 70 MG  tablet TAKE 1 TABLET BY MOUTH ONCE A WEEK WITH FULL GLASS OF WATER ON EMPTY STOMACH Patient not taking: No sig reported 05/07/15   Jearld Fenton, NP  alendronate (FOSAMAX) 70 MG tablet Take 70 mg by mouth every Wednesday. 05/08/20   [provider]  atorvastatin (LIPITOR) 10 MG tablet Take 10 mg by mouth at bedtime.    [provider]  atorvastatin (LIPITOR) 10 MG tablet Take 10 mg by mouth at bedtime. 04/27/20   [provider]  BD PEN NEEDLE NANO U/F 32G X 4 MM MISC USE DAILY AS DIRECTED 12/31/14   Jearld Fenton, NP  Blood Glucose Monitoring Suppl (ACCU-CHEK NANO SMARTVIEW) W/DEVICE KIT by Does not apply route.    [provider]  calcium carbonate (OSCAL) 1500 (600 Ca) MG TABS tablet Take 600 mg of elemental calcium by mouth 2 (two) times daily with a meal.    [provider]  calcium carbonate (OSCAL) 1500 (600 Ca) MG TABS tablet Take 600 mg of elemental calcium by mouth 2 (two) times daily with a meal.    [provider]  donepezil (ARICEPT) 10 MG tablet Take 10 mg by mouth at bedtime.    [provider]  donepezil (ARICEPT) 10 MG tablet Take 10 mg by mouth at bedtime. 04/27/20   [provider]  Fluticasone-Salmeterol (ADVAIR) 100-50 MCG/DOSE AEPB Inhale 1 puff into the lungs 2 (two) times daily.    [provider]  Fluticasone-Salmeterol (ADVAIR) 100-50 MCG/DOSE AEPB Inhale 1 puff into the lungs daily. 04/25/20   [provider]  glimepiride (AMARYL) 4 MG tablet Take 4 mg by mouth daily with breakfast.    [provider]  glimepiride (AMARYL) 4 MG tablet Take 4 mg by mouth daily. 04/25/20   [provider]  glucose blood (ACCU-CHEK SMARTVIEW) test strip 1 each by Other route 2 (two) times daily. 11/25/14   Jearld Fenton, NP  insulin lispro (HUMALOG) 100 UNIT/ML injection Inject 8 Units into the skin every morning.    [provider]  insulin lispro (HUMALOG) 100 UNIT/ML KwikPen  Inject 8 Units into the skin daily. 05/08/20   [provider]  linagliptin (TRADJENTA) 5 MG TABS tablet Take 1 tablet (5 mg total) by mouth daily. 10/28/14   Jearld Fenton, NP  loperamide (IMODIUM A-D) 2 MG tablet Take 2 mg by mouth daily as needed for diarrhea or loose stools (max of 4 tablets in 24hrs).    [provider]  loperamide (IMODIUM A-D) 2 MG tablet Take 2 mg by mouth as needed for diarrhea or loose stools. Max 4 tabs in 24 hours    [provider]  memantine (NAMENDA) 10 MG tablet Take 10 mg by mouth 2 (two) times daily.    [provider]  memantine (NAMENDA) 10 MG tablet Take 10 mg by mouth 2 (two) times daily. 04/27/20   [provider]  metFORMIN (GLUCOPHAGE) 500 MG tablet Take 500 mg by mouth 2 (two) times daily with a meal.    [provider]  metFORMIN (GLUCOPHAGE) 500 MG tablet Take 500 mg by mouth 2 times daily at 12 noon and 4 pm. 04/17/20   [provider]  Multiple Vitamins-Minerals (CERTAVITE SENIOR PO) Take 1 tablet by mouth daily.    [provider]  Multiple Vitamins-Minerals (CERTAVITE SENIOR) TABS Take 1 tablet by mouth daily.    [provider]  ondansetron (ZOFRAN) 4 MG tablet Take 4 mg by mouth every 8 (eight) hours as needed for nausea or vomiting.    [provider]  ondansetron (ZOFRAN) 4 MG tablet Take 4 mg by mouth every 8 (eight) hours as needed. 04/14/20   [provider]  ramipril (ALTACE) 10 MG capsule Take 1 capsule (10 mg total) by mouth daily. 10/28/14   Jearld Fenton, NP  ramipril (ALTACE) 10 MG capsule Take 10 mg by mouth daily. 04/30/20   [provider]  rivaroxaban (XARELTO) 20 MG TABS tablet Take 20 mg by mouth daily with supper.    [provider]  sertraline (ZOLOFT) 50 MG tablet Take 50 mg by mouth daily.    [provider]  sertraline (ZOLOFT) 50 MG tablet Take 50 mg by mouth daily. 04/30/20   [provider]   TRADJENTA 5 MG TABS tablet Take 5 mg by mouth daily. 04/30/20   [provider]  XARELTO 20 MG TABS tablet Take 20 mg by mouth daily. 04/24/20   [provider]  Zinc Oxide (DESITIN) 13 % CREA Apply 1 application topically See admin instructions. Apply to bottom every shift    [provider]  Zinc Oxide 13 % CREA Apply 1 application topically See admin instructions. Every shift to bottom    [provider]  Zinc Oxide 13 % CREA Apply 1 application topically 3 (three) times daily as needed (with brief changes for redness).    [provider]    Allergies    Codeine, Codeine, Sulfamethoxazole, and Sulfonamide derivatives  Review of Systems   Review of Systems  Unable to perform ROS: Dementia  Constitutional:  Negative for fever.  HENT:  Negative for sore throat.   Eyes:  Negative for visual disturbance.  Respiratory:  Negative for cough and shortness of breath.   Cardiovascular:  Negative for chest pain.  Gastrointestinal:  Negative for abdominal pain.  Genitourinary:  Negative for difficulty urinating.  Musculoskeletal:  Negative for back pain and neck pain.  Skin:  Positive for wound. Negative for rash.  Neurological:  Negative for syncope and headaches.   Physical Exam Updated Vital Signs BP (!) 148/76   Pulse (!) 58   Temp 98 F (36.7 C) (Oral)   Resp 14   LMP 01/19/1996   SpO2 95%   Physical Exam Vitals and nursing note reviewed.  Constitutional:      General: She is not in acute distress.    Appearance: She is well-developed. She is not diaphoretic.  HENT:  Head: Normocephalic and atraumatic.  Eyes:     Conjunctiva/sclera: Conjunctivae normal.  Cardiovascular:     Rate and Rhythm: Normal rate and regular rhythm.     Heart sounds: Murmur heard.    No friction rub. No gallop.  Pulmonary:     Effort: Pulmonary effort is normal. No respiratory distress.     Breath sounds: Normal breath sounds. No wheezing or rales.   Chest:     Chest wall: No tenderness.  Abdominal:     General: There is no distension.     Palpations: Abdomen is soft.     Tenderness: There is no abdominal tenderness. There is no guarding.  Musculoskeletal:        General: No tenderness.     Cervical back: Normal range of motion.  Skin:    General: Skin is warm and dry.     Findings: No erythema or rash.     Comments: Wound right hand, mild surrounding erythema  Neurological:     Mental Status: She is alert.     Cranial Nerves: Cranial nerves are intact. No cranial nerve deficit, dysarthria or facial asymmetry.     Sensory: Sensation is intact. No sensory deficit.     Motor: Motor function is intact. No weakness.     Comments: Oriented to self    ED Results / Procedures / Treatments   Labs (all labs ordered are listed, but only abnormal results are displayed) Labs Reviewed  BASIC METABOLIC PANEL - Abnormal; Notable for the following components:      Result Value   Glucose, Bld 284 (*)    All other components within normal limits  PROTIME-INR - Abnormal; Notable for the following components:   Prothrombin Time 16.3 (*)    INR 1.3 (*)    All other components within normal limits  I-STAT CHEM 8, ED - Abnormal; Notable for the following components:   Glucose, Bld 287 (*)    All other components within normal limits  RESP PANEL BY RT-PCR (FLU A&B, COVID) ARPGX2  LACTIC ACID, PLASMA  CBG MONITORING, ED  SAMPLE TO BLOOD BANK    EKG None  Radiology CT HEAD WO CONTRAST  Result Date: 10/24/2020 CLINICAL DATA:  80 year old female status post fall this morning. EXAM: CT HEAD WITHOUT CONTRAST TECHNIQUE: Contiguous axial images were obtained from the base of the skull through the vertex without intravenous contrast. COMPARISON:  Head CT 05/14/2020. FINDINGS: Brain: Stable cerebral volume loss. No midline shift, ventriculomegaly, mass effect, evidence of mass lesion, intracranial hemorrhage or evidence of cortically based acute  infarction. Stable gray-white matter differentiation throughout the brain. No cortical encephalomalacia identified. Vascular: Calcified atherosclerosis at the skull base. Skull: Stable. No fracture identified. Mild motion artifact at the skull base. Sinuses/Orbits: Visualized paranasal sinuses and mastoids are stable and well aerated. Other: No orbit or scalp soft tissue injury identified. IMPRESSION: No acute traumatic injury identified. Stable non contrast CT appearance of the brain. Electronically Signed   By: Genevie Ann M.D.   On: 10/24/2020 11:21   CT CERVICAL SPINE WO CONTRAST  Result Date: 10/24/2020 CLINICAL DATA:  80 year old female status post fall this morning. EXAM: CT CERVICAL SPINE WITHOUT CONTRAST TECHNIQUE: Multidetector CT imaging of the cervical spine was performed without intravenous contrast. Multiplanar CT image reconstructions were also generated. COMPARISON:  CT cervical spine 05/14/2020. FINDINGS: Alignment: Stable cervical lordosis. The C7 level is not entirely included today. Bilateral posterior element alignment is within normal limits. Skull base and vertebrae: Visualized skull  base is intact. No atlanto-occipital dissociation. C1 and C2 appear intact and aligned. No acute osseous abnormality identified. Soft tissues and spinal canal: No prevertebral fluid or swelling. No visible canal hematoma. Calcified carotid atherosclerosis greater on the left. Disc levels: Widespread chronic degenerative cervical spinal stenosis. Chronic C5-C6 ankylosis. Right side C7-T1 facet degeneration and developing ankylosis redemonstrated. Upper chest: Minimally included. IMPRESSION: 1. The C7 level is not completely included but no acute traumatic injury is identified in the cervical spine. 2. Chronic widespread degenerative spinal stenosis. Chronic C5-C6 ankylosis. Electronically Signed   By: Genevie Ann M.D.   On: 10/24/2020 11:25   DG Pelvis Portable  Result Date: 10/24/2020 CLINICAL DATA:  Trauma level  2, fall on blood thinners. EXAM: PORTABLE PELVIS 1-2 VIEWS COMPARISON:  Radiograph 05/05/2020 FINDINGS: Diffuse osteopenia. There is no evidence of acute pelvic fracture or diastasis. Chronic posttraumatic deformity of right pubic body. Severe right and moderate left hip osteoarthritis. No pelvic bone lesions are seen. Vascular calcifications in the pelvis and upper legs. IMPRESSION: Negative. Given diffuse osteopenia, if there is clinical concern for pelvic fracture, recommend further evaluation with cross-sectional imaging. Electronically Signed   By: Ileana Roup M.D.   On: 10/24/2020 10:26   DG Chest Port 1 View  Result Date: 10/24/2020 CLINICAL DATA:  Trauma level 2, fall on blood thinners. EXAM: PORTABLE CHEST 1 VIEW COMPARISON:  Chest radiograph 05/14/2020 FINDINGS: Minimal subsegmental atelectasis at the left lung base. The lungs are otherwise clear. No pneumothorax. Stable cardiomediastinal silhouette. Aortic calcifications. Diffuse osteopenia. Old bilateral rib fractures. Hiatal hernia. IMPRESSION: No acute cardiopulmonary abnormality.  No acute osseous abnormality. Electronically Signed   By: Ileana Roup M.D.   On: 10/24/2020 10:22   DG Hand Complete Right  Result Date: 10/24/2020 CLINICAL DATA:  80 year old female status post fall this morning. EXAM: RIGHT HAND - COMPLETE 3+ VIEW COMPARISON:  None. FINDINGS: Distal radius and ulna appear intact. Widespread carpal bone joint space loss. Probable superimposed Chondrocalcinosis which can be seen in the setting of calcium pyrophosphate deposition disease. But carpal bone alignment appears maintained. Severe 1st CMC joint space loss with subchondral sclerosis and osteophytosis. Less pronounced changes at the 2nd [facility]. Diffuse D IP osteoarthritis also. Metacarpals and phalanges appear intact. IMPRESSION: Degenerative changes throughout the right hand and wrist. No acute fracture or dislocation identified. Electronically Signed   By: Genevie Ann M.D.   On:  10/24/2020 11:15    Procedures Procedures   Medications Ordered in ED Medications - No data to display  ED Course  I have reviewed the triage vital signs and the nursing notes.  Pertinent labs & imaging results that were available during my care of the patient were reviewed by me and considered in my medical decision making (see chart for details).    MDM Rules/Calculators/A&P                            80 year old female with a history of hypertension, hyperlipidemia, atrial fibrillation on Xarelto, diabetes, COPD, severe aortic stenosis, dementia, presents with concern for fall, with 2 falls in one week.  Given concern for other etiologies contributing to more frequent falls, labs ordered at triage, pt also made Level 2 trauma given fall on anticoagulation.  CT head and CSpine performed given dementia on anticoagulation, shows no evidence of acute fracture or abnormalities.  Labs without significant findings. CXR and pelvis without signs of fracture. Does not appear to have focal tenderness  of chest, abdomen, pelvis, or back on exam.  Mild erythema surrounding healing wound to right hand.  Ordered UA however will place on keflex for early wound infection.  Patient discharged in stable condition with understanding of reasons to return.     Final Clinical Impression(s) / ED Diagnoses Final diagnoses:  Trauma  Fall, initial encounter  History of open hand wound    Rx / DC Orders ED Discharge Orders          Ordered    cephALEXin (KEFLEX) 500 MG capsule  3 times daily        10/24/20 1154             Gareth Morgan, MD 10/24/20 2248

## 2020-10-24 NOTE — ED Notes (Signed)
Pt to room 18

## 2020-10-24 NOTE — ED Notes (Signed)
Patient removed IV site. IV replaced and wrapped with coban.

## 2020-10-24 NOTE — ED Notes (Signed)
Patient transported to CT 

## 2020-10-24 NOTE — Progress Notes (Signed)
   10/24/20 0929  Clinical Encounter Type  Visited With Patient not available  Visit Type Trauma  Referral From Nurse  Consult/Referral To Chaplain   Chaplain responded. The patient is being treated by the medical team. There are no support person present. Chaplain remains available for follow-up spiritual / emotion support as needed. This note was prepared by Deneen Harts, M.Div..  For questions please contact by phone [phone].

## 2020-10-24 NOTE — ED Notes (Signed)
Patient removed IV access again. Provider made aware. Per provider, patient does not need IV access at this time.

## 2020-10-24 NOTE — ED Notes (Signed)
Patient transported to X-ray 

## 2023-04-19 DEATH — deceased
